# Patient Record
Sex: Female | Born: 1970 | Race: White | Hispanic: No | Marital: Single | Smoking: Never smoker
Health system: Southern US, Community
[De-identification: ages and names within clinical notes are randomized; demographics above are authoritative.]

## PROBLEM LIST (undated history)

## (undated) DIAGNOSIS — N200 Calculus of kidney: Secondary | ICD-10-CM

## (undated) DIAGNOSIS — G2581 Restless legs syndrome: Secondary | ICD-10-CM

## (undated) DIAGNOSIS — M797 Fibromyalgia: Secondary | ICD-10-CM

## (undated) DIAGNOSIS — R112 Nausea with vomiting, unspecified: Secondary | ICD-10-CM

## (undated) DIAGNOSIS — R51 Headache: Secondary | ICD-10-CM

## (undated) DIAGNOSIS — Z8489 Family history of other specified conditions: Secondary | ICD-10-CM

## (undated) DIAGNOSIS — F32A Depression, unspecified: Secondary | ICD-10-CM

## (undated) DIAGNOSIS — K219 Gastro-esophageal reflux disease without esophagitis: Secondary | ICD-10-CM

## (undated) DIAGNOSIS — Z87442 Personal history of urinary calculi: Secondary | ICD-10-CM

## (undated) DIAGNOSIS — A048 Other specified bacterial intestinal infections: Secondary | ICD-10-CM

## (undated) DIAGNOSIS — Z9889 Other specified postprocedural states: Secondary | ICD-10-CM

## (undated) DIAGNOSIS — R519 Headache, unspecified: Secondary | ICD-10-CM

## (undated) DIAGNOSIS — E039 Hypothyroidism, unspecified: Secondary | ICD-10-CM

## (undated) DIAGNOSIS — N201 Calculus of ureter: Secondary | ICD-10-CM

## (undated) DIAGNOSIS — D649 Anemia, unspecified: Secondary | ICD-10-CM

## (undated) DIAGNOSIS — S069X9A Unspecified intracranial injury with loss of consciousness of unspecified duration, initial encounter: Secondary | ICD-10-CM

## (undated) DIAGNOSIS — F419 Anxiety disorder, unspecified: Secondary | ICD-10-CM

## (undated) DIAGNOSIS — R319 Hematuria, unspecified: Secondary | ICD-10-CM

## (undated) HISTORY — PX: CHOLECYSTECTOMY: SHX55

## (undated) HISTORY — PX: TONSILLECTOMY AND ADENOIDECTOMY: SUR1326

## (undated) HISTORY — PX: ABDOMINAL HYSTERECTOMY: SHX81

---

## 2010-07-19 HISTORY — PX: LAPAROSCOPIC GASTRIC BANDING: SHX1100

## 2012-09-18 HISTORY — PX: URETEROLITHOTOMY: SHX71

## 2013-01-16 HISTORY — PX: LAPAROSCOPIC ASSISTED VAGINAL HYSTERECTOMY: SHX5398

## 2013-03-25 ENCOUNTER — Other Ambulatory Visit: Payer: Self-pay | Admitting: Urology

## 2013-03-26 NOTE — H&P (Signed)
History of Present Illness   Morgan [name] presents today to establish as a new patient here for ongoing issues with nephrolithiasis. Morgan [name] is currently 42 years of age and tells me she estimates 50 clinical stone events in her life. She has required surgical intervention on 2 occasions. Her care initially was primarily in [place], [place] and more recently in [place]. The patient had developed some flank discomfort and hematuria in the fall 2013 and had a CT scan in [place], [place], which apparently showed some renal calculi. She continued to have pain and IVP in [place] revealed a 6 mm midureteral stone. She underwent holmium laser lithotripsy with ureteroscopy by Dr. Salvatore Decent in early November 2013. She had a stent placed, which was subsequently removed. She reports no prior history of any metabolic evaluation despite her young age and frequent stone formation. She tells me in the interim, she was having some pelvic discomfort and was diagnosed with a possible ovarian mass. She underwent a hysterectomy in [place], but no malignancy was noted. She also reports that she was told that she might have a "spot" on her kidney through Dr. Suanne Marker office, but I could find no reports or mention of any concerning renal cyst or renal masses on any notes that have been provided to [place]. The patient tells me she went the other night to the [facility] emergency room having nonspecific suprapubic pressure and discomfort as well as some nonspecific right lower back discomfort. No imaging studies were done at that time. Her urinalysis today does show a small degree of microhematuria and a small amount of pyuria. She continues to have some urgency, frequency, and pressure in her bladder.        Past Medical History Problems  1. History of  Heartburn 787.1 2. History of  Hypothyroidism 244.9  Current Meds 1. HYDROmorphone HCl 2 MG Oral Tablet; Therapy: (Recorded:08May2014) to 2. Synthroid 200 MCG Oral  Tablet; Therapy: (Recorded:08May2014) to 3. Tamsulosin HCl 0.4 MG Oral Capsule; Therapy: (Recorded:08May2014) to  Allergies Medication  1. Clindamycin HCl CAPS 2. Percocet TABS  Family History Problems  1. Maternal history of  Blood In Urine 2. Maternal history of  Cancer 3. Maternal history of  Death In The Family Mother 9yrs, cancer 4. Maternal history of  Diabetes Mellitus V18.0 5. Paternal history of  Family Health Status - Father's Age 67yrs 6. Family history of  Family Health Status Number Of Children 1 daughter 43. Paternal history of  Heart Disease V17.49 8. Maternal history of  Hypertension V17.49 9. Paternal history of  Hypertension V17.49 10. Maternal history of  Nephrolithiasis 11. Maternal history of  Renal Failure  Social History Problems    Caffeine Use 1-2 qd   Marital History - Currently Married   Never A Smoker   Occupation: Public relations account executive Denied    History of  Alcohol Use   History of  Tobacco Use  Review of Systems Genitourinary, constitutional, skin, eye, otolaryngeal, hematologic/lymphatic, cardiovascular, pulmonary, endocrine, musculoskeletal, gastrointestinal, neurological and psychiatric system(s) were reviewed and pertinent findings if present are noted.  Genitourinary: urinary frequency, urinary urgency, pelvic pain and suprapubic pain, but no hematuria.  Gastrointestinal: nausea and constipation.  Constitutional: night sweats.  ENT: sore throat and sinus problems.  Musculoskeletal: back pain.    Vitals Vital Signs [Data Includes: Last 1 Day]  08May2014 09:45AM  BMI Calculated: 37.53 BSA Calculated: 2.1 Height: 5 ft 5.5 in Weight: 228 lb  Blood Pressure: 109 / 64 Temperature: 97.6 F Heart Rate: 89  Physical Exam Constitutional: Well nourished and well developed . No acute distress.  ENT:. The ears and nose are normal in appearance.  Neck: The appearance of the neck is normal and no neck mass is present.  Pulmonary: No  respiratory distress and normal respiratory rhythm and effort.  Cardiovascular: Heart rate and rhythm are normal . No peripheral edema.  Abdomen: The abdomen is soft and nontender. No masses are palpated. No CVA tenderness. No hernias are palpable. No hepatosplenomegaly noted.  Skin: Normal skin turgor, no visible rash and no visible skin lesions.  Neuro/Psych:. Mood and affect are appropriate.    Results/Data Urine [Data Includes: Last 1 Day]   08May2014  COLOR YELLOW   APPEARANCE CLEAR   SPECIFIC GRAVITY 1.025   pH 6.0   GLUCOSE NEG mg/dL  BILIRUBIN NEG   KETONE NEG mg/dL  BLOOD MOD   PROTEIN NEG mg/dL  UROBILINOGEN 0.2 mg/dL  NITRITE NEG   LEUKOCYTE ESTERASE TRACE   SQUAMOUS EPITHELIAL/HPF FEW   WBC 3-6 WBC/hpf  RBC 3-6 RBC/hpf  BACTERIA RARE   CRYSTALS NONE SEEN   CASTS NONE SEEN   Other MUCUS    Assessment Assessed  1. Nephrolithiasis 592.0 2. Microscopic Hematuria 599.72 3. 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 Ureteral Stone 592.1  Plan Hyperactivity Of The Bladder (596.51)  1. AU CT-STONE PROTOCOL  Done: 08May2014 12:00AM  Discussion/Summary   Morgan [name] appears to have a 4-5 mm distal right ureteral calculus on stone protocol imaging today. The official report remains pending, but I have reviewed the films. There appears to be mild obstruction. In addition, she appears to have a tiny calculus in both kidneys that appears to be no bigger than 1-2 mm. She has no immediate indication for intervention. She says that she had fairly significant discomfort last night. We will give her a Toradol shot today since she is having ongoing discomfort. She has been started on Flomax and we will make sure that she has adequate Flomax and pain medication set up until she can achieve followup here. I would encourage her to try to pass the stones spontaneously, but would be happy to intervene if she does not do better or if her clinical situation worsens. Given the current size and  location of the stone, I would recommend ureteroscopy with stone basketing or potential holmium laser lithotripsy with or without stent, depending on how things go. I do think, given her history, it is imperative that she have some metabolic studies once the acute stone event is over. I will set her up to see Lyla Son in the next 1-2 weeks to get followup and see how she is doing clinically. We will then set her up for surgery if needed. I have also provided her with some VESIcare samples to help with some of the suprapubic pressure.      Amendment   After additional discussion, Morgan [name] would really prefer to have something done sooner rather than later. My next availability will be sometime next week. We will tentatively put her on the schedule for cysto with retrograde pyelogram, ureteroscopy and stone basketing or lasering of the stone. If she passes the stone between now and then, we would be delighted to cancel her procedure and I would encourage her to strain her urine faithfully so that we know to cancel the procedure.        Signatures Electronically signed by : Barron Alvine, M.D.; Mar 26 2013  8:16AM

## 2013-03-29 ENCOUNTER — Encounter (HOSPITAL_BASED_OUTPATIENT_CLINIC_OR_DEPARTMENT_OTHER): Payer: Self-pay | Admitting: *Deleted

## 2013-03-29 NOTE — Progress Notes (Signed)
NPO AFTER MN. ARRIVES AT 0845. NEEDS HG. WILL TAKE SYNTHROID AM OF SURG W/ SIP OF WATER AND IF NEEDED TAKE DILAUDID.

## 2013-03-31 ENCOUNTER — Ambulatory Visit (HOSPITAL_BASED_OUTPATIENT_CLINIC_OR_DEPARTMENT_OTHER): Payer: BC Managed Care – PPO | Admitting: Anesthesiology

## 2013-03-31 ENCOUNTER — Ambulatory Visit (HOSPITAL_BASED_OUTPATIENT_CLINIC_OR_DEPARTMENT_OTHER)
Admission: RE | Admit: 2013-03-31 | Discharge: 2013-03-31 | Disposition: A | Discharge status: Home / Self Care | Payer: BC Managed Care – PPO | Source: Ambulatory Visit | Attending: Urology | Admitting: Urology

## 2013-03-31 ENCOUNTER — Encounter (HOSPITAL_BASED_OUTPATIENT_CLINIC_OR_DEPARTMENT_OTHER): Payer: Self-pay

## 2013-03-31 ENCOUNTER — Encounter (HOSPITAL_COMMUNITY): Payer: Self-pay | Admitting: *Deleted

## 2013-03-31 ENCOUNTER — Emergency Department (HOSPITAL_COMMUNITY)
Admission: EM | Admit: 2013-03-31 | Discharge: 2013-03-31 | Disposition: A | Discharge status: Home / Self Care | Payer: BC Managed Care – PPO | Attending: Emergency Medicine | Admitting: Emergency Medicine

## 2013-03-31 ENCOUNTER — Encounter (HOSPITAL_BASED_OUTPATIENT_CLINIC_OR_DEPARTMENT_OTHER): Payer: Self-pay | Admitting: Anesthesiology

## 2013-03-31 ENCOUNTER — Encounter (HOSPITAL_BASED_OUTPATIENT_CLINIC_OR_DEPARTMENT_OTHER)
Admission: RE | Disposition: A | Discharge status: Home / Self Care | Payer: Self-pay | Source: Ambulatory Visit | Attending: Urology

## 2013-03-31 DIAGNOSIS — K219 Gastro-esophageal reflux disease without esophagitis: Secondary | ICD-10-CM | POA: Insufficient documentation

## 2013-03-31 DIAGNOSIS — G2581 Restless legs syndrome: Secondary | ICD-10-CM | POA: Insufficient documentation

## 2013-03-31 DIAGNOSIS — R82998 Other abnormal findings in urine: Secondary | ICD-10-CM | POA: Insufficient documentation

## 2013-03-31 DIAGNOSIS — Z87442 Personal history of urinary calculi: Secondary | ICD-10-CM | POA: Insufficient documentation

## 2013-03-31 DIAGNOSIS — Z90710 Acquired absence of both cervix and uterus: Secondary | ICD-10-CM | POA: Insufficient documentation

## 2013-03-31 DIAGNOSIS — K5900 Constipation, unspecified: Secondary | ICD-10-CM | POA: Insufficient documentation

## 2013-03-31 DIAGNOSIS — E039 Hypothyroidism, unspecified: Secondary | ICD-10-CM | POA: Insufficient documentation

## 2013-03-31 DIAGNOSIS — Z9089 Acquired absence of other organs: Secondary | ICD-10-CM | POA: Insufficient documentation

## 2013-03-31 DIAGNOSIS — R12 Heartburn: Secondary | ICD-10-CM | POA: Insufficient documentation

## 2013-03-31 DIAGNOSIS — N23 Unspecified renal colic: Secondary | ICD-10-CM | POA: Insufficient documentation

## 2013-03-31 DIAGNOSIS — R3129 Other microscopic hematuria: Secondary | ICD-10-CM | POA: Insufficient documentation

## 2013-03-31 DIAGNOSIS — R3915 Urgency of urination: Secondary | ICD-10-CM | POA: Insufficient documentation

## 2013-03-31 DIAGNOSIS — N200 Calculus of kidney: Secondary | ICD-10-CM | POA: Insufficient documentation

## 2013-03-31 DIAGNOSIS — M545 Low back pain, unspecified: Secondary | ICD-10-CM | POA: Insufficient documentation

## 2013-03-31 DIAGNOSIS — N201 Calculus of ureter: Secondary | ICD-10-CM

## 2013-03-31 DIAGNOSIS — R350 Frequency of micturition: Secondary | ICD-10-CM | POA: Insufficient documentation

## 2013-03-31 DIAGNOSIS — Z9884 Bariatric surgery status: Secondary | ICD-10-CM | POA: Insufficient documentation

## 2013-03-31 DIAGNOSIS — Z9889 Other specified postprocedural states: Secondary | ICD-10-CM | POA: Insufficient documentation

## 2013-03-31 DIAGNOSIS — N318 Other neuromuscular dysfunction of bladder: Secondary | ICD-10-CM | POA: Insufficient documentation

## 2013-03-31 DIAGNOSIS — Z79899 Other long term (current) drug therapy: Secondary | ICD-10-CM | POA: Insufficient documentation

## 2013-03-31 HISTORY — PX: HOLMIUM LASER APPLICATION: SHX5852

## 2013-03-31 HISTORY — DX: Gastro-esophageal reflux disease without esophagitis: K21.9

## 2013-03-31 HISTORY — DX: Restless legs syndrome: G25.81

## 2013-03-31 HISTORY — PX: CYSTOSCOPY/RETROGRADE/URETEROSCOPY: SHX5316

## 2013-03-31 HISTORY — DX: Hypothyroidism, unspecified: E03.9

## 2013-03-31 HISTORY — DX: Other specified postprocedural states: R11.2

## 2013-03-31 HISTORY — DX: Personal history of urinary calculi: Z87.442

## 2013-03-31 HISTORY — DX: Calculus of ureter: N20.1

## 2013-03-31 HISTORY — DX: Calculus of kidney: N20.0

## 2013-03-31 HISTORY — DX: Other specified postprocedural states: Z98.890

## 2013-03-31 HISTORY — DX: Hematuria, unspecified: R31.9

## 2013-03-31 LAB — POCT HEMOGLOBIN-HEMACUE: Hemoglobin: 13.6 g/dL (ref 12.0–15.0)

## 2013-03-31 SURGERY — CYSTOSCOPY/RETROGRADE/URETEROSCOPY
Anesthesia: General | Site: Ureter | Laterality: Right | Wound class: Clean Contaminated

## 2013-03-31 MED ORDER — HYDROMORPHONE HCL 2 MG PO TABS: 2.0000 mg | ORAL_TABLET | ORAL | Status: DC | PRN

## 2013-03-31 MED ORDER — URIBEL 118 MG PO CAPS: 1.0000 | ORAL_CAPSULE | Freq: Three times a day (TID) | ORAL | Status: DC | PRN

## 2013-03-31 MED ORDER — DEXAMETHASONE SODIUM PHOSPHATE 4 MG/ML IJ SOLN
INTRAMUSCULAR | Status: DC | PRN
  Administered 2013-03-31: 10 mg via INTRAVENOUS

## 2013-03-31 MED ORDER — HYDROMORPHONE HCL PF 1 MG/ML IJ SOLN
1.0000 mg | Freq: Once | INTRAMUSCULAR | Status: AC
  Administered 2013-03-31: 1 mg via INTRAVENOUS
  Filled 2013-03-31: qty 1

## 2013-03-31 MED ORDER — SCOPOLAMINE 1 MG/3DAYS TD PT72
1.0000 | MEDICATED_PATCH | TRANSDERMAL | Status: DC
  Administered 2013-03-31: 1.5 mg via TRANSDERMAL
  Filled 2013-03-31: qty 1

## 2013-03-31 MED ORDER — KETOROLAC TROMETHAMINE 30 MG/ML IJ SOLN
INTRAMUSCULAR | Status: DC | PRN
  Administered 2013-03-31: 30 mg via INTRAVENOUS

## 2013-03-31 MED ORDER — URELLE 81 MG PO TABS
1.0000 | ORAL_TABLET | Freq: Four times a day (QID) | ORAL | Status: DC
  Administered 2013-03-31: 81 mg via ORAL
  Filled 2013-03-31: qty 1

## 2013-03-31 MED ORDER — SODIUM CHLORIDE 0.9 % IR SOLN
Status: DC | PRN
  Administered 2013-03-31: 6000 mL

## 2013-03-31 MED ORDER — FENTANYL CITRATE 0.05 MG/ML IJ SOLN
INTRAMUSCULAR | Status: DC | PRN
  Administered 2013-03-31: 100 ug via INTRAVENOUS
  Administered 2013-03-31: 50 ug via INTRAVENOUS

## 2013-03-31 MED ORDER — CEFAZOLIN SODIUM 1-5 GM-% IV SOLN
1.0000 g | INTRAVENOUS | Status: DC
  Filled 2013-03-31: qty 50

## 2013-03-31 MED ORDER — HYDROMORPHONE HCL 2 MG PO TABS
2.0000 mg | ORAL_TABLET | Freq: Four times a day (QID) | ORAL | Status: DC | PRN
  Administered 2013-03-31: 2 mg via ORAL
  Filled 2013-03-31: qty 1

## 2013-03-31 MED ORDER — LIDOCAINE HCL (CARDIAC) 20 MG/ML IV SOLN
INTRAVENOUS | Status: DC | PRN
  Administered 2013-03-31: 80 mg via INTRAVENOUS

## 2013-03-31 MED ORDER — CEFAZOLIN SODIUM-DEXTROSE 2-3 GM-% IV SOLR
2.0000 g | INTRAVENOUS | Status: AC
  Administered 2013-03-31: 2 g via INTRAVENOUS
  Filled 2013-03-31: qty 50

## 2013-03-31 MED ORDER — FENTANYL CITRATE 0.05 MG/ML IJ SOLN
25.0000 ug | INTRAMUSCULAR | Status: DC | PRN
  Administered 2013-03-31: 25 ug via INTRAVENOUS
  Administered 2013-03-31: 50 ug via INTRAVENOUS
  Filled 2013-03-31: qty 1

## 2013-03-31 MED ORDER — DIAZEPAM 5 MG/ML IJ SOLN
2.5000 mg | Freq: Once | INTRAMUSCULAR | Status: AC
  Administered 2013-03-31: 2.5 mg via INTRAVENOUS
  Filled 2013-03-31: qty 2

## 2013-03-31 MED ORDER — IOHEXOL 350 MG/ML SOLN
INTRAVENOUS | Status: DC | PRN
  Administered 2013-03-31: 10 mL

## 2013-03-31 MED ORDER — LACTATED RINGERS IV SOLN
INTRAVENOUS | Status: DC
  Administered 2013-03-31 (×2): via INTRAVENOUS
  Filled 2013-03-31: qty 1000

## 2013-03-31 MED ORDER — DIAZEPAM 5 MG PO TABS: 5.0000 mg | ORAL_TABLET | Freq: Four times a day (QID) | ORAL | Status: DC | PRN

## 2013-03-31 MED ORDER — PROMETHAZINE HCL 25 MG/ML IJ SOLN
6.2500 mg | INTRAMUSCULAR | Status: DC | PRN
  Filled 2013-03-31: qty 1

## 2013-03-31 MED ORDER — LIDOCAINE HCL 2 % EX GEL
CUTANEOUS | Status: DC | PRN
  Administered 2013-03-31: 1 via URETHRAL

## 2013-03-31 MED ORDER — ONDANSETRON HCL 4 MG/2ML IJ SOLN
INTRAMUSCULAR | Status: DC | PRN
  Administered 2013-03-31: 4 mg via INTRAVENOUS

## 2013-03-31 MED ORDER — MEPERIDINE HCL 25 MG/ML IJ SOLN
6.2500 mg | INTRAMUSCULAR | Status: DC | PRN
  Filled 2013-03-31: qty 1

## 2013-03-31 MED ORDER — MIDAZOLAM HCL 5 MG/5ML IJ SOLN
INTRAMUSCULAR | Status: DC | PRN
  Administered 2013-03-31: 2 mg via INTRAVENOUS

## 2013-03-31 MED ORDER — PROPOFOL 10 MG/ML IV BOLUS
INTRAVENOUS | Status: DC | PRN
  Administered 2013-03-31: 50 mg via INTRAVENOUS
  Administered 2013-03-31: 200 mg via INTRAVENOUS

## 2013-03-31 MED ORDER — LACTATED RINGERS IV SOLN
INTRAVENOUS | Status: DC
  Filled 2013-03-31: qty 1000

## 2013-03-31 MED ORDER — KETOROLAC TROMETHAMINE 30 MG/ML IJ SOLN
30.0000 mg | Freq: Once | INTRAMUSCULAR | Status: AC
  Administered 2013-03-31: 30 mg via INTRAVENOUS
  Filled 2013-03-31: qty 1

## 2013-03-31 SURGICAL SUPPLY — 31 items
ADAPTER CATH URET PLST 4-6FR (CATHETERS) IMPLANT
BAG DRAIN URO-CYSTO SKYTR STRL (DRAIN) ×2 IMPLANT
BASKET LASER NITINOL 1.9FR (BASKET) IMPLANT
BASKET STNLS GEMINI 4WIRE 3FR (BASKET) IMPLANT
BASKET ZERO TIP NITINOL 2.4FR (BASKET) ×2 IMPLANT
BENZOIN TINCTURE PRP APPL 2/3 (GAUZE/BANDAGES/DRESSINGS) IMPLANT
CANISTER SUCT LVC 12 LTR MEDI- (MISCELLANEOUS) ×2 IMPLANT
CATH INTERMIT  6FR 70CM (CATHETERS) IMPLANT
CATH URET 5FR 28IN CONE TIP (BALLOONS)
CATH URET 5FR 28IN OPEN ENDED (CATHETERS) IMPLANT
CATH URET 5FR 70CM CONE TIP (BALLOONS) IMPLANT
CLOTH BEACON ORANGE TIMEOUT ST (SAFETY) ×2 IMPLANT
DRAPE CAMERA CLOSED 9X96 (DRAPES) ×2 IMPLANT
DRSG TEGADERM 2-3/8X2-3/4 SM (GAUZE/BANDAGES/DRESSINGS) IMPLANT
GLOVE BIO SURGEON STRL SZ7.5 (GLOVE) ×2 IMPLANT
GOWN STRL NON-REIN LRG LVL3 (GOWN DISPOSABLE) ×2 IMPLANT
GOWN STRL REIN XL XLG (GOWN DISPOSABLE) ×2 IMPLANT
GUIDEWIRE 0.038 PTFE COATED (WIRE) IMPLANT
GUIDEWIRE ANG ZIPWIRE 038X150 (WIRE) IMPLANT
GUIDEWIRE STR DUAL SENSOR (WIRE) IMPLANT
IV NS IRRIG 3000ML ARTHROMATIC (IV SOLUTION) ×4 IMPLANT
KIT BALLIN UROMAX 15FX10 (LABEL) IMPLANT
KIT BALLN UROMAX 15FX4 (MISCELLANEOUS) IMPLANT
KIT BALLN UROMAX 26 75X4 (MISCELLANEOUS)
LASER FIBER DISP (UROLOGICAL SUPPLIES) ×2 IMPLANT
LASER FIBER DISP 1000U (UROLOGICAL SUPPLIES) IMPLANT
NS IRRIG 500ML POUR BTL (IV SOLUTION) IMPLANT
PACK CYSTOSCOPY (CUSTOM PROCEDURE TRAY) ×2 IMPLANT
SET HIGH PRES BAL DIL (LABEL)
SHEATH ACCESS URETERAL 38CM (SHEATH) IMPLANT
SHEATH ACCESS URETERAL 54CM (SHEATH) IMPLANT

## 2013-03-31 NOTE — H&P (Signed)
History of Present Illness   Morgan [name] presents today to establish as a new patient here for ongoing issues with nephrolithiasis. Morgan [name] is currently 42 years of age and tells me she estimates 50 clinical stone events in her life. She has required surgical intervention on 2 occasions. Her care initially was primarily in [place], [place] and more recently in [place]. The patient had developed some flank discomfort and hematuria in the fall 2013 and had a CT scan in [place], [place], which apparently showed some renal calculi. She continued to have pain and IVP in [place] revealed a 6 mm midureteral stone. She underwent holmium laser lithotripsy with ureteroscopy by Dr. Caberwal in early November 2013. She had a stent placed, which was subsequently removed. She reports no prior history of any metabolic evaluation despite her young age and frequent stone formation. She tells me in the interim, she was having some pelvic discomfort and was diagnosed with a possible ovarian mass. She underwent a hysterectomy in [place], but no malignancy was noted. She also reports that she was told that she might have a "spot" on her kidney through Dr. Caberwal's office, but I could find no reports or mention of any concerning renal cyst or renal masses on any notes that have been provided to us. The patient tells me she went the other night to the [facility] emergency room having nonspecific suprapubic pressure and discomfort as well as some nonspecific right lower back discomfort. No imaging studies were done at that time. Her urinalysis today does show a small degree of microhematuria and a small amount of pyuria. She continues to have some urgency, frequency, and pressure in her bladder.        Past Medical History Problems  1. History of  Heartburn 787.1 2. History of  Hypothyroidism 244.9  Current Meds 1. HYDROmorphone HCl 2 MG Oral Tablet; Therapy: (Recorded:08May2014) to 2. Synthroid 200 MCG Oral  Tablet; Therapy: (Recorded:08May2014) to 3. Tamsulosin HCl 0.4 MG Oral Capsule; Therapy: (Recorded:08May2014) to  Allergies Medication  1. Clindamycin HCl CAPS 2. Percocet TABS  Family History Problems  1. Maternal history of  Blood In Urine 2. Maternal history of  Cancer 3. Maternal history of  Death In The Family Mother 57yrs, cancer 4. Maternal history of  Diabetes Mellitus V18.0 5. Paternal history of  Family Health Status - Father's Age 72yrs 6. Family history of  Family Health Status Number Of Children 1 daughter 7. Paternal history of  Heart Disease V17.49 8. Maternal history of  Hypertension V17.49 9. Paternal history of  Hypertension V17.49 10. Maternal history of  Nephrolithiasis 11. 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  Genitourinary: urinary frequency, urinary urgency, pelvic pain and suprapubic pain, but no hematuria.  Gastrointestinal: nausea and constipation.  Constitutional: night sweats.  ENT: sore throat and sinus problems.  Musculoskeletal: back pain.    Vitals Vital Signs [Data Includes: Last 1 Day]  08May2014 09:45AM  BMI Calculated: 37.53 BSA Calculated: 2.1 Height: 5 ft 5.5 in Weight: 228 lb  Blood Pressure: 109 / 64 Temperature: 97.6 F Heart Rate: 89    Physical Exam Constitutional: Well nourished and well developed . No acute distress.  ENT:. The ears and nose are normal in appearance.  Neck: The appearance of the neck is normal and no neck mass is present.  Pulmonary: No  respiratory distress and normal respiratory rhythm and effort.  Cardiovascular: Heart rate and rhythm are normal . No peripheral edema.  Abdomen: The abdomen is soft and nontender. No masses are palpated. No CVA tenderness. No hernias are palpable. No hepatosplenomegaly noted.  Skin: Normal skin turgor, no visible rash and no visible skin lesions.  Neuro/Psych:. Mood and affect are appropriate.    Results/Data Urine [Data Includes: Last 1 Day]   08May2014  COLOR YELLOW   APPEARANCE CLEAR   SPECIFIC GRAVITY 1.025   pH 6.0   GLUCOSE NEG mg/dL  BILIRUBIN NEG   KETONE NEG mg/dL  BLOOD MOD   PROTEIN NEG mg/dL  UROBILINOGEN 0.2 mg/dL  NITRITE NEG   LEUKOCYTE ESTERASE TRACE   SQUAMOUS EPITHELIAL/HPF FEW   WBC 3-6 WBC/hpf  RBC 3-6 RBC/hpf  BACTERIA RARE   CRYSTALS NONE SEEN   CASTS NONE SEEN   Other MUCUS    Assessment Assessed  1. Nephrolithiasis 592.0 2. Microscopic Hematuria 599.72 3. 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 Ureteral Stone 592.1  Plan Hyperactivity Of The Bladder (596.51)  1. AU CT-STONE PROTOCOL  Done: 08May2014 12:00AM  Discussion/Summary   Morgan [name] appears to have a 4-5 mm distal right ureteral calculus on stone protocol imaging today. The official report remains pending, but I have reviewed the films. There appears to be mild obstruction. In addition, she appears to have a tiny calculus in both kidneys that appears to be no bigger than 1-2 mm. She has no immediate indication for intervention. She says that she had fairly significant discomfort last night. We will give her a Toradol shot today since she is having ongoing discomfort. She has been started on Flomax and we will make sure that she has adequate Flomax and pain medication set up until she can achieve followup here. I would encourage her to try to pass the stones spontaneously, but would be happy to intervene if she does not do better or if her clinical situation worsens. Given the current size and  location of the stone, I would recommend ureteroscopy with stone basketing or potential holmium laser lithotripsy with or without stent, depending on how things go. I do think, given her history, it is imperative that she have some metabolic studies once the acute stone event is over. I will set her up to see Carrie in the next 1-2 weeks to get followup and see how she is doing clinically. We will then set her up for surgery if needed. I have also provided her with some VESIcare samples to help with some of the suprapubic pressure.      Amendment   After additional discussion, Cherrell would really prefer to have something done sooner rather than later. My next availability will be sometime next week. We will tentatively put her on the schedule for cysto with retrograde pyelogram, ureteroscopy and stone basketing or lasering of the stone. If she passes the stone between now and then, we would be delighted to cancel her procedure and I would encourage her to strain her urine faithfully so that we know to cancel the procedure.        Signatures Electronically signed by : Zailee Vallely, M.D.; Mar 26 2013  8:16AM   

## 2013-03-31 NOTE — Transfer of Care (Signed)
Immediate Anesthesia Transfer of Care Note  Patient: Morgan [name]  Procedure(s) Performed: Procedure(s): CYSTOSCOPY/RETROGRADE/URETEROSCOPY (Right) HOLMIUM LASER APPLICATION (Right)  Patient Location: PACU  Anesthesia Type:General  Level of Consciousness: sedated  Airway & Oxygen Therapy: Patient Spontanous Breathing and Patient connected to face mask oxygen  Post-op Assessment: Report given to PACU RN and Post -op Vital signs reviewed and stable  Post vital signs: Reviewed and stable  Complications: No apparent anesthesia complications

## 2013-03-31 NOTE — ED Notes (Signed)
Pt states had kidney stone surgery today, has been in severe pain since, states has taken 6mg  dilaudid since 12 pm today, having back pain and lower abdominal pain, called doctor's office and was told to come here.

## 2013-03-31 NOTE — Anesthesia Postprocedure Evaluation (Signed)
  Anesthesia Post-op Note  Patient: Morgan [name]  Procedure(s) Performed: Procedure(s) (LRB): CYSTOSCOPY/RETROGRADE/URETEROSCOPY (Right) HOLMIUM LASER APPLICATION (Right)  Patient Location: PACU  Anesthesia Type: General  Level of Consciousness: awake and alert   Airway and Oxygen Therapy: Patient Spontanous Breathing  Post-op Pain: mild  Post-op Assessment: Post-op Vital signs reviewed, Patient's Cardiovascular Status Stable, Respiratory Function Stable, Patent Airway and No signs of Nausea or vomiting  Last Vitals:  Filed Vitals:   03/31/13 1115  BP: 118/91  Pulse: 79  Temp:   Resp: 13    Post-op Vital Signs: stable   Complications: No apparent anesthesia complications

## 2013-03-31 NOTE — Anesthesia Preprocedure Evaluation (Addendum)
Anesthesia Evaluation  Patient identified by MRN, date of birth, ID band Patient awake    Reviewed: Allergy & Precautions, H&P , NPO status , Patient's Chart, lab work & pertinent test results  History of Anesthesia Complications (+) PONV  Airway Mallampati: II TM Distance: >3 FB Neck ROM: Full    Dental no notable dental hx.    Pulmonary neg pulmonary ROS,  breath sounds clear to auscultation  Pulmonary exam normal       Cardiovascular negative cardio ROS  Rhythm:Regular Rate:Normal     Neuro/Psych negative neurological ROS  negative psych ROS   GI/Hepatic negative GI ROS, Neg liver ROS,   Endo/Other  negative endocrine ROS  Renal/GU negative Renal ROS  negative genitourinary   Musculoskeletal negative musculoskeletal ROS (+)   Abdominal   Peds negative pediatric ROS (+)  Hematology negative hematology ROS (+)   Anesthesia Other Findings   Reproductive/Obstetrics negative OB ROS                           Anesthesia Physical Anesthesia Plan  ASA: II  Anesthesia Plan: General   Post-op Pain Management:    Induction: Intravenous  Airway Management Planned: LMA  Additional Equipment:   Intra-op Plan:   Post-operative Plan: Extubation in OR  Informed Consent: I have reviewed the patients History and Physical, chart, labs and discussed the procedure including the risks, benefits and alternatives for the proposed anesthesia with the patient or authorized representative who has indicated his/her understanding and acceptance.   Dental advisory given  Plan Discussed with: CRNA  Anesthesia Plan Comments:         Anesthesia Quick Evaluation  

## 2013-03-31 NOTE — ED Provider Notes (Addendum)
History     CSN: [id number]  Arrival date & time 03/31/13  1634   First MD Initiated Contact with Patient 03/31/13 1657      Chief Complaint  Patient presents with  . Abdominal Pain  . Back Pain    (Consider location/radiation/quality/duration/timing/severity/associated sxs/prior treatment) Patient is a 42 y.o. female presenting with abdominal pain and back pain. The history is provided by the patient.  Abdominal Pain Associated symptoms: no chest pain, no diarrhea, no nausea, no shortness of breath and no vomiting   Back Pain Associated symptoms: abdominal pain   Associated symptoms: no chest pain, no headaches, no numbness and no weakness    patient had lithotripsy earlier today by Dr. Isabel Caprice. She states that she has had pain before the procedure. She states she's having severe pain also after. She states that it got better briefly but then returned on the car ride home. She states is the same type of pain she was having before. It is crampy in the lower abdomen and back. She states the pain is 50 out of 10. No dysuria. Patient states she has to urinate more frequently. Operative note was reviewed and appears to be no complications. No fevers.  Past Medical History  Diagnosis Date  . Right ureteral stone   . RLS (restless legs syndrome)   . Acid reflux   . Constipation PT STATES HAS A TEAR AT RECTAL AREA  . Frequency of urination   . Urgency of urination   . Hematuria   . Renal calculi BILATERAL  . History of kidney stones   . PONV (postoperative nausea and vomiting)   . Hypothyroidism     Past Surgical History  Procedure Laterality Date  . Laparoscopic assisted vaginal hysterectomy  MARCH 2014    W/ BILATERAL SALPINGOOPHORECTOMY  . Cholecystectomy    . Laparoscopic gastric banding  SEPT 2011  . Tonsillectomy and adenoidectomy  AGE 57'S  . Ureterolithotomy  NOV 2013    No family history on file.  History  Substance Use Topics  . Smoking status: Never Smoker   .  Smokeless tobacco: Never Used  . Alcohol Use: No    OB History   Grav Para Term Preterm Abortions TAB SAB Ect Mult Living                  Review of Systems  Constitutional: Negative for activity change and appetite change.  HENT: Negative for neck stiffness.   Eyes: Negative for pain.  Respiratory: Negative for chest tightness and shortness of breath.   Cardiovascular: Negative for chest pain and leg swelling.  Gastrointestinal: Positive for abdominal pain. Negative for nausea, vomiting and diarrhea.  Genitourinary: Positive for frequency. Negative for flank pain.  Musculoskeletal: Positive for back pain.  Skin: Negative for rash.  Neurological: Negative for weakness, numbness and headaches.  Psychiatric/Behavioral: Negative for behavioral problems.    Allergies  Clindamycin/lincomycin and Percocet  Home Medications   Current Outpatient Rx  Name  Route  Sig  Dispense  Refill  . HYDROmorphone (DILAUDID) 2 MG tablet   Oral   Take 2 mg by mouth every 4 (four) hours as needed for pain.         Marland Kitchen levothyroxine (SYNTHROID, LEVOTHROID) 200 MCG tablet   Oral   Take 200 mcg by mouth daily before breakfast.         . Meth-Hyo-M Bl-Na Phos-Ph Sal (URIBEL) 118 MG CAPS   Oral   Take 1 capsule (118 mg total)  by mouth 3 (three) times daily as needed.   15 capsule   1   . OVER THE COUNTER MEDICATION      at bedtime. RESTLESS LEG SUPP. OTC [employer] BRAND         . tamsulosin (FLOMAX) 0.4 MG CAPS   Oral   Take 0.4 mg by mouth daily.         . diazepam (VALIUM) 5 MG tablet   Oral   Take 1 tablet (5 mg total) by mouth every 6 (six) hours as needed (spasm).   10 tablet   0   . HYDROmorphone (DILAUDID) 2 MG tablet   Oral   Take 1 tablet (2 mg total) by mouth every 4 (four) hours as needed for pain.   10 tablet   0     BP 125/79  Pulse 84  Temp(Src) 98.4 F (36.9 C) (Oral)  Resp 18  SpO2 92%  Physical Exam  Nursing note and vitals  reviewed. Constitutional: She is oriented to person, place, and time. She appears well-developed and well-nourished.  HENT:  Head: Normocephalic and atraumatic.  Eyes: EOM are normal. Pupils are equal, round, and reactive to light.  Neck: Normal range of motion. Neck supple.  Cardiovascular: Normal rate, regular rhythm and normal heart sounds.   No murmur heard. Pulmonary/Chest: Effort normal and breath sounds normal. No respiratory distress. She has no wheezes. She has no rales.  Abdominal: Soft. Bowel sounds are normal. She exhibits no distension. There is tenderness. There is no rebound and no guarding.  Tenderness in right lower abdomen without rebound or guarding.  Genitourinary:  Tenderness in right lower back.  Musculoskeletal: Normal range of motion.  Neurological: She is alert and oriented to person, place, and time. No cranial nerve deficit.  Skin: Skin is warm and dry.  Psychiatric: She has a normal mood and affect. Her speech is normal.    ED Course  Procedures (including critical care time)  Labs Reviewed - No data to display No results found.   1. Ureteral colic       MDM  Patient with flank pain after lithotripsy. OR note reviewed. No stent placed. Discussed with urology. Patient was treated with pain medicine and Valium. She feels better and will be discharged home. Likely post procedure spasms.        Juliet Rude. Rubin Payor, MD 03/31/13 1610  Juliet Rude. Rubin Payor, MD 03/31/13 [phone]

## 2013-03-31 NOTE — Op Note (Signed)
Preoperative diagnosis: right ureteral calculus  Postoperative diagnosis:  right  ureteral calculus  Procedure:  1. Cystoscopy 2.  right  ureteroscopy and stone removal 3. Ureteroscopic laser lithotripsy 4.  right  retrograde pyelography with interpretation  Surgeon: Valetta Fuller, MD  Anesthesia: General  Complications: None  Intraoperative findings:  right  retrograde pyelography demonstrated a filling defect within the  right  ureter consistent with the patient's known calculus without other abnormalities.  EBL: Minimal  Specimens: 1.  right  ureteral calculus  Disposition of specimens: [facility] for stone analysis  Indication: Morgan [name] is a 42 y.o.   patient with urolithiasis. After reviewing the management options for treatment, the patient elected to proceed with the above surgical procedure(s). We have discussed the potential benefits and risks of the procedure, side effects of the proposed treatment, the likelihood of the patient achieving the goals of the procedure, and any potential problems that might occur during the procedure or recuperation. Informed consent has been obtained.  Description of procedure:  The patient was taken to the operating room and general anesthesia was induced.  The patient was placed in the dorsal lithotomy position, prepped and draped in the usual sterile fashion, and preoperative antibiotics were administered. A preoperative time-out was performed.   Cystourethroscopy was performed.  The bladder was then systematically examined in its entirety. There was no evidence for any bladder tumors, stones, or other mucosal pathology.    Attention then turned to the  right  ureteral orifice and a ureteral catheter was used to intubate the ureteral orifice.  Omnipaque contrast was injected through the ureteral catheter and a retrograde pyelogram was performed with findings as dictated above.  A 0.38 sensor guidewire was  then advanced up the  right  ureter into the renal pelvis under fluoroscopic guidance. The 6 Fr semirigid ureteroscope was then advanced into the ureter next to the guidewire and the calculus was identified.   The stone was then fragmented with the 365 micron holmium laser fiber on a setting of 0.5J and frequency of 8 Hz.   All stones were then flushed from the ureter.  Reinspection of the ureter revealed no remaining visible stones or fragments.   The bladder was then emptied and the procedure ended.  The patient appeared to tolerate the procedure well and without complications.  The patient was able to be awakened and transferred to the recovery unit in satisfactory condition.

## 2013-04-01 ENCOUNTER — Encounter (HOSPITAL_BASED_OUTPATIENT_CLINIC_OR_DEPARTMENT_OTHER): Payer: Self-pay | Admitting: Urology

## 2013-06-27 ENCOUNTER — Emergency Department (HOSPITAL_COMMUNITY): Payer: BC Managed Care – PPO

## 2013-06-27 ENCOUNTER — Encounter (HOSPITAL_COMMUNITY): Payer: Self-pay | Admitting: *Deleted

## 2013-06-27 ENCOUNTER — Emergency Department (HOSPITAL_COMMUNITY)
Admission: EM | Admit: 2013-06-27 | Discharge: 2013-06-27 | Disposition: A | Discharge status: Home / Self Care | Payer: BC Managed Care – PPO | Attending: Emergency Medicine | Admitting: Emergency Medicine

## 2013-06-27 DIAGNOSIS — R109 Unspecified abdominal pain: Secondary | ICD-10-CM

## 2013-06-27 DIAGNOSIS — Z8719 Personal history of other diseases of the digestive system: Secondary | ICD-10-CM | POA: Insufficient documentation

## 2013-06-27 DIAGNOSIS — Z90710 Acquired absence of both cervix and uterus: Secondary | ICD-10-CM | POA: Insufficient documentation

## 2013-06-27 DIAGNOSIS — Z9089 Acquired absence of other organs: Secondary | ICD-10-CM | POA: Insufficient documentation

## 2013-06-27 DIAGNOSIS — R1031 Right lower quadrant pain: Secondary | ICD-10-CM | POA: Insufficient documentation

## 2013-06-27 DIAGNOSIS — Z9889 Other specified postprocedural states: Secondary | ICD-10-CM | POA: Insufficient documentation

## 2013-06-27 DIAGNOSIS — Z87442 Personal history of urinary calculi: Secondary | ICD-10-CM | POA: Insufficient documentation

## 2013-06-27 DIAGNOSIS — Z79899 Other long term (current) drug therapy: Secondary | ICD-10-CM | POA: Insufficient documentation

## 2013-06-27 DIAGNOSIS — E039 Hypothyroidism, unspecified: Secondary | ICD-10-CM | POA: Insufficient documentation

## 2013-06-27 DIAGNOSIS — R110 Nausea: Secondary | ICD-10-CM | POA: Insufficient documentation

## 2013-06-27 DIAGNOSIS — Z8669 Personal history of other diseases of the nervous system and sense organs: Secondary | ICD-10-CM | POA: Insufficient documentation

## 2013-06-27 DIAGNOSIS — K219 Gastro-esophageal reflux disease without esophagitis: Secondary | ICD-10-CM | POA: Insufficient documentation

## 2013-06-27 LAB — COMPREHENSIVE METABOLIC PANEL
ALT: 22 U/L (ref 0–35)
Alkaline Phosphatase: 121 U/L — ABNORMAL HIGH (ref 39–117)
BUN: 11 mg/dL (ref 6–23)
CO2: 29 mEq/L (ref 19–32)
Chloride: 100 mEq/L (ref 96–112)
GFR calc Af Amer: 83 mL/min — ABNORMAL LOW (ref >90)
GFR calc non Af Amer: 71 mL/min — ABNORMAL LOW (ref >90)
Glucose, Bld: 93 mg/dL (ref 70–99)
Potassium: 3.5 mEq/L (ref 3.5–5.1)
Total Bilirubin: 0.4 mg/dL (ref 0.3–1.2)

## 2013-06-27 LAB — CBC WITH DIFFERENTIAL/PLATELET
Eosinophils Absolute: 0.2 10*3/uL (ref 0.0–0.7)
Hemoglobin: 13.9 g/dL (ref 12.0–15.0)
Lymphocytes Relative: 39 % (ref 12–46)
Lymphs Abs: 3.4 10*3/uL (ref 0.7–4.0)
MCH: 26.8 pg (ref 26.0–34.0)
Monocytes Relative: 9 % (ref 3–12)
Neutro Abs: 4.3 10*3/uL (ref 1.7–7.7)
Neutrophils Relative %: 50 % (ref 43–77)
RBC: 5.18 MIL/uL — ABNORMAL HIGH (ref 3.87–5.11)
WBC: 8.7 10*3/uL (ref 4.0–10.5)

## 2013-06-27 LAB — URINALYSIS, ROUTINE W REFLEX MICROSCOPIC
Bilirubin Urine: NEGATIVE
Ketones, ur: NEGATIVE mg/dL
Nitrite: NEGATIVE
Protein, ur: NEGATIVE mg/dL

## 2013-06-27 MED ORDER — IOHEXOL 300 MG/ML  SOLN
100.0000 mL | Freq: Once | INTRAMUSCULAR | Status: AC | PRN
  Administered 2013-06-27: 100 mL via INTRAVENOUS

## 2013-06-27 MED ORDER — SODIUM CHLORIDE 0.9 % IV SOLN
Freq: Once | INTRAVENOUS | Status: AC
  Administered 2013-06-27: 01:00:00 via INTRAVENOUS

## 2013-06-27 MED ORDER — HYDROMORPHONE HCL PF 1 MG/ML IJ SOLN
1.0000 mg | Freq: Once | INTRAMUSCULAR | Status: AC
  Administered 2013-06-27: 1 mg via INTRAVENOUS
  Filled 2013-06-27: qty 1

## 2013-06-27 MED ORDER — ONDANSETRON 4 MG PO TBDP: 4.0000 mg | ORAL_TABLET | Freq: Three times a day (TID) | ORAL | Status: DC | PRN

## 2013-06-27 MED ORDER — ONDANSETRON HCL 4 MG/2ML IJ SOLN
4.0000 mg | Freq: Once | INTRAMUSCULAR | Status: AC
Start: 2013-06-27 — End: 2013-06-27
  Administered 2013-06-27: 4 mg via INTRAVENOUS
  Filled 2013-06-27: qty 2

## 2013-06-27 MED ORDER — HYDROCODONE-ACETAMINOPHEN 5-325 MG PO TABS: 2.0000 | ORAL_TABLET | ORAL | Status: DC | PRN

## 2013-06-27 MED ORDER — IOHEXOL 300 MG/ML  SOLN
50.0000 mL | Freq: Once | INTRAMUSCULAR | Status: AC | PRN
  Administered 2013-06-27: 50 mL via ORAL

## 2013-06-27 NOTE — ED Notes (Signed)
Pt states she was watching tv about 2 hours ago and began having sharp stabbing pain,  Rates 10/10, denies nausea , vomiting or diarrhea

## 2013-06-27 NOTE — ED Provider Notes (Signed)
CSN: [id number]     Arrival date & time 06/27/13  0001 History     First MD Initiated Contact with Patient 06/27/13 0033     Chief Complaint  Patient presents with  . Abdominal Pain   (Consider location/radiation/quality/duration/timing/severity/associated sxs/prior Treatment) HPI Comments: Patient reports acute onset right lower quadrant pain.  That radiates to her back.  She had initial onset of nausea, with one episode of vomiting, none since, then.  She denies any dysuria.  She has had multiple, surgeries.  She's had a cholecystectomy and complete hysterectomy including ovaries, lap band She has not taken any medication for her discomfort  Patient is a 42 y.o. female presenting with abdominal pain. The history is provided by the patient.  Abdominal Pain Pain location:  RLQ Pain quality: aching and sharp   Pain radiates to:  Does not radiate Pain severity:  Moderate Onset quality:  Sudden Duration:  2 hours Timing:  Constant Progression:  Unchanged Chronicity:  New Context: previous surgery   Context: not alcohol use, not awakening from sleep, not diet changes, not eating, not laxative use, not medication withdrawal, not recent illness, not recent travel, not retching, not sick contacts, not suspicious food intake and not trauma   Relieved by:  None tried Worsened by:  Movement Ineffective treatments:  None tried Associated symptoms: nausea   Associated symptoms: no chest pain, no chills, no constipation, no cough, no diarrhea, no dysuria, no fever, no vaginal bleeding, no vaginal discharge and no vomiting   Risk factors: multiple surgeries and obesity   Risk factors: no alcohol abuse     Past Medical History  Diagnosis Date  . Right ureteral stone   . RLS (restless legs syndrome)   . Acid reflux   . Constipation PT STATES HAS A TEAR AT RECTAL AREA  . Frequency of urination   . Urgency of urination   . Hematuria   . Renal calculi BILATERAL  . History of kidney stones    . PONV (postoperative nausea and vomiting)   . Hypothyroidism    Past Surgical History  Procedure Laterality Date  . Laparoscopic assisted vaginal hysterectomy  MARCH 2014    W/ BILATERAL SALPINGOOPHORECTOMY  . Cholecystectomy    . Laparoscopic gastric banding  SEPT 2011  . Tonsillectomy and adenoidectomy  AGE 58'S  . Ureterolithotomy  NOV 2013  . Cystoscopy/retrograde/ureteroscopy Right 03/31/2013    Procedure: CYSTOSCOPY/RETROGRADE/URETEROSCOPY;  Surgeon: Valetta Fuller, MD;  Location: [facility];  Service: Urology;  Laterality: Right;  . Holmium laser application Right 03/31/2013    Procedure: HOLMIUM LASER APPLICATION;  Surgeon: Valetta Fuller, MD;  Location: [facility];  Service: Urology;  Laterality: Right;   No family history on file. History  Substance Use Topics  . Smoking status: Never Smoker   . Smokeless tobacco: Never Used  . Alcohol Use: No   OB History   Grav Para Term Preterm Abortions TAB SAB Ect Mult Living                 Review of Systems  Constitutional: Negative for fever and chills.  Respiratory: Negative for cough.   Cardiovascular: Negative for chest pain and leg swelling.  Gastrointestinal: Positive for nausea and abdominal pain. Negative for vomiting, diarrhea and constipation.  Genitourinary: Negative for dysuria, vaginal bleeding and vaginal discharge.  Musculoskeletal: Negative for myalgias.  Skin: Negative for rash.  Neurological: Negative for dizziness and headaches.  All other systems reviewed and are negative.  Allergies  Clindamycin/lincomycin and Percocet  Home Medications   Current Outpatient Rx  Name  Route  Sig  Dispense  Refill  . levothyroxine (SYNTHROID, LEVOTHROID) 200 MCG tablet   Oral   Take 200 mcg by mouth every evening.          . sertraline (ZOLOFT) 50 MG tablet   Oral   Take 50 mg by mouth every evening.         . traZODone (DESYREL) 50 MG tablet   Oral   Take 50 mg by  mouth at bedtime.         Marland Kitchen HYDROcodone-acetaminophen (NORCO/VICODIN) 5-325 MG per tablet   Oral   Take 2 tablets by mouth every 4 (four) hours as needed for pain.   10 tablet   0   . ondansetron (ZOFRAN ODT) 4 MG disintegrating tablet   Oral   Take 1 tablet (4 mg total) by mouth every 8 (eight) hours as needed for nausea.   20 tablet   0    BP 145/71  Pulse 77  Temp(Src) 97.8 F (36.6 C) (Oral)  Ht 5\' 4"  (1.626 m)  Wt 205 lb (92.987 kg)  BMI 35.17 kg/m2  SpO2 99% Physical Exam  Nursing note and vitals reviewed. Constitutional: She is oriented to person, place, and time. She appears well-developed and well-nourished.  HENT:  Head: Normocephalic.  Eyes: Pupils are equal, round, and reactive to light.  Neck: Normal range of motion.  Cardiovascular: Normal rate and regular rhythm.   Pulmonary/Chest: Effort normal and breath sounds normal.  Abdominal: Soft. Bowel sounds are normal. She exhibits no distension. There is no hepatosplenomegaly. There is tenderness in the right lower quadrant. There is no CVA tenderness.  Musculoskeletal: Normal range of motion.  Neurological: She is alert and oriented to person, place, and time.  Skin: Skin is warm and dry. No rash noted. No pallor.  Psychiatric: Her behavior is normal.    ED Course   Procedures (including critical care time)  Labs Reviewed  CBC WITH DIFFERENTIAL - Abnormal; Notable for the following:    RBC 5.18 (*)    All other components within normal limits  COMPREHENSIVE METABOLIC PANEL - Abnormal; Notable for the following:    Alkaline Phosphatase 121 (*)    GFR calc non Af Amer 71 (*)    GFR calc Af Amer 83 (*)    All other components within normal limits  URINALYSIS, ROUTINE W REFLEX MICROSCOPIC - Abnormal; Notable for the following:    APPearance CLOUDY (*)    Leukocytes, UA TRACE (*)    All other components within normal limits  URINE MICROSCOPIC-ADD ON   Ct Abdomen Pelvis W Contrast  06/27/2013    *RADIOLOGY REPORT*  Clinical Data: Right lower quadrant pain.  CT ABDOMEN AND PELVIS WITH CONTRAST  Technique:  Multidetector CT imaging of the abdomen and pelvis was performed following the standard protocol during bolus administration of intravenous contrast.  Contrast: 50mL OMNIPAQUE IOHEXOL 300 MG/ML  SOLN, OMNIPAQUE IOHEXOL 300 MG/ML  SOLN  Comparison: 03/25/2013.  Findings:  BODY WALL: Partly imaged bilateral breast implant.  LOWER CHEST:  Mediastinum: Unremarkable.  Lungs/pleura: No consolidation.  ABDOMEN/PELVIS:  Liver: No focal abnormality.  Biliary: Cholecystectomy.  Pancreas: Unremarkable.  Spleen: Unremarkable.  Adrenals: Unremarkable.  Kidneys and ureters: Unchanged distribution of bilateral upper pole nephrolithiasis, measuring 2-3 mm.  There is 12 mm cyst in the right anterior hilar lip.  No hydronephrosis.  Bladder: Unremarkable.  Bowel: Lap band  present.  Unchanged angle, with no evidence of slippage.  No evident perforation.  Unchanged appearance of tubing and reservoir; reservoir left of midline in the central abdomen. Normal appendix. Notably, the cecum is in the central abdomen and the appendix is directed superiorly, located in the midline.  Retroperitoneum: No mass or adenopathy.  Peritoneum: No free fluid or gas.  Reproductive: Hysterectomy.  Vascular: No acute abnormality.  OSSEOUS: No acute abnormalities.  IMPRESSION:  1.  No acute intra-abdominal findings.  Normal appendix. 2.  Unchanged positioning of lap band.  3.  Bilateral nonobstructive nephrolithiasis.   Original Report Authenticated By: Tiburcio Pea   1. Pain, abdominal, nonspecific     MDM   Results of patient's labs, urine, and CT scan, discussed with patient and her husband.  She will be given a prescription for nausea, and pain control, and will followup with her primary care physician.  If she continues to have symptoms  Arman Filter, NP 06/27/13 [phone]

## 2013-06-27 NOTE — ED Provider Notes (Signed)
Medical screening examination/treatment/procedure(s) were performed by non-physician practitioner and as supervising physician I was immediately available for consultation/collaboration.  Amarisa Wilinski, MD 06/27/13 0845 

## 2013-07-13 ENCOUNTER — Emergency Department (HOSPITAL_COMMUNITY)
Admission: EM | Admit: 2013-07-13 | Discharge: 2013-07-14 | Disposition: A | Discharge status: Home / Self Care | Payer: BC Managed Care – PPO | Attending: Emergency Medicine | Admitting: Emergency Medicine

## 2013-07-13 ENCOUNTER — Emergency Department (HOSPITAL_COMMUNITY): Payer: BC Managed Care – PPO

## 2013-07-13 ENCOUNTER — Encounter (HOSPITAL_COMMUNITY): Payer: Self-pay | Admitting: *Deleted

## 2013-07-13 DIAGNOSIS — R300 Dysuria: Secondary | ICD-10-CM | POA: Insufficient documentation

## 2013-07-13 DIAGNOSIS — Z8719 Personal history of other diseases of the digestive system: Secondary | ICD-10-CM | POA: Insufficient documentation

## 2013-07-13 DIAGNOSIS — R3915 Urgency of urination: Secondary | ICD-10-CM | POA: Insufficient documentation

## 2013-07-13 DIAGNOSIS — Z8669 Personal history of other diseases of the nervous system and sense organs: Secondary | ICD-10-CM | POA: Insufficient documentation

## 2013-07-13 DIAGNOSIS — Z9089 Acquired absence of other organs: Secondary | ICD-10-CM | POA: Insufficient documentation

## 2013-07-13 DIAGNOSIS — R110 Nausea: Secondary | ICD-10-CM | POA: Insufficient documentation

## 2013-07-13 DIAGNOSIS — Z9889 Other specified postprocedural states: Secondary | ICD-10-CM | POA: Insufficient documentation

## 2013-07-13 DIAGNOSIS — R109 Unspecified abdominal pain: Secondary | ICD-10-CM | POA: Insufficient documentation

## 2013-07-13 DIAGNOSIS — E039 Hypothyroidism, unspecified: Secondary | ICD-10-CM | POA: Insufficient documentation

## 2013-07-13 DIAGNOSIS — Z79899 Other long term (current) drug therapy: Secondary | ICD-10-CM | POA: Insufficient documentation

## 2013-07-13 DIAGNOSIS — R350 Frequency of micturition: Secondary | ICD-10-CM | POA: Insufficient documentation

## 2013-07-13 DIAGNOSIS — Z87448 Personal history of other diseases of urinary system: Secondary | ICD-10-CM | POA: Insufficient documentation

## 2013-07-13 DIAGNOSIS — N200 Calculus of kidney: Secondary | ICD-10-CM

## 2013-07-13 LAB — BASIC METABOLIC PANEL
CO2: 26 mEq/L (ref 19–32)
Calcium: 9.4 mg/dL (ref 8.4–10.5)
Creatinine, Ser: 1.09 mg/dL (ref 0.50–1.10)
GFR calc Af Amer: 71 mL/min — ABNORMAL LOW (ref >90)
GFR calc non Af Amer: 61 mL/min — ABNORMAL LOW (ref >90)

## 2013-07-13 LAB — URINALYSIS, ROUTINE W REFLEX MICROSCOPIC
Glucose, UA: NEGATIVE mg/dL
Ketones, ur: NEGATIVE mg/dL
Protein, ur: NEGATIVE mg/dL

## 2013-07-13 MED ORDER — HYDROMORPHONE HCL PF 1 MG/ML IJ SOLN
1.0000 mg | Freq: Once | INTRAMUSCULAR | Status: AC
  Administered 2013-07-13: 1 mg via INTRAVENOUS
  Filled 2013-07-13: qty 1

## 2013-07-13 MED ORDER — MORPHINE SULFATE 4 MG/ML IJ SOLN
4.0000 mg | Freq: Once | INTRAMUSCULAR | Status: AC
  Administered 2013-07-13: 4 mg via INTRAVENOUS
  Filled 2013-07-13: qty 1

## 2013-07-13 MED ORDER — SODIUM CHLORIDE 0.9 % IV BOLUS (SEPSIS)
1000.0000 mL | Freq: Once | INTRAVENOUS | Status: AC
  Administered 2013-07-13: 1000 mL via INTRAVENOUS

## 2013-07-13 MED ORDER — ONDANSETRON HCL 4 MG/2ML IJ SOLN
4.0000 mg | Freq: Once | INTRAMUSCULAR | Status: AC
  Administered 2013-07-13: 4 mg via INTRAVENOUS
  Filled 2013-07-13: qty 2

## 2013-07-13 MED ORDER — KETOROLAC TROMETHAMINE 30 MG/ML IJ SOLN
15.0000 mg | Freq: Once | INTRAMUSCULAR | Status: AC
  Administered 2013-07-14: 15 mg via INTRAVENOUS
  Filled 2013-07-13: qty 1

## 2013-07-13 NOTE — ED Notes (Signed)
Pt unable to urinate at this time.  

## 2013-07-13 NOTE — ED Notes (Signed)
Pt states today around 1 pm started having R sided lower back pain and nausea, denies vomiting or diarrhea, states when voided earlier was dark brown color then had difficulty voiding after that, pt states has a hx of kidney stones, states took 2mg  dilaudid PO at 5 pm and did not help with pain.

## 2013-07-13 NOTE — Progress Notes (Signed)
Patient confirms her pcp is Dr. Thomasena Edis of [facility].  System updated.

## 2013-07-14 MED ORDER — HYDROCODONE-ACETAMINOPHEN 7.5-325 MG PO TABS: 1.0000 | ORAL_TABLET | Freq: Four times a day (QID) | ORAL | Status: DC | PRN

## 2013-07-14 MED ORDER — TAMSULOSIN HCL 0.4 MG PO CAPS: 0.4000 mg | ORAL_CAPSULE | Freq: Every day | ORAL | Status: DC

## 2013-07-14 NOTE — ED Provider Notes (Signed)
CSN: [id number]     Arrival date & time 07/13/13  1941 History   First MD Initiated Contact with Patient 07/13/13 2009     Chief Complaint  Patient presents with  . Back Pain  . Nausea  . urinary problems    (Consider location/radiation/quality/duration/timing/severity/associated sxs/prior Treatment) HPI This 42 are old female who presents with right-sided flank pain that radiates into her groin.  She has a history of kidney stones and states that this is very similar. She has had to have lithotripsy in the past. She endorses urinary urgency but difficulty peeing. She says her symptoms started at 1 PM today. She denies any fevers. She describes the pain as dull and waxing and waning. Currently her pain is 8/10. Past Medical History  Diagnosis Date  . Right ureteral stone   . RLS (restless legs syndrome)   . Acid reflux   . Constipation PT STATES HAS A TEAR AT RECTAL AREA  . Frequency of urination   . Urgency of urination   . Hematuria   . Renal calculi BILATERAL  . History of kidney stones   . PONV (postoperative nausea and vomiting)   . Hypothyroidism    Past Surgical History  Procedure Laterality Date  . Laparoscopic assisted vaginal hysterectomy  MARCH 2014    W/ BILATERAL SALPINGOOPHORECTOMY  . Cholecystectomy    . Laparoscopic gastric banding  SEPT 2011  . Tonsillectomy and adenoidectomy  AGE 42'S  . Ureterolithotomy  NOV 2013  . Cystoscopy/retrograde/ureteroscopy Right 03/31/2013    Procedure: CYSTOSCOPY/RETROGRADE/URETEROSCOPY;  Surgeon: Valetta Fuller, MD;  Location: [facility];  Service: Urology;  Laterality: Right;  . Holmium laser application Right 03/31/2013    Procedure: HOLMIUM LASER APPLICATION;  Surgeon: Valetta Fuller, MD;  Location: [facility];  Service: Urology;  Laterality: Right;   No family history on file. History  Substance Use Topics  . Smoking status: Never Smoker   . Smokeless tobacco: Never Used  . Alcohol Use: No    OB History   Grav Para Term Preterm Abortions TAB SAB Ect Mult Living                 Review of Systems  Constitutional: Negative for fever.  Respiratory: Negative for cough, chest tightness and shortness of breath.   Cardiovascular: Negative for chest pain.  Gastrointestinal: Negative for nausea, vomiting and abdominal pain.  Genitourinary: Positive for urgency and frequency. Negative for dysuria.  Musculoskeletal: Positive for back pain.  Skin: Negative for wound.  Neurological: Negative for dizziness and syncope.  Psychiatric/Behavioral: Negative for confusion.  All other systems reviewed and are negative.    Allergies  Clindamycin/lincomycin and Percocet  Home Medications   Current Outpatient Rx  Name  Route  Sig  Dispense  Refill  . HYDROcodone-acetaminophen (NORCO/VICODIN) 5-325 MG per tablet   Oral   Take 2 tablets by mouth every 4 (four) hours as needed for pain.   10 tablet   0   . levothyroxine (SYNTHROID, LEVOTHROID) 175 MCG tablet   Oral   Take 175 mcg by mouth at bedtime.         . ondansetron (ZOFRAN ODT) 4 MG disintegrating tablet   Oral   Take 1 tablet (4 mg total) by mouth every 8 (eight) hours as needed for nausea.   20 tablet   0   . sertraline (ZOLOFT) 50 MG tablet   Oral   Take 50 mg by mouth every evening.         Marland Kitchen  traZODone (DESYREL) 50 MG tablet   Oral   Take 50 mg by mouth at bedtime.         Marland Kitchen HYDROcodone-acetaminophen (NORCO) 7.5-325 MG per tablet   Oral   Take 1 tablet by mouth every 6 (six) hours as needed for pain.   10 tablet   0   . tamsulosin (FLOMAX) 0.4 MG CAPS capsule   Oral   Take 1 capsule (0.4 mg total) by mouth daily.   5 capsule   0    BP 108/43  Pulse 88  Temp(Src) 98.4 F (36.9 C) (Oral)  Resp 20  Ht 5\' 4"  (1.626 m)  Wt 215 lb (97.523 kg)  BMI 36.89 kg/m2 Physical Exam  Nursing note and vitals reviewed. Constitutional: She is oriented to person, place, and time. She appears well-developed  and well-nourished. No distress.  HENT:  Head: Normocephalic and atraumatic.  Eyes: Pupils are equal, round, and reactive to light.  Neck: Neck supple.  Cardiovascular: Normal rate, regular rhythm and normal heart sounds.   Pulmonary/Chest: Effort normal and breath sounds normal. No respiratory distress. She has no wheezes.  Abdominal: Soft. Bowel sounds are normal. There is no tenderness. There is no rebound.  Neurological: She is alert and oriented to person, place, and time.  Skin: Skin is warm and dry.  Psychiatric: She has a normal mood and affect.    ED Course  Procedures (including critical care time) Labs Review Labs Reviewed  BASIC METABOLIC PANEL - Abnormal; Notable for the following:    Glucose, Bld 128 (*)    GFR calc non Af Amer 61 (*)    GFR calc Af Amer 71 (*)    All other components within normal limits  URINALYSIS, ROUTINE W REFLEX MICROSCOPIC - Abnormal; Notable for the following:    Leukocytes, UA SMALL (*)    All other components within normal limits  URINE MICROSCOPIC-ADD ON - Abnormal; Notable for the following:    Crystals CA OXALATE CRYSTALS (*)    All other components within normal limits   Imaging Review Ct Abdomen Pelvis Wo Contrast  07/14/2013   *RADIOLOGY REPORT*  Clinical Data: Abdominal pain  CT ABDOMEN AND PELVIS WITHOUT CONTRAST  Technique:  Multidetector CT imaging of the abdomen and pelvis was performed following the standard protocol without intravenous contrast.  Comparison: 06/27/2013  Findings: Lung bases are predominately clear.  Normal heart size. Gastric band with left paraumbilical subcutaneous reservoir.  Organ abnormality/lesion detection is limited in the absence of intravenous contrast. Within this limitation, unremarkable liver. Absent gallbladder.  No biliary ductal dilatation.  Unremarkable spleen, pancreas, adrenal glands.  Hypodensity right kidney anteriorly is most in keeping with a cyst. Nonobstructing renal stones bilaterally.  No  hydroureteronephrosis. No ureteral calculi.  No overt colitis.  Normal appendix.  Small bowel loops are normal course and caliber.  No free intraperitoneal air.  No lymphadenopathy.  Thin-walled bladder.  Absent uterus.  No adnexal mass. Trace fluid within the pelvis.  No acute osseous finding.  IMPRESSION: No acute abdominopelvic process identified by unenhanced CT.  Nonobstructing renal stones.  Status post gastric banding.  No obstruction.   Original Report Authenticated By: Jearld Lesch, M.D.    MDM   1. Kidney stone on right side     This a 42 year old female who presents with right-sided flank pain that radiates into her right groin. She states this is similar to her prior kidney stones. She is nontoxic-appearing on exam and her vital signs are  within normal limits. BMP and urinalysis were obtained.  Urinalysis shows small leukocytes with calcium oxalate crystals. BMP is within normal limits. Patient was given IV pain medication and it saline bolus. She continued to have pain. She was given Toradol and a CT scan noncontrast is was obtained to evaluate for obstructing kidney stones. This is negative. Patient does have evidence of calcium oxalate crystals in her urine which may represent a recently passed stone.  Patient had improvement of her symptoms in the ER. She will followup with her neurologist. Patient was given Flomax and pain medicine  After history, exam, and medical workup I feel the patient has been appropriately medically screened and is safe for discharge home. Pertinent diagnoses were discussed with the patient. Patient was given return precautions.  Shon Baton, MD 07/14/13 0120

## 2013-08-25 ENCOUNTER — Emergency Department (HOSPITAL_COMMUNITY)
Admission: EM | Admit: 2013-08-25 | Discharge: 2013-08-25 | Disposition: A | Discharge status: Home / Self Care | Payer: BC Managed Care – PPO | Attending: Emergency Medicine | Admitting: Emergency Medicine

## 2013-08-25 ENCOUNTER — Encounter (HOSPITAL_COMMUNITY): Payer: Self-pay | Admitting: Emergency Medicine

## 2013-08-25 DIAGNOSIS — R269 Unspecified abnormalities of gait and mobility: Secondary | ICD-10-CM | POA: Insufficient documentation

## 2013-08-25 DIAGNOSIS — Z8669 Personal history of other diseases of the nervous system and sense organs: Secondary | ICD-10-CM | POA: Insufficient documentation

## 2013-08-25 DIAGNOSIS — Z79899 Other long term (current) drug therapy: Secondary | ICD-10-CM | POA: Insufficient documentation

## 2013-08-25 DIAGNOSIS — M545 Low back pain, unspecified: Secondary | ICD-10-CM | POA: Insufficient documentation

## 2013-08-25 DIAGNOSIS — Z8719 Personal history of other diseases of the digestive system: Secondary | ICD-10-CM | POA: Insufficient documentation

## 2013-08-25 DIAGNOSIS — Z87442 Personal history of urinary calculi: Secondary | ICD-10-CM | POA: Insufficient documentation

## 2013-08-25 DIAGNOSIS — E039 Hypothyroidism, unspecified: Secondary | ICD-10-CM | POA: Insufficient documentation

## 2013-08-25 MED ORDER — HYDROCODONE-ACETAMINOPHEN 5-325 MG PO TABS: 1.0000 | ORAL_TABLET | Freq: Four times a day (QID) | ORAL | Status: DC | PRN

## 2013-08-25 MED ORDER — METHOCARBAMOL 500 MG PO TABS
750.0000 mg | ORAL_TABLET | Freq: Once | ORAL | Status: AC
  Administered 2013-08-25: 750 mg via ORAL
  Filled 2013-08-25: qty 2

## 2013-08-25 MED ORDER — IBUPROFEN 600 MG PO TABS: 600.0000 mg | ORAL_TABLET | Freq: Four times a day (QID) | ORAL | Status: DC | PRN

## 2013-08-25 MED ORDER — METHOCARBAMOL 750 MG PO TABS: 750.0000 mg | ORAL_TABLET | Freq: Three times a day (TID) | ORAL | Status: DC

## 2013-08-25 MED ORDER — KETOROLAC TROMETHAMINE 30 MG/ML IJ SOLN
30.0000 mg | Freq: Once | INTRAMUSCULAR | Status: AC
  Administered 2013-08-25: 30 mg via INTRAMUSCULAR
  Filled 2013-08-25: qty 1

## 2013-08-25 NOTE — ED Notes (Signed)
Discharge delayed due to medication administration.  

## 2013-08-25 NOTE — ED Notes (Signed)
Pt c/o mid back x1 day. Reports pain 10/10. Denies injury.

## 2013-08-25 NOTE — ED Provider Notes (Signed)
CSN: [id number]     Arrival date & time 08/25/13  2011 History  This chart was scribed for Arman Filter, NP,  working with Junius Argyle, MD, by Allene Dillon, ED Scribe. This patient was seen in room WTR9/WTR9 and the patient's care was started at 9:23 PM.    Chief Complaint  Patient presents with  . Back Pain    The history is provided by the patient.   HPI Comments: Morgan [name] is a 42 y.o. female who presents to the Emergency Department complaining of "10/10" sudden onset, constant lower middle back pain which began 12 hours ago. Pt reports pain is radiating to bilateral side of back. Pain worsens with ambulation. Pt took tylenol with no relief. Pt has history of kidney stones but states back pain is not similar to the pain she experienced from her kidney stones. Pt denies weakness or any other symptoms. Pt denies any physical activity, injury, trauma, MVC to cause pain.   Allergies  Allergen Reactions  . Clindamycin/Lincomycin Hives  . Percocet [Oxycodone-Acetaminophen] Nausea And Vomiting and Rash    PCP- Dr. Thomasena Edis  Past Medical History  Diagnosis Date  . Right ureteral stone   . RLS (restless legs syndrome)   . Acid reflux   . Constipation PT STATES HAS A TEAR AT RECTAL AREA  . Frequency of urination   . Urgency of urination   . Hematuria   . Renal calculi BILATERAL  . History of kidney stones   . PONV (postoperative nausea and vomiting)   . Hypothyroidism    Past Surgical History  Procedure Laterality Date  . Laparoscopic assisted vaginal hysterectomy  MARCH 2014    W/ BILATERAL SALPINGOOPHORECTOMY  . Cholecystectomy    . Laparoscopic gastric banding  SEPT 2011  . Tonsillectomy and adenoidectomy  AGE 67'S  . Ureterolithotomy  NOV 2013  . Cystoscopy/retrograde/ureteroscopy Right 03/31/2013    Procedure: CYSTOSCOPY/RETROGRADE/URETEROSCOPY;  Surgeon: Valetta Fuller, MD;  Location: [facility];  Service: Urology;  Laterality: Right;  .  Holmium laser application Right 03/31/2013    Procedure: HOLMIUM LASER APPLICATION;  Surgeon: Valetta Fuller, MD;  Location: [facility];  Service: Urology;  Laterality: Right;   History reviewed. No pertinent family history. History  Substance Use Topics  . Smoking status: Never Smoker   . Smokeless tobacco: Never Used  . Alcohol Use: No   OB History   Grav Para Term Preterm Abortions TAB SAB Ect Mult Living                 Review of Systems  Constitutional: Negative for fever.  Gastrointestinal: Negative for vomiting, abdominal pain and diarrhea.  Genitourinary: Negative for dysuria and flank pain.  Musculoskeletal: Positive for back pain and gait problem. Negative for myalgias.  Skin: Negative for rash and wound.  All other systems reviewed and are negative.    Allergies  Clindamycin/lincomycin and Percocet  Home Medications   Current Outpatient Rx  Name  Route  Sig  Dispense  Refill  . levothyroxine (SYNTHROID, LEVOTHROID) 175 MCG tablet   Oral   Take 175 mcg by mouth at bedtime.         . sertraline (ZOLOFT) 50 MG tablet   Oral   Take 50 mg by mouth every evening.         . traZODone (DESYREL) 50 MG tablet   Oral   Take 50 mg by mouth at bedtime.         Marland Kitchen  tamsulosin (FLOMAX) 0.4 MG CAPS capsule   Oral   Take 1 capsule (0.4 mg total) by mouth daily.   5 capsule   0    Triage Vitals: BP 133/80  Pulse 95  Temp(Src) 98 F (36.7 C) (Oral)  Resp 14  SpO2 98% Physical Exam  Nursing note and vitals reviewed. Constitutional: She appears well-developed and well-nourished.  HENT:  Head: Normocephalic.  Eyes: Pupils are equal, round, and reactive to light.  Neck: Normal range of motion.  Cardiovascular: Normal rate and regular rhythm.   Pulmonary/Chest: Effort normal and breath sounds normal.  Abdominal: Soft.  Musculoskeletal: Normal range of motion. She exhibits tenderness. She exhibits no edema.       Back:  Neurological: She is  alert.    ED Course  Procedures (including critical care time)  DIAGNOSTIC STUDIES: Oxygen Saturation is 98% on RA, normal by my interpretation.    COORDINATION OF CARE: 9:26 PM- Pt advised of plan for treatment which includes pain medication, muscle relaxer and pt agrees.  Medications  ketorolac (TORADOL) 30 MG/ML injection 30 mg (30 mg Intramuscular Given 08/25/13 2146)  methocarbamol (ROBAXIN) tablet 750 mg (750 mg Oral Given 08/25/13 2146)     Labs Review Labs Reviewed - No data to display Imaging Review No results found.  MDM  No diagnosis found.  Low back pain   I personally performed the services described in this documentation, which was scribed in my presence. The recorded information has been reviewed and is accurate.   Arman Filter, NP 08/25/13 2150

## 2013-08-26 NOTE — ED Provider Notes (Signed)
Medical screening examination/treatment/procedure(s) were performed by non-physician practitioner and as supervising physician I was immediately available for consultation/collaboration.   Junius Argyle, MD 08/26/13 1258

## 2013-10-09 ENCOUNTER — Encounter (HOSPITAL_COMMUNITY): Payer: Self-pay | Admitting: Emergency Medicine

## 2013-10-09 ENCOUNTER — Emergency Department (HOSPITAL_COMMUNITY)
Admission: EM | Admit: 2013-10-09 | Discharge: 2013-10-09 | Disposition: A | Discharge status: Home / Self Care | Payer: BC Managed Care – PPO | Attending: Emergency Medicine | Admitting: Emergency Medicine

## 2013-10-09 ENCOUNTER — Emergency Department (HOSPITAL_COMMUNITY): Payer: BC Managed Care – PPO

## 2013-10-09 DIAGNOSIS — R1013 Epigastric pain: Secondary | ICD-10-CM | POA: Insufficient documentation

## 2013-10-09 DIAGNOSIS — E039 Hypothyroidism, unspecified: Secondary | ICD-10-CM | POA: Insufficient documentation

## 2013-10-09 DIAGNOSIS — K219 Gastro-esophageal reflux disease without esophagitis: Secondary | ICD-10-CM | POA: Insufficient documentation

## 2013-10-09 DIAGNOSIS — Z87442 Personal history of urinary calculi: Secondary | ICD-10-CM | POA: Insufficient documentation

## 2013-10-09 DIAGNOSIS — Z8669 Personal history of other diseases of the nervous system and sense organs: Secondary | ICD-10-CM | POA: Insufficient documentation

## 2013-10-09 DIAGNOSIS — Z792 Long term (current) use of antibiotics: Secondary | ICD-10-CM | POA: Insufficient documentation

## 2013-10-09 DIAGNOSIS — Z87448 Personal history of other diseases of urinary system: Secondary | ICD-10-CM | POA: Insufficient documentation

## 2013-10-09 DIAGNOSIS — Z79899 Other long term (current) drug therapy: Secondary | ICD-10-CM | POA: Insufficient documentation

## 2013-10-09 DIAGNOSIS — Z8619 Personal history of other infectious and parasitic diseases: Secondary | ICD-10-CM | POA: Insufficient documentation

## 2013-10-09 HISTORY — DX: Other specified bacterial intestinal infections: A04.8

## 2013-10-09 LAB — POCT I-STAT TROPONIN I: Troponin i, poc: 0 ng/mL (ref 0.00–0.08)

## 2013-10-09 LAB — COMPREHENSIVE METABOLIC PANEL
BUN: 13 mg/dL (ref 6–23)
CO2: 27 mEq/L (ref 19–32)
Calcium: 10.5 mg/dL (ref 8.4–10.5)
Creatinine, Ser: 1.07 mg/dL (ref 0.50–1.10)
GFR calc Af Amer: 72 mL/min — ABNORMAL LOW (ref >90)
GFR calc non Af Amer: 62 mL/min — ABNORMAL LOW (ref >90)
Glucose, Bld: 86 mg/dL (ref 70–99)

## 2013-10-09 LAB — CBC WITH DIFFERENTIAL/PLATELET
Basophils Absolute: 0 10*3/uL (ref 0.0–0.1)
Eosinophils Relative: 3 % (ref 0–5)
HCT: 43.4 % (ref 36.0–46.0)
Lymphocytes Relative: 31 % (ref 12–46)
Lymphs Abs: 2.8 10*3/uL (ref 0.7–4.0)
MCV: 78.8 fL (ref 78.0–100.0)
Monocytes Absolute: 0.6 10*3/uL (ref 0.1–1.0)
RDW: 13.9 % (ref 11.5–15.5)
WBC: 9.3 10*3/uL (ref 4.0–10.5)

## 2013-10-09 MED ORDER — TRAMADOL HCL 50 MG PO TABS: 50.0000 mg | ORAL_TABLET | Freq: Four times a day (QID) | ORAL | Status: DC | PRN

## 2013-10-09 MED ORDER — MORPHINE SULFATE 4 MG/ML IJ SOLN
4.0000 mg | Freq: Once | INTRAMUSCULAR | Status: AC
  Administered 2013-10-09: 4 mg via INTRAVENOUS
  Filled 2013-10-09: qty 1

## 2013-10-09 NOTE — ED Notes (Signed)
Patient is alert and oriented x3.  She is complaining of chest tightness and pressure that radiates to her back. She states that this started earlier this evening and has stayed the same.  Currently she rates her chest pain 9 of 10.

## 2013-10-09 NOTE — ED Provider Notes (Signed)
CSN: [id number]     Arrival date & time 10/09/13  2024 History   First MD Initiated Contact with Patient 10/09/13 2101     Chief Complaint  Patient presents with  . Chest Pain   (Consider location/radiation/quality/duration/timing/severity/associated sxs/prior Treatment) HPI Comments: Patient presents to the emergency department with chief complaint of chest tightness and pressure that radiates to her back. Patient states the pain started this evening. She states that it is a constant pain, and she currently rates the pain 9/10. She states that she thought the pain was related to her H. pylori infection, but her significant other questions or come to the emergency department. She endorses some associated nausea, but denies any shortness of breath or diaphoresis. Her only cardiac risk factors are family history. Nothing makes the pain better or worse. She has not tried anything to alleviate her symptoms.  The history is provided by the patient. No language interpreter was used.    Past Medical History  Diagnosis Date  . Right ureteral stone   . RLS (restless legs syndrome)   . Acid reflux   . Constipation PT STATES HAS A TEAR AT RECTAL AREA  . Frequency of urination   . Urgency of urination   . Hematuria   . Renal calculi BILATERAL  . History of kidney stones   . PONV (postoperative nausea and vomiting)   . Hypothyroidism   . H. pylori infection    Past Surgical History  Procedure Laterality Date  . Laparoscopic assisted vaginal hysterectomy  MARCH 2014    W/ BILATERAL SALPINGOOPHORECTOMY  . Cholecystectomy    . Laparoscopic gastric banding  SEPT 2011  . Tonsillectomy and adenoidectomy  AGE 29'S  . Ureterolithotomy  NOV 2013  . Cystoscopy/retrograde/ureteroscopy Right 03/31/2013    Procedure: CYSTOSCOPY/RETROGRADE/URETEROSCOPY;  Surgeon: Valetta Fuller, MD;  Location: [facility];  Service: Urology;  Laterality: Right;  . Holmium laser application Right 03/31/2013     Procedure: HOLMIUM LASER APPLICATION;  Surgeon: Valetta Fuller, MD;  Location: [facility];  Service: Urology;  Laterality: Right;   History reviewed. No pertinent family history. History  Substance Use Topics  . Smoking status: Never Smoker   . Smokeless tobacco: Never Used  . Alcohol Use: No   OB History   Grav Para Term Preterm Abortions TAB SAB Ect Mult Living                 Review of Systems  All other systems reviewed and are negative.    Allergies  Clindamycin/lincomycin and Percocet  Home Medications   Current Outpatient Rx  Name  Route  Sig  Dispense  Refill  . amoxicillin (AMOXIL) 500 MG capsule   Oral   Take 1,000 mg by mouth 2 (two) times daily.         . clarithromycin (BIAXIN) 500 MG tablet   Oral   Take 500 mg by mouth 2 (two) times daily.         Marland Kitchen ibuprofen (ADVIL,MOTRIN) 600 MG tablet   Oral   Take 1 tablet (600 mg total) by mouth every 6 (six) hours as needed for pain.   30 tablet   0   . lansoprazole (PREVACID) 30 MG capsule   Oral   Take 30 mg by mouth 2 (two) times daily before a meal.         . levothyroxine (SYNTHROID, LEVOTHROID) 150 MCG tablet   Oral   Take 150 mcg by mouth at  bedtime.         . sertraline (ZOLOFT) 100 MG tablet   Oral   Take 100 mg by mouth at bedtime.         . traZODone (DESYREL) 50 MG tablet   Oral   Take 50 mg by mouth at bedtime.          BP 122/70  Pulse 80  Temp(Src) 98.1 F (36.7 C) (Oral)  Resp 13  SpO2 97% Physical Exam  Nursing note and vitals reviewed. Constitutional: She is oriented to person, place, and time. She appears well-developed and well-nourished.  HENT:  Head: Normocephalic and atraumatic.  Eyes: Conjunctivae and EOM are normal. Pupils are equal, round, and reactive to light.  Neck: Normal range of motion. Neck supple.  Cardiovascular: Normal rate and regular rhythm.  Exam reveals no gallop and no friction rub.   No murmur heard. Pulmonary/Chest:  Effort normal and breath sounds normal. No respiratory distress. She has no wheezes. She has no rales. She exhibits no tenderness.  Abdominal: Soft. She exhibits no distension and no mass. There is tenderness. There is no rebound and no guarding.  Epigastric region tender to palpation, no other focal abdominal tenderness, no Murphy's sign, no pain at McBurney's point, no fluid wave, or signs of peritonitis or acute abdomen  Musculoskeletal: Normal range of motion. She exhibits no edema and no tenderness.  Neurological: She is alert and oriented to person, place, and time.  Skin: Skin is warm and dry.  Psychiatric: She has a normal mood and affect. Her behavior is normal. Judgment and thought content normal.    ED Course  Procedures (including critical care time) Results for orders placed during the hospital encounter of 10/09/13  CBC WITH DIFFERENTIAL      Result Value Range   WBC 9.3  4.0 - 10.5 K/uL   RBC 5.51 (*) 3.87 - 5.11 MIL/uL   Hemoglobin 15.0  12.0 - 15.0 g/dL   HCT 16.1  09.6 - 04.5 %   MCV 78.8  78.0 - 100.0 fL   MCH 27.2  26.0 - 34.0 pg   MCHC 34.6  30.0 - 36.0 g/dL   RDW 40.9  81.1 - 91.4 %   Platelets 231  150 - 400 K/uL   Neutrophils Relative % 60  43 - 77 %   Neutro Abs 5.6  1.7 - 7.7 K/uL   Lymphocytes Relative 31  12 - 46 %   Lymphs Abs 2.8  0.7 - 4.0 K/uL   Monocytes Relative 6  3 - 12 %   Monocytes Absolute 0.6  0.1 - 1.0 K/uL   Eosinophils Relative 3  0 - 5 %   Eosinophils Absolute 0.3  0.0 - 0.7 K/uL   Basophils Relative 0  0 - 1 %   Basophils Absolute 0.0  0.0 - 0.1 K/uL  COMPREHENSIVE METABOLIC PANEL      Result Value Range   Sodium 138  135 - 145 mEq/L   Potassium 3.7  3.5 - 5.1 mEq/L   Chloride 99  96 - 112 mEq/L   CO2 27  19 - 32 mEq/L   Glucose, Bld 86  70 - 99 mg/dL   BUN 13  6 - 23 mg/dL   Creatinine, Ser 7.82  0.50 - 1.10 mg/dL   Calcium 95.6  8.4 - 21.3 mg/dL   Total Protein 7.9  6.0 - 8.3 g/dL   Albumin 4.5  3.5 - 5.2 g/dL   AST 18  0 -  37 U/L   ALT 19  0 - 35 U/L   Alkaline Phosphatase 108  39 - 117 U/L   Total Bilirubin 0.6  0.3 - 1.2 mg/dL   GFR calc non Af Amer 62 (*) >90 mL/min   GFR calc Af Amer 72 (*) >90 mL/min  LIPASE, BLOOD      Result Value Range   Lipase 38  11 - 59 U/L  POCT I-STAT TROPONIN I      Result Value Range   Troponin i, poc 0.00  0.00 - 0.08 ng/mL   Comment 3            Dg Chest 2 View  10/09/2013   CLINICAL DATA:  Shortness of breath, chest pain, and cough  EXAM: CHEST  2 VIEW  COMPARISON:  None.  FINDINGS: The heart size and mediastinal contours are within normal limits. Both lungs are clear. The visualized skeletal structures are unremarkable.  IMPRESSION: No active cardiopulmonary disease.   Electronically Signed   By: Esperanza Heir M.D.   On: 10/09/2013 22:05     EKG Interpretation    Date/Time:  Saturday October 09 2013 20:34:50 EST Ventricular Rate:  84 PR Interval:  158 QRS Duration: 80 QT Interval:  354 QTC Calculation: 418 R Axis:   81 Text Interpretation:  Sinus rhythm Normal ECG No previous tracing Confirmed by Denton Lank  MD, KEVIN ([id number]) on 10/09/2013 9:06:17 PM            MDM   1. Epigastric pain     Patient with epigastric abdominal pain. Concern for pancreatitis versus gallbladder disease. Could also be H. pylori related for GERD. Doubt ACS, but the patient's quite concerned about this, so we will check basic labs, and EKG.  EKG is normal.  10:51 PM Labs are reassuring.  Patient has had this pain for some time now.  She states that her doctors have been unable to determine the source.  At this time, I do not believe that there is an acute or emergent condition, and am satisfied with the workup tonight.  I will discharge the patient to home with PCP follow-up. Patient seen by and discussed with Dr. Denton Lank, who agrees with the plan.  Roxy Horseman, PA-C 10/09/13 2252

## 2013-10-09 NOTE — ED Notes (Signed)
Pt c/o left sided chest pain radiating to her back that started a couple of days ago however was intermittent until tonight.  Presented to ED after having BP taken at pharmacy and pharmacist advising her to be seen. Pt was recently dx w/ h.pylori and is on amoxicillin, clarithromycin and lansoprazole.

## 2013-10-11 NOTE — ED Provider Notes (Signed)
Medical screening examination/treatment/procedure(s) were conducted as a shared visit with non-physician practitioner(s) and myself.  I personally evaluated the patient during the encounter.  EKG Interpretation    Date/Time:  Saturday October 09 2013 20:34:50 EST Ventricular Rate:  84 PR Interval:  158 QRS Duration: 80 QT Interval:  354 QTC Calculation: 418 R Axis:   81 Text Interpretation:  Sinus rhythm Normal ECG No previous tracing Confirmed by Shaye Elling  MD, Macel Yearsley ([id number]) on 10/09/2013 9:06:17 PM            Pt c/o epigastric area pain that occasionally feels in back, notes similar pain for past 1+ year for which she has been seen by both pcp and gi. States remote hx lap banding, states all fluid has been removed for past 1+ yrs, and has plans for future surgery to remove altogether. Hx cholecystectomy. Denies fever or chills. abd soft nt. No cp or sob.   Suzi Roots, MD 10/11/13 1051

## 2013-11-04 ENCOUNTER — Ambulatory Visit (INDEPENDENT_AMBULATORY_CARE_PROVIDER_SITE_OTHER): Payer: Self-pay | Admitting: Surgery

## 2013-11-09 ENCOUNTER — Encounter (INDEPENDENT_AMBULATORY_CARE_PROVIDER_SITE_OTHER): Payer: Self-pay | Admitting: Surgery

## 2014-03-15 HISTORY — PX: GASTRIC BYPASS: SHX52

## 2014-06-08 ENCOUNTER — Encounter (HOSPITAL_COMMUNITY): Payer: Self-pay | Admitting: Emergency Medicine

## 2014-06-08 ENCOUNTER — Emergency Department (HOSPITAL_COMMUNITY)
Admission: EM | Admit: 2014-06-08 | Discharge: 2014-06-09 | Disposition: A | Discharge status: Home / Self Care | Payer: BC Managed Care – PPO | Attending: Emergency Medicine | Admitting: Emergency Medicine

## 2014-06-08 DIAGNOSIS — Z79899 Other long term (current) drug therapy: Secondary | ICD-10-CM | POA: Insufficient documentation

## 2014-06-08 DIAGNOSIS — Z87442 Personal history of urinary calculi: Secondary | ICD-10-CM | POA: Insufficient documentation

## 2014-06-08 DIAGNOSIS — E039 Hypothyroidism, unspecified: Secondary | ICD-10-CM | POA: Insufficient documentation

## 2014-06-08 DIAGNOSIS — R110 Nausea: Secondary | ICD-10-CM | POA: Insufficient documentation

## 2014-06-08 DIAGNOSIS — Z8669 Personal history of other diseases of the nervous system and sense organs: Secondary | ICD-10-CM | POA: Insufficient documentation

## 2014-06-08 DIAGNOSIS — N390 Urinary tract infection, site not specified: Secondary | ICD-10-CM | POA: Insufficient documentation

## 2014-06-08 DIAGNOSIS — Z8619 Personal history of other infectious and parasitic diseases: Secondary | ICD-10-CM | POA: Insufficient documentation

## 2014-06-08 DIAGNOSIS — K219 Gastro-esophageal reflux disease without esophagitis: Secondary | ICD-10-CM | POA: Insufficient documentation

## 2014-06-08 DIAGNOSIS — Z90710 Acquired absence of both cervix and uterus: Secondary | ICD-10-CM | POA: Insufficient documentation

## 2014-06-08 DIAGNOSIS — Z9884 Bariatric surgery status: Secondary | ICD-10-CM | POA: Insufficient documentation

## 2014-06-08 DIAGNOSIS — Z9089 Acquired absence of other organs: Secondary | ICD-10-CM | POA: Insufficient documentation

## 2014-06-08 LAB — CBC WITH DIFFERENTIAL/PLATELET
BASOS ABS: 0 10*3/uL (ref 0.0–0.1)
BASOS PCT: 1 % (ref 0–1)
EOS PCT: 5 % (ref 0–5)
Eosinophils Absolute: 0.3 10*3/uL (ref 0.0–0.7)
HCT: 39.2 % (ref 36.0–46.0)
HEMOGLOBIN: 13 g/dL (ref 12.0–15.0)
Lymphocytes Relative: 44 % (ref 12–46)
Lymphs Abs: 3.1 10*3/uL (ref 0.7–4.0)
MCH: 26.9 pg (ref 26.0–34.0)
MCHC: 33.2 g/dL (ref 30.0–36.0)
MCV: 81.2 fL (ref 78.0–100.0)
Monocytes Absolute: 0.6 10*3/uL (ref 0.1–1.0)
Monocytes Relative: 8 % (ref 3–12)
Neutro Abs: 2.9 10*3/uL (ref 1.7–7.7)
Neutrophils Relative %: 42 % — ABNORMAL LOW (ref 43–77)
Platelets: 185 10*3/uL (ref 150–400)
RBC: 4.83 MIL/uL (ref 3.87–5.11)
RDW: 14.3 % (ref 11.5–15.5)
WBC: 6.9 10*3/uL (ref 4.0–10.5)

## 2014-06-08 NOTE — ED Notes (Signed)
Patient drinking Gatorade upon entering room Patient in NAD

## 2014-06-08 NOTE — ED Notes (Signed)
Patient able to ambulate to bathroom without difficulty or assistance from nursing staff--gait steady Patient in NAD

## 2014-06-08 NOTE — ED Notes (Signed)
Pt has hx of kidney stones. States she is having rt sided flank pain and decreased urine output.

## 2014-06-09 ENCOUNTER — Emergency Department (HOSPITAL_COMMUNITY): Payer: BC Managed Care – PPO

## 2014-06-09 LAB — URINALYSIS, ROUTINE W REFLEX MICROSCOPIC
BILIRUBIN URINE: NEGATIVE
Glucose, UA: NEGATIVE mg/dL
Hgb urine dipstick: NEGATIVE
KETONES UR: NEGATIVE mg/dL
NITRITE: NEGATIVE
Protein, ur: NEGATIVE mg/dL
SPECIFIC GRAVITY, URINE: 1.024 (ref 1.005–1.030)
Urobilinogen, UA: 0.2 mg/dL (ref 0.0–1.0)
pH: 6 (ref 5.0–8.0)

## 2014-06-09 LAB — COMPREHENSIVE METABOLIC PANEL
ALBUMIN: 4.4 g/dL (ref 3.5–5.2)
ALT: 21 U/L (ref 0–35)
ANION GAP: 15 (ref 5–15)
AST: 23 U/L (ref 0–37)
Alkaline Phosphatase: 101 U/L (ref 39–117)
BUN: 18 mg/dL (ref 6–23)
CALCIUM: 9.7 mg/dL (ref 8.4–10.5)
CO2: 25 mEq/L (ref 19–32)
CREATININE: 1 mg/dL (ref 0.50–1.10)
Chloride: 102 mEq/L (ref 96–112)
GFR calc Af Amer: 78 mL/min — ABNORMAL LOW (ref >90)
GFR, EST NON AFRICAN AMERICAN: 67 mL/min — AB (ref >90)
Glucose, Bld: 107 mg/dL — ABNORMAL HIGH (ref 70–99)
Potassium: 3.8 mEq/L (ref 3.7–5.3)
Sodium: 142 mEq/L (ref 137–147)
Total Bilirubin: 0.6 mg/dL (ref 0.3–1.2)
Total Protein: 7.5 g/dL (ref 6.0–8.3)

## 2014-06-09 LAB — URINE MICROSCOPIC-ADD ON

## 2014-06-09 LAB — LIPASE, BLOOD: LIPASE: 45 U/L (ref 11–59)

## 2014-06-09 MED ORDER — MORPHINE SULFATE 4 MG/ML IJ SOLN
4.0000 mg | Freq: Once | INTRAMUSCULAR | Status: AC
  Administered 2014-06-09: 4 mg via INTRAMUSCULAR
  Filled 2014-06-09: qty 1

## 2014-06-09 MED ORDER — HYDROCODONE-ACETAMINOPHEN 5-325 MG PO TABS: 1.0000 | ORAL_TABLET | Freq: Four times a day (QID) | ORAL | Status: DC | PRN

## 2014-06-09 MED ORDER — ONDANSETRON HCL 4 MG PO TABS: 4.0000 mg | ORAL_TABLET | Freq: Four times a day (QID) | ORAL | Status: DC

## 2014-06-09 MED ORDER — ONDANSETRON 8 MG PO TBDP
8.0000 mg | ORAL_TABLET | Freq: Once | ORAL | Status: AC
  Administered 2014-06-09: 8 mg via ORAL
  Filled 2014-06-09: qty 1

## 2014-06-09 MED ORDER — CEPHALEXIN 500 MG PO CAPS: 500.0000 mg | ORAL_CAPSULE | Freq: Four times a day (QID) | ORAL | Status: DC

## 2014-06-09 MED ORDER — CEFTRIAXONE SODIUM 1 G IJ SOLR
1.0000 g | Freq: Once | INTRAMUSCULAR | Status: AC
  Administered 2014-06-09: 1 g via INTRAMUSCULAR
  Filled 2014-06-09: qty 10

## 2014-06-09 NOTE — ED Notes (Signed)
Patient asking for pain medication Will make ED PA aware

## 2014-06-09 NOTE — ED Notes (Signed)
ED PA at bedside

## 2014-06-09 NOTE — ED Provider Notes (Signed)
Medical screening examination/treatment/procedure(s) were performed by non-physician practitioner and as supervising physician I was immediately available for consultation/collaboration.   EKG Interpretation None       Kalman Drape, MD 06/09/14 [id number]

## 2014-06-09 NOTE — ED Provider Notes (Signed)
CSN: [id number]     Arrival date & time 06/08/14  2301 History   First MD Initiated Contact with Patient 06/08/14 2326     Chief Complaint  Patient presents with  . Flank Pain     (Consider location/radiation/quality/duration/timing/severity/associated sxs/prior Treatment) HPI Comments: Patient is a 43 year old female with history of kidney stones, restless leg syndrome, hypothyroidism who presents today with right-sided flank pain. It began earlier tonight. It is a sharp, stabbing pain. It feels like her prior kidney stones. She has some associated nausea without vomiting. She has associated difficulty with urination. She reports that when she urinates only a small amount comes out each time. She denies any hematuria, fevers, chills. She reports similar symptoms approximately 50 times prior. She has seen Dr. Risa Grill in the past.   Patient is a 43 y.o. female presenting with flank pain. The history is provided by the patient. No language interpreter was used.  Flank Pain Associated symptoms include nausea. Pertinent negatives include no abdominal pain, chest pain, chills, fever or vomiting.    Past Medical History  Diagnosis Date  . Right ureteral stone   . RLS (restless legs syndrome)   . Acid reflux   . Constipation PT STATES HAS A TEAR AT RECTAL AREA  . Frequency of urination   . Urgency of urination   . Hematuria   . Renal calculi BILATERAL  . History of kidney stones   . PONV (postoperative nausea and vomiting)   . Hypothyroidism   . H. pylori infection    Past Surgical History  Procedure Laterality Date  . Laparoscopic assisted vaginal hysterectomy  MARCH 2014    W/ BILATERAL SALPINGOOPHORECTOMY  . Cholecystectomy    . Laparoscopic gastric banding  SEPT 2011  . Tonsillectomy and adenoidectomy  AGE 36'S  . Ureterolithotomy  NOV 2013  . Cystoscopy/retrograde/ureteroscopy Right 03/31/2013    Procedure: CYSTOSCOPY/RETROGRADE/URETEROSCOPY;  Surgeon: Bernestine Amass, MD;   Location: [facility];  Service: Urology;  Laterality: Right;  . Holmium laser application Right 3/78/5885    Procedure: HOLMIUM LASER APPLICATION;  Surgeon: Bernestine Amass, MD;  Location: [facility];  Service: Urology;  Laterality: Right;  . Gastric bypass  03/15/14   No family history on file. History  Substance Use Topics  . Smoking status: Never Smoker   . Smokeless tobacco: Never Used  . Alcohol Use: No   OB History   Grav Para Term Preterm Abortions TAB SAB Ect Mult Living                 Review of Systems  Constitutional: Negative for fever and chills.  Respiratory: Negative for shortness of breath.   Cardiovascular: Negative for chest pain.  Gastrointestinal: Positive for nausea. Negative for vomiting and abdominal pain.  Genitourinary: Positive for flank pain and difficulty urinating. Negative for hematuria.  All other systems reviewed and are negative.     Allergies  Clindamycin/lincomycin; Nsaids; and Percocet  Home Medications   Prior to Admission medications   Medication Sig Start Date End Date Taking? Authorizing Provider  lansoprazole (PREVACID) 30 MG capsule Take 30 mg by mouth 2 (two) times daily before a meal.   Yes Historical Provider, MD  levothyroxine (LEVOTHROID) 88 MCG tablet Take 88 mcg by mouth daily before breakfast.   Yes Historical Provider, MD  sertraline (ZOLOFT) 100 MG tablet Take 100 mg by mouth at bedtime.   Yes Historical Provider, MD  traZODone (DESYREL) 50 MG tablet Take 50 mg by  mouth at bedtime.   Yes Historical Provider, MD   BP 117/72  Pulse 90  Temp(Src) 98.3 F (36.8 C) (Oral)  Resp 18  Ht 5\' 3"  (1.6 m)  Wt 196 lb (88.905 kg)  BMI 34.73 kg/m2  SpO2 96% Physical Exam  Nursing note and vitals reviewed. Constitutional: She is oriented to person, place, and time. She appears well-developed and well-nourished. No distress.  No acute distress. Patient laying in bed with daughter.   HENT:  Head:  Normocephalic and atraumatic.  Right Ear: External ear normal.  Left Ear: External ear normal.  Nose: Nose normal.  Mouth/Throat: Oropharynx is clear and moist.  Eyes: Conjunctivae are normal.  Neck: Normal range of motion.  Cardiovascular: Normal rate, regular rhythm and normal heart sounds.   Pulmonary/Chest: Effort normal and breath sounds normal. No stridor. No respiratory distress. She has no wheezes. She has no rales.  Abdominal: Soft. She exhibits no distension. There is tenderness.    Musculoskeletal: Normal range of motion.       Back:  Neurological: She is alert and oriented to person, place, and time. She has normal strength.  Skin: Skin is warm and dry. She is not diaphoretic. No erythema.  Psychiatric: She has a normal mood and affect. Her behavior is normal.    ED Course  Procedures (including critical care time) Labs Review Labs Reviewed  CBC WITH DIFFERENTIAL - Abnormal; Notable for the following:    Neutrophils Relative % 42 (*)    All other components within normal limits  COMPREHENSIVE METABOLIC PANEL - Abnormal; Notable for the following:    Glucose, Bld 107 (*)    GFR calc non Af Amer 67 (*)    GFR calc Af Amer 78 (*)    All other components within normal limits  URINALYSIS, ROUTINE W REFLEX MICROSCOPIC - Abnormal; Notable for the following:    APPearance CLOUDY (*)    Leukocytes, UA MODERATE (*)    All other components within normal limits  URINE MICROSCOPIC-ADD ON - Abnormal; Notable for the following:    Squamous Epithelial / LPF FEW (*)    Crystals CA OXALATE CRYSTALS (*)    All other components within normal limits  LIPASE, BLOOD    Imaging Review Ct Abdomen Pelvis Wo Contrast  06/09/2014   CLINICAL DATA:  Right flank pain.  EXAM: CT ABDOMEN AND PELVIS WITHOUT CONTRAST  TECHNIQUE: Multidetector CT imaging of the abdomen and pelvis was performed following the standard protocol without IV contrast.  COMPARISON:  CT of the abdomen and pelvis July 13, 2013  FINDINGS: LUNG BASES: Included view of the lung bases are clear. The visualized heart and pericardium are unremarkable.  KIDNEYS/BLADDER: Kidneys are orthotopic, demonstrating normal size and morphology. 4 mm left upper pole, punctate left lower pole and right interpolar nephrolithiasis No hydronephrosis; limited assessment for renal masses on this nonenhanced examination. The unopacified ureters are normal in course and caliber. Urinary bladder is partially distended and unremarkable.  SOLID ORGANS: The imaged liver, pancreas and adrenal glands are unremarkable for this non-contrast examination. Status post cholecystectomy. The spleen may be mildly enlarged though, incompletely imaged and stable from prior examination.  GASTROINTESTINAL TRACT: Cecum is somewhat distended with stool, 7.4 cm in transaxial dimension. Mild amount of retained large bowel stool in the ascending and transverse colon. No pericolonic inflammation. Status post gastric bypass (Roux-en-Y) surgery, interval removal of lap band. No dilated small bowel. Normal air-filled appendix.  PERITONEUM/RETROPERITONEUM: No intraperitoneal free fluid nor free air.  Aortoiliac vessels are normal in course and caliber. No lymphadenopathy by CT size criteria. Status post hysterectomy. Phlebolith in right pelvis.  SOFT TISSUES/ OSSEOUS STRUCTURES:  Nonsuspicious.  IMPRESSION: Stool distended cecum, in a background of mild to moderate amount of retained large bowel stool, no bowel obstruction. Normal appendix.  Bilateral nonobstructing nephrolithiasis measuring up to 4 mm in left upper pole.  Status post cholecystectomy, gastric bypass surgery and hysterectomy.   Electronically Signed   By: Elon Alas   On: 06/09/2014 01:48     EKG Interpretation None      MDM   Final diagnoses:  UTI (lower urinary tract infection)   Patient presents to the emergency department with a urinary tract infection. She has history of kidney stones and  reports that this feels similar. CT abdomen and pelvis without contrast was done to rule out an infected stone. There is no stone in her ureter. Patient is afebrile. No vomiting. Will discharge patient with antibiotics. Some concern for early pyelonephritis given flank pain. She was given IM rocephin in the ED. She is to followup with urology as needed. Discussed reasons to return to the emergency department immediately. Vital signs stable for discharge. Discussed case with Dr. Sharol Given who agrees with plan. Patient / Family / Caregiver informed of clinical course, understand medical decision-making process, and agree with plan.    Elwyn Lade, PA-C 06/09/14 0206

## 2014-06-09 NOTE — Discharge Instructions (Signed)

## 2014-06-10 LAB — URINE CULTURE
CULTURE: NO GROWTH
Colony Count: NO GROWTH

## 2014-11-12 ENCOUNTER — Emergency Department (HOSPITAL_COMMUNITY)
Admission: EM | Admit: 2014-11-12 | Discharge: 2014-11-12 | Disposition: A | Discharge status: Home / Self Care | Payer: BC Managed Care – PPO | Attending: Emergency Medicine | Admitting: Emergency Medicine

## 2014-11-12 ENCOUNTER — Emergency Department (HOSPITAL_COMMUNITY): Payer: BC Managed Care – PPO

## 2014-11-12 ENCOUNTER — Encounter (HOSPITAL_COMMUNITY): Payer: Self-pay | Admitting: *Deleted

## 2014-11-12 DIAGNOSIS — R1030 Lower abdominal pain, unspecified: Secondary | ICD-10-CM | POA: Diagnosis present

## 2014-11-12 DIAGNOSIS — Z792 Long term (current) use of antibiotics: Secondary | ICD-10-CM | POA: Diagnosis not present

## 2014-11-12 DIAGNOSIS — K219 Gastro-esophageal reflux disease without esophagitis: Secondary | ICD-10-CM | POA: Diagnosis not present

## 2014-11-12 DIAGNOSIS — Z87442 Personal history of urinary calculi: Secondary | ICD-10-CM

## 2014-11-12 DIAGNOSIS — N200 Calculus of kidney: Secondary | ICD-10-CM | POA: Diagnosis not present

## 2014-11-12 DIAGNOSIS — R109 Unspecified abdominal pain: Secondary | ICD-10-CM

## 2014-11-12 DIAGNOSIS — R319 Hematuria, unspecified: Secondary | ICD-10-CM

## 2014-11-12 DIAGNOSIS — Z8619 Personal history of other infectious and parasitic diseases: Secondary | ICD-10-CM | POA: Insufficient documentation

## 2014-11-12 DIAGNOSIS — G2581 Restless legs syndrome: Secondary | ICD-10-CM | POA: Diagnosis not present

## 2014-11-12 DIAGNOSIS — E039 Hypothyroidism, unspecified: Secondary | ICD-10-CM | POA: Insufficient documentation

## 2014-11-12 DIAGNOSIS — Z79899 Other long term (current) drug therapy: Secondary | ICD-10-CM | POA: Diagnosis not present

## 2014-11-12 LAB — BASIC METABOLIC PANEL
Anion gap: 6 (ref 5–15)
BUN: 16 mg/dL (ref 6–23)
CHLORIDE: 106 meq/L (ref 96–112)
CO2: 29 mmol/L (ref 19–32)
CREATININE: 0.87 mg/dL (ref 0.50–1.10)
Calcium: 9 mg/dL (ref 8.4–10.5)
GFR calc non Af Amer: 79 mL/min — ABNORMAL LOW (ref >90)
GLUCOSE: 84 mg/dL (ref 70–99)
Potassium: 4.1 mmol/L (ref 3.5–5.1)
Sodium: 141 mmol/L (ref 135–145)

## 2014-11-12 LAB — CBC WITH DIFFERENTIAL/PLATELET
BASOS PCT: 1 % (ref 0–1)
Basophils Absolute: 0 10*3/uL (ref 0.0–0.1)
EOS ABS: 0.3 10*3/uL (ref 0.0–0.7)
Eosinophils Relative: 6 % — ABNORMAL HIGH (ref 0–5)
HEMATOCRIT: 37.8 % (ref 36.0–46.0)
HEMOGLOBIN: 12.1 g/dL (ref 12.0–15.0)
LYMPHS ABS: 2.9 10*3/uL (ref 0.7–4.0)
Lymphocytes Relative: 48 % — ABNORMAL HIGH (ref 12–46)
MCH: 27.6 pg (ref 26.0–34.0)
MCHC: 32 g/dL (ref 30.0–36.0)
MCV: 86.1 fL (ref 78.0–100.0)
MONO ABS: 0.4 10*3/uL (ref 0.1–1.0)
MONOS PCT: 7 % (ref 3–12)
Neutro Abs: 2.3 10*3/uL (ref 1.7–7.7)
Neutrophils Relative %: 38 % — ABNORMAL LOW (ref 43–77)
Platelets: 158 10*3/uL (ref 150–400)
RBC: 4.39 MIL/uL (ref 3.87–5.11)
RDW: 13.8 % (ref 11.5–15.5)
WBC: 6 10*3/uL (ref 4.0–10.5)

## 2014-11-12 LAB — URINE MICROSCOPIC-ADD ON

## 2014-11-12 LAB — I-STAT CG4 LACTIC ACID, ED: Lactic Acid, Venous: <0.3 mmol/L — ABNORMAL LOW (ref 0.5–2.2)

## 2014-11-12 LAB — URINALYSIS, ROUTINE W REFLEX MICROSCOPIC
GLUCOSE, UA: NEGATIVE mg/dL
Ketones, ur: NEGATIVE mg/dL
Nitrite: NEGATIVE
PH: 5.5 (ref 5.0–8.0)
Protein, ur: 100 mg/dL — AB
SPECIFIC GRAVITY, URINE: 1.018 (ref 1.005–1.030)
Urobilinogen, UA: 0.2 mg/dL (ref 0.0–1.0)

## 2014-11-12 MED ORDER — ONDANSETRON 8 MG PO TBDP: 8.0000 mg | ORAL_TABLET | Freq: Three times a day (TID) | ORAL | Status: DC | PRN

## 2014-11-12 MED ORDER — HYDROMORPHONE HCL 1 MG/ML IJ SOLN
1.0000 mg | Freq: Once | INTRAMUSCULAR | Status: AC
  Administered 2014-11-12: 1 mg via INTRAVENOUS
  Filled 2014-11-12: qty 1

## 2014-11-12 MED ORDER — SODIUM CHLORIDE 0.9 % IV BOLUS (SEPSIS)
1000.0000 mL | Freq: Once | INTRAVENOUS | Status: AC
  Administered 2014-11-12: 1000 mL via INTRAVENOUS

## 2014-11-12 MED ORDER — ONDANSETRON HCL 4 MG/2ML IJ SOLN
4.0000 mg | Freq: Once | INTRAMUSCULAR | Status: AC
  Administered 2014-11-12: 4 mg via INTRAVENOUS
  Filled 2014-11-12: qty 2

## 2014-11-12 MED ORDER — KETOROLAC TROMETHAMINE 30 MG/ML IJ SOLN
30.0000 mg | Freq: Once | INTRAMUSCULAR | Status: AC
  Administered 2014-11-12: 30 mg via INTRAVENOUS
  Filled 2014-11-12: qty 1

## 2014-11-12 MED ORDER — CEPHALEXIN 500 MG PO CAPS: 500.0000 mg | ORAL_CAPSULE | Freq: Three times a day (TID) | ORAL | Status: DC

## 2014-11-12 MED ORDER — DEXTROSE 5 % IV SOLN
1.0000 g | Freq: Once | INTRAVENOUS | Status: AC
  Administered 2014-11-12: 1 g via INTRAVENOUS
  Filled 2014-11-12: qty 10

## 2014-11-12 NOTE — ED Provider Notes (Signed)
CSN: [id number]     Arrival date & time 11/12/14  1200 History   First MD Initiated Contact with Patient 11/12/14 1252     Chief Complaint  Patient presents with  . Flank Pain      HPI Patient presents complaining of bilateral flank as well as lower abdominal pain over the past 5-6 days.  She was seen by her primary care physician 5 days ago followed by urology for days ago.  She's been on ciprofloxacin.  She continues to have suprapubic abdominal pain as well as chills at home.  No documented fever.  She states she is still having discomfort.  She has a history of kidney stones.  She reports to me that an ultrasound was performed in the urology office.  I do not have those results.  She does report that she feels like she needs to strain to urinate.  She has nausea without vomiting.  Her symptoms are mild to moderate in severity.  Nothing seems to be improving them.   Past Medical History  Diagnosis Date  . Right ureteral stone   . RLS (restless legs syndrome)   . Acid reflux   . Constipation PT STATES HAS A TEAR AT RECTAL AREA  . Frequency of urination   . Urgency of urination   . Hematuria   . Renal calculi BILATERAL  . History of kidney stones   . PONV (postoperative nausea and vomiting)   . Hypothyroidism   . H. pylori infection    Past Surgical History  Procedure Laterality Date  . Laparoscopic assisted vaginal hysterectomy  MARCH 2014    W/ BILATERAL SALPINGOOPHORECTOMY  . Cholecystectomy    . Laparoscopic gastric banding  SEPT 2011  . Tonsillectomy and adenoidectomy  AGE 43'S  . Ureterolithotomy  NOV 2013  . Cystoscopy/retrograde/ureteroscopy Right 03/31/2013    Procedure: CYSTOSCOPY/RETROGRADE/URETEROSCOPY;  Surgeon: Bernestine Amass, MD;  Location: [facility];  Service: Urology;  Laterality: Right;  . Holmium laser application Right 06/27/1750    Procedure: HOLMIUM LASER APPLICATION;  Surgeon: Bernestine Amass, MD;  Location: [facility];   Service: Urology;  Laterality: Right;  . Gastric bypass  03/15/14   No family history on file. History  Substance Use Topics  . Smoking status: Never Smoker   . Smokeless tobacco: Never Used  . Alcohol Use: No   OB History    No data available     Review of Systems  All other systems reviewed and are negative.     Allergies  Clindamycin/lincomycin; Nsaids; and Percocet  Home Medications   Prior to Admission medications   Medication Sig Start Date End Date Taking? Authorizing Provider  ciprofloxacin (CIPRO) 500 MG tablet Take 500 mg by mouth 2 (two) times daily.  11/08/14  Yes Historical Provider, MD  Cyanocobalamin (VITAMIN B-12) 1000 MCG SUBL Place 1,000 mcg under the tongue daily.   Yes Historical Provider, MD  cyclobenzaprine (FLEXERIL) 10 MG tablet Take 10 mg by mouth 3 (three) times daily as needed for muscle spasms.   Yes Historical Provider, MD  gabapentin (NEURONTIN) 300 MG capsule Take 300 mg by mouth 3 (three) times daily.  09/06/14  Yes Historical Provider, MD  HYDROcodone-acetaminophen (NORCO/VICODIN) 5-325 MG per tablet Take 1-2 tablets by mouth every 6 (six) hours as needed for moderate pain or severe pain. 06/09/14  Yes Elwyn Lade, PA-C  HYDROmorphone (DILAUDID) 2 MG tablet Take 2-4 mg by mouth every 6 (six) hours as needed for  severe pain (pain).  11/09/14  Yes Historical Provider, MD  lansoprazole (PREVACID) 30 MG capsule Take 30 mg by mouth 2 (two) times daily before a meal.   Yes Historical Provider, MD  levothyroxine (LEVOTHROID) 88 MCG tablet Take 88 mcg by mouth daily before breakfast.   Yes Historical Provider, MD  Multiple Vitamin (MULTIVITAMIN WITH MINERALS) TABS tablet Take 1 tablet by mouth daily.   Yes Historical Provider, MD  sertraline (ZOLOFT) 100 MG tablet Take 100 mg by mouth at bedtime.   Yes Historical Provider, MD  tamsulosin (FLOMAX) 0.4 MG CAPS capsule Take 0.4 mg by mouth daily after supper.  11/09/14  Yes Historical Provider, MD   traZODone (DESYREL) 50 MG tablet Take 50 mg by mouth at bedtime.   Yes Historical Provider, MD  cephALEXin (KEFLEX) 500 MG capsule Take 1 capsule (500 mg total) by mouth 4 (four) times daily. Patient not taking: Reported on 11/12/2014 06/09/14   Elwyn Lade, PA-C  ondansetron (ZOFRAN) 4 MG tablet Take 1 tablet (4 mg total) by mouth every 6 (six) hours. Patient not taking: Reported on 11/12/2014 06/09/14   Elwyn Lade, PA-C   BP 86/42 mmHg  Pulse 62  Temp(Src) 98 F (36.7 C) (Oral)  Resp 20  SpO2 99% Physical Exam  Constitutional: She is oriented to person, place, and time. She appears well-developed and well-nourished. No distress.  HENT:  Head: Normocephalic and atraumatic.  Eyes: EOM are normal.  Neck: Normal range of motion.  Cardiovascular: Normal rate, regular rhythm and normal heart sounds.   Pulmonary/Chest: Effort normal and breath sounds normal.  Abdominal: Soft. She exhibits no distension. There is no tenderness.  Genitourinary:  Mild bilateral CVA tenderness.  Musculoskeletal: Normal range of motion.  Neurological: She is alert and oriented to person, place, and time.  Skin: Skin is warm and dry.  Psychiatric: She has a normal mood and affect. Judgment normal.  Nursing note and vitals reviewed.   ED Course  Procedures  Labs Review Labs Reviewed  URINALYSIS, ROUTINE W REFLEX MICROSCOPIC - Abnormal; Notable for the following:    Color, Urine RED (*)    APPearance TURBID (*)    Hgb urine dipstick LARGE (*)    Bilirubin Urine SMALL (*)    Protein, ur 100 (*)    Leukocytes, UA SMALL (*)    All other components within normal limits  URINE MICROSCOPIC-ADD ON - Abnormal; Notable for the following:    Bacteria, UA MANY (*)    All other components within normal limits  URINE CULTURE  CBC WITH DIFFERENTIAL  BASIC METABOLIC PANEL    Imaging Review Ct Abdomen Pelvis Wo Contrast  11/12/2014   CLINICAL DATA:  Increased acute back and flank pain for 1 week.  History of nephrolithiasis.  EXAM: CT ABDOMEN AND PELVIS WITHOUT CONTRAST  TECHNIQUE: Multidetector CT imaging of the abdomen and pelvis was performed following the standard protocol without IV contrast.  COMPARISON:  06/09/2014  FINDINGS: Lower chest: Minor dependent basilar atelectasis. Otherwise lower lobes are clear. Normal heart size. No pericardial or pleural effusion. No significant hiatal hernia. Bilateral breast implants present.  Abdomen:  Prior gastric bypass surgery and cholecystectomy.  Liver, biliary system, pancreas, spleen, and adrenal glands are within normal limits for age and noncontrast imaging.  Kidneys demonstrate punctate nonobstructing small intrarenal calculi bilaterally. No hydronephrosis. Left kidney demonstrates an 8 x 5 mm left UPJ calculus with minimal left pelviectasis. No additional ureteral calculi on either side. Right kidney and ureter demonstrate  no acute process or obstruction.  No abdominal free fluid, fluid collection, hemorrhage, abscess, or adenopathy.  Negative for bowel obstruction, dilatation, ileus, or free air.  Pelvis: Prior hysterectomy. Low-lying cecum extending into the pelvis. Normal-appearing appendix. Trace pelvic free fluid likely physiologic. Distal colon is stool filled otherwise no acute distal bowel process. No pelvic fluid collection, hemorrhage, abscess, adenopathy, inguinal abnormality, or hernia. Urinary bladder unremarkable.  No acute osseous finding.  IMPRESSION: 8 x 5 mm left proximal UPJ calculus with mild associated pelviectasis.  Punctate nonobstructing intrarenal calculi bilaterally.  Prior gastric bypass, cholecystectomy, and hysterectomy.   Electronically Signed   By: Daryll Brod M.D.   On: 11/12/2014 14:06  I personally reviewed the imaging tests through PACS system I reviewed available ER/hospitalization records through the EMR    EKG Interpretation None      MDM   Final diagnoses:  Hematuria  Abdominal pain  History of kidney  stones    4:16 PM Patient is feeling much better at this time.  Discharge home in good condition.  Will switch antibiotics from ciprofloxacin to Keflex.  She was given a dose of IV Rocephin the emergency department.  I discussed her care with Dr. Sabra Heck who reviewed her CT scan as well as her laboratory studies in the emergency department.  Patient was given the option of admission the hospital or discharge and she preferred to go home at this time.  She understands to return the emergency department for new or worsening symptoms.  Lactate reassuring.  White blood cell count reassuring.  Feels better after IV fluids.    Hoy Morn, MD 11/12/14 [phone]

## 2014-11-12 NOTE — ED Notes (Signed)
Other 0.5mg  of Dilaudid given. BP 103/58 heart rate 65

## 2014-11-12 NOTE — ED Notes (Signed)
MD made aware of BP. Verbal order for Bolus

## 2014-11-12 NOTE — ED Notes (Signed)
Patient complains of back, bilateral flank. and lower abdominal pain. She was seen by a urologist last week and was x-rayed that showed bilateral kidney stones. She is to follow up with urology next week. She was sent home with a prescription for dilaudid and the pain is not any better. She has to strain to urinate. She was advised to come to the ED is pain continued to get worse.

## 2014-11-12 NOTE — ED Notes (Signed)
Pt ambulated to restroom with steady gait and denies dizziness.  Per MD Venora Maples give 0.5mg  of dilaudid monitor BP then give the other half if BP maintains

## 2014-11-13 LAB — URINE CULTURE
Colony Count: NO GROWTH
Culture: NO GROWTH

## 2014-12-22 ENCOUNTER — Encounter (HOSPITAL_BASED_OUTPATIENT_CLINIC_OR_DEPARTMENT_OTHER): Payer: Self-pay | Admitting: *Deleted

## 2014-12-22 ENCOUNTER — Other Ambulatory Visit: Payer: Self-pay | Admitting: Urology

## 2014-12-22 NOTE — Progress Notes (Signed)
Pt instructed npo p mn 2/7x prevacid, neurontin w sip of water.  May take pain/nausea med w sip of water prn.  Pt to bring imitrex dose with her.  To [facility] 2/8 @ 0915.  Needs hgb on arrival.  Ekg,cxr in epic.

## 2014-12-26 ENCOUNTER — Ambulatory Visit (HOSPITAL_BASED_OUTPATIENT_CLINIC_OR_DEPARTMENT_OTHER): Payer: BC Managed Care – PPO | Admitting: Anesthesiology

## 2014-12-26 ENCOUNTER — Ambulatory Visit (HOSPITAL_BASED_OUTPATIENT_CLINIC_OR_DEPARTMENT_OTHER)
Admission: RE | Admit: 2014-12-26 | Discharge: 2014-12-26 | Disposition: A | Discharge status: Home / Self Care | Payer: BC Managed Care – PPO | Source: Ambulatory Visit | Attending: Urology | Admitting: Urology

## 2014-12-26 ENCOUNTER — Encounter (HOSPITAL_BASED_OUTPATIENT_CLINIC_OR_DEPARTMENT_OTHER): Payer: Self-pay

## 2014-12-26 ENCOUNTER — Encounter (HOSPITAL_BASED_OUTPATIENT_CLINIC_OR_DEPARTMENT_OTHER)
Admission: RE | Disposition: A | Discharge status: Home / Self Care | Payer: Self-pay | Source: Ambulatory Visit | Attending: Urology

## 2014-12-26 DIAGNOSIS — N201 Calculus of ureter: Secondary | ICD-10-CM | POA: Diagnosis not present

## 2014-12-26 DIAGNOSIS — Z888 Allergy status to other drugs, medicaments and biological substances status: Secondary | ICD-10-CM | POA: Diagnosis not present

## 2014-12-26 DIAGNOSIS — G2581 Restless legs syndrome: Secondary | ICD-10-CM | POA: Diagnosis not present

## 2014-12-26 DIAGNOSIS — E039 Hypothyroidism, unspecified: Secondary | ICD-10-CM | POA: Diagnosis not present

## 2014-12-26 DIAGNOSIS — K219 Gastro-esophageal reflux disease without esophagitis: Secondary | ICD-10-CM | POA: Insufficient documentation

## 2014-12-26 HISTORY — PX: CYSTOSCOPY WITH RETROGRADE PYELOGRAM, URETEROSCOPY AND STENT PLACEMENT: SHX5789

## 2014-12-26 HISTORY — DX: Headache: R51

## 2014-12-26 HISTORY — PX: HOLMIUM LASER APPLICATION: SHX5852

## 2014-12-26 HISTORY — DX: Family history of other specified conditions: Z84.89

## 2014-12-26 HISTORY — DX: Headache, unspecified: R51.9

## 2014-12-26 SURGERY — CYSTOURETEROSCOPY, WITH RETROGRADE PYELOGRAM AND STENT INSERTION
Anesthesia: General | Site: Ureter | Laterality: Left

## 2014-12-26 MED ORDER — DEXAMETHASONE SODIUM PHOSPHATE 4 MG/ML IJ SOLN
INTRAMUSCULAR | Status: DC | PRN
  Administered 2014-12-26: 10 mg via INTRAVENOUS

## 2014-12-26 MED ORDER — MIDAZOLAM HCL 5 MG/5ML IJ SOLN
INTRAMUSCULAR | Status: DC | PRN
  Administered 2014-12-26: 2 mg via INTRAVENOUS

## 2014-12-26 MED ORDER — URIBEL 118 MG PO CAPS: 1.0000 | ORAL_CAPSULE | Freq: Three times a day (TID) | ORAL | Status: DC | PRN

## 2014-12-26 MED ORDER — FENTANYL CITRATE 0.05 MG/ML IJ SOLN
INTRAMUSCULAR | Status: DC | PRN
  Administered 2014-12-26 (×2): 25 ug via INTRAVENOUS
  Administered 2014-12-26: 50 ug via INTRAVENOUS

## 2014-12-26 MED ORDER — LACTATED RINGERS IV SOLN
INTRAVENOUS | Status: DC
  Administered 2014-12-26: 10:00:00 via INTRAVENOUS
  Filled 2014-12-26: qty 1000

## 2014-12-26 MED ORDER — URELLE 81 MG PO TABS
1.0000 | ORAL_TABLET | Freq: Four times a day (QID) | ORAL | Status: DC
  Administered 2014-12-26: 81 mg via ORAL
  Filled 2014-12-26: qty 1

## 2014-12-26 MED ORDER — ONDANSETRON HCL 4 MG/2ML IJ SOLN
INTRAMUSCULAR | Status: DC | PRN
  Administered 2014-12-26: 4 mg via INTRAVENOUS

## 2014-12-26 MED ORDER — HYDROMORPHONE HCL 2 MG PO TABS
ORAL_TABLET | ORAL | Status: AC
  Filled 2014-12-26: qty 1

## 2014-12-26 MED ORDER — LIDOCAINE HCL 2 % EX GEL
CUTANEOUS | Status: DC | PRN
  Administered 2014-12-26: 1 via URETHRAL

## 2014-12-26 MED ORDER — KETOROLAC TROMETHAMINE 30 MG/ML IJ SOLN
INTRAMUSCULAR | Status: DC | PRN
  Administered 2014-12-26: 30 mg via INTRAVENOUS

## 2014-12-26 MED ORDER — ACETAMINOPHEN 10 MG/ML IV SOLN
INTRAVENOUS | Status: DC | PRN
  Administered 2014-12-26: 1000 mg via INTRAVENOUS

## 2014-12-26 MED ORDER — FENTANYL CITRATE 0.05 MG/ML IJ SOLN
INTRAMUSCULAR | Status: AC
  Filled 2014-12-26: qty 4

## 2014-12-26 MED ORDER — CEFAZOLIN SODIUM 1-5 GM-% IV SOLN
1.0000 g | INTRAVENOUS | Status: DC
  Filled 2014-12-26: qty 50

## 2014-12-26 MED ORDER — LACTATED RINGERS IV SOLN
INTRAVENOUS | Status: DC
  Administered 2014-12-26: 12:00:00 via INTRAVENOUS
  Filled 2014-12-26: qty 1000

## 2014-12-26 MED ORDER — CEFAZOLIN SODIUM-DEXTROSE 2-3 GM-% IV SOLR
2.0000 g | INTRAVENOUS | Status: AC
  Administered 2014-12-26: 2 g via INTRAVENOUS
  Filled 2014-12-26: qty 50

## 2014-12-26 MED ORDER — PROPOFOL 10 MG/ML IV BOLUS
INTRAVENOUS | Status: DC | PRN
  Administered 2014-12-26: 200 mg via INTRAVENOUS

## 2014-12-26 MED ORDER — FENTANYL CITRATE 0.05 MG/ML IJ SOLN
25.0000 ug | INTRAMUSCULAR | Status: DC | PRN
  Filled 2014-12-26: qty 1

## 2014-12-26 MED ORDER — IOHEXOL 350 MG/ML SOLN
INTRAVENOUS | Status: DC | PRN
  Administered 2014-12-26: 10 mL

## 2014-12-26 MED ORDER — SCOPOLAMINE 1 MG/3DAYS TD PT72
1.0000 | MEDICATED_PATCH | TRANSDERMAL | Status: DC
  Administered 2014-12-26: 1.5 mg via TRANSDERMAL
  Filled 2014-12-26: qty 1

## 2014-12-26 MED ORDER — PROMETHAZINE HCL 25 MG/ML IJ SOLN
6.2500 mg | INTRAMUSCULAR | Status: DC | PRN
  Filled 2014-12-26: qty 1

## 2014-12-26 MED ORDER — URELLE 81 MG PO TABS
ORAL_TABLET | ORAL | Status: AC
  Filled 2014-12-26: qty 1

## 2014-12-26 MED ORDER — HYDROMORPHONE HCL 2 MG PO TABS
2.0000 mg | ORAL_TABLET | Freq: Four times a day (QID) | ORAL | Status: DC | PRN
  Administered 2014-12-26: 2 mg via ORAL
  Filled 2014-12-26: qty 1

## 2014-12-26 MED ORDER — SCOPOLAMINE 1 MG/3DAYS TD PT72
MEDICATED_PATCH | TRANSDERMAL | Status: AC
Start: 2014-12-26 — End: 2014-12-26
  Filled 2014-12-26: qty 1

## 2014-12-26 MED ORDER — HYDROCODONE-ACETAMINOPHEN 5-325 MG PO TABS: 1.0000 | ORAL_TABLET | Freq: Four times a day (QID) | ORAL | Status: DC | PRN

## 2014-12-26 MED ORDER — SODIUM CHLORIDE 0.9 % IR SOLN
Status: DC | PRN
  Administered 2014-12-26: 4000 mL

## 2014-12-26 MED ORDER — MIDAZOLAM HCL 2 MG/2ML IJ SOLN
INTRAMUSCULAR | Status: AC
  Filled 2014-12-26: qty 2

## 2014-12-26 MED ORDER — MEPERIDINE HCL 25 MG/ML IJ SOLN
6.2500 mg | INTRAMUSCULAR | Status: DC | PRN
  Filled 2014-12-26: qty 1

## 2014-12-26 SURGICAL SUPPLY — 37 items
ADAPTER CATH URET PLST 4-6FR (CATHETERS) IMPLANT
BAG DRAIN URO-CYSTO SKYTR STRL (DRAIN) ×2 IMPLANT
BASKET LASER NITINOL 1.9FR (BASKET) IMPLANT
BASKET STNLS GEMINI 4WIRE 3FR (BASKET) IMPLANT
BASKET ZERO TIP NITINOL 2.4FR (BASKET) ×2 IMPLANT
BENZOIN TINCTURE PRP APPL 2/3 (GAUZE/BANDAGES/DRESSINGS) IMPLANT
CANISTER SUCT LVC 12 LTR MEDI- (MISCELLANEOUS) ×2 IMPLANT
CATH INTERMIT  6FR 70CM (CATHETERS) IMPLANT
CATH URET 5FR 28IN CONE TIP (BALLOONS)
CATH URET 5FR 28IN OPEN ENDED (CATHETERS) ×2 IMPLANT
CATH URET 5FR 70CM CONE TIP (BALLOONS) IMPLANT
CLOTH BEACON ORANGE TIMEOUT ST (SAFETY) ×2 IMPLANT
DRAPE CAMERA CLOSED 9X96 (DRAPES) ×2 IMPLANT
DRSG TEGADERM 2-3/8X2-3/4 SM (GAUZE/BANDAGES/DRESSINGS) IMPLANT
FIBER LASER FLEXIVA 1000 (UROLOGICAL SUPPLIES) IMPLANT
FIBER LASER FLEXIVA 200 (UROLOGICAL SUPPLIES) IMPLANT
FIBER LASER FLEXIVA 365 (UROLOGICAL SUPPLIES) ×2 IMPLANT
FIBER LASER FLEXIVA 550 (UROLOGICAL SUPPLIES) IMPLANT
GLOVE BIO SURGEON STRL SZ 6.5 (GLOVE) ×2 IMPLANT
GLOVE BIO SURGEON STRL SZ7.5 (GLOVE) ×2 IMPLANT
GLOVE INDICATOR 6.5 STRL GRN (GLOVE) ×2 IMPLANT
GOWN STRL REIN XL XLG (GOWN DISPOSABLE) ×2 IMPLANT
GOWN STRL REUS W/ TWL LRG LVL3 (GOWN DISPOSABLE) ×1 IMPLANT
GOWN STRL REUS W/TWL LRG LVL3 (GOWN DISPOSABLE) ×1
GOWN STRL REUS W/TWL XL LVL3 (GOWN DISPOSABLE) ×2 IMPLANT
GUIDEWIRE 0.038 PTFE COATED (WIRE) IMPLANT
GUIDEWIRE ANG ZIPWIRE 038X150 (WIRE) IMPLANT
GUIDEWIRE STR DUAL SENSOR (WIRE) ×2 IMPLANT
IV NS IRRIG 3000ML ARTHROMATIC (IV SOLUTION) ×2 IMPLANT
KIT BALLIN UROMAX 15FX10 (LABEL) IMPLANT
KIT BALLN UROMAX 15FX4 (MISCELLANEOUS) IMPLANT
KIT BALLN UROMAX 26 75X4 (MISCELLANEOUS)
NS IRRIG 500ML POUR BTL (IV SOLUTION) ×2 IMPLANT
PACK CYSTO (CUSTOM PROCEDURE TRAY) ×2 IMPLANT
SET HIGH PRES BAL DIL (LABEL)
SHEATH ACCESS URETERAL 38CM (SHEATH) IMPLANT
STENT URET 6FRX24 CONTOUR (STENTS) ×2 IMPLANT

## 2014-12-26 NOTE — Interval H&P Note (Signed)
History and Physical Interval Note:  12/26/2014 11:37 AM  Morgan [name]  has presented today for surgery, with the diagnosis of LEFT URETERAL CALCULUS  The various methods of treatment have been discussed with the patient and family. After consideration of risks, benefits and other options for treatment, the patient has consented to  Procedure(s): CYSTOSCOPY WITH RETROGRADE PYELOGRAM, URETEROSCOPY AND STENT PLACEMENT (Left) HOLMIUM LASER APPLICATION (Left) as a surgical intervention .  The patient's history has been reviewed, patient examined, no change in status, stable for surgery.  I have reviewed the patient's chart and labs.  Questions were answered to the patient's satisfaction.     Grant Swager S

## 2014-12-26 NOTE — Discharge Instructions (Signed)
[facility] °[phone] °Post Ureteroscopy With or Without Stent Instructions ° °Definitions: ° °Ureter: The duct that transports urine from the kidney to the bladder. °Stent:   A plastic hollow tube that is placed into the ureter, from the kidney to the                 bladder to prevent the ureter from swelling shut. ° °GENERAL INSTRUCTIONS: ° °Despite the fact that no skin incisions were used, the area around the ureter and bladder is raw and irritated. The stent is a foreign body which will further irritate the bladder wall. This irritation is manifested by increased frequency of urination, both day and night, and by an increase in the urge to urinate. In some, the urge to urinate is present almost always. Sometimes the urge is strong enough that you may not be able to stop yourself from urinating. The only real cure is to remove the stent and then give time for the bladder wall to heal which can't be done until the danger of the ureter swelling shut has passed, which varies. ° °You may see some blood in your urine while the stent is in place and a few days afterwards. Do not be alarmed, even if the urine was clear for a while. Get off your feet and drink lots of fluids until clearing occurs. If you start to pass clots or don't improve, call us. ° °DIET: °You may return to your normal diet immediately. Because of the raw surface of your bladder, alcohol, spicy foods, acid type foods and drinks with caffeine may cause irritation or frequency and should be used in moderation. To keep your urine flowing freely and to avoid constipation, drink plenty of fluids during the day ( 8-10 glasses ). °Tip: Avoid cranberry juice because it is very acidic. ° °ACTIVITY: °Your physical activity doesn't need to be restricted. However, if you are very active, you may see some blood in your urine. We suggest that you reduce your activity under these circumstances until the bleeding has stopped. ° °BOWELS: °It is  important to keep your bowels regular during the postoperative period. Straining with bowel movements can cause bleeding. A bowel movement every other day is reasonable. Use a mild laxative if needed, such as Milk of Magnesia 2-3 tablespoons, or 2 Dulcolax tablets. Call if you continue to have problems. If you have been taking narcotics for pain, before, during or after your surgery, you may be constipated. Take a laxative if necessary. ° ° °MEDICATION: °You should resume your pre-surgery medications unless told not to. In addition you will often be given an antibiotic to prevent infection. These should be taken as prescribed until the bottles are finished unless you are having an unusual reaction to one of the drugs. ° °PROBLEMS YOU SHOULD REPORT TO US: °· Fevers over 100.5 Fahrenheit. °· Heavy bleeding, or clots ( See above notes about blood in urine ). °· Inability to urinate. °· Drug reactions ( hives, rash, nausea, vomiting, diarrhea ). °· Severe burning or pain with urination that is not improving. ° °FOLLOW-UP: °You will need a follow-up appointment to monitor your progress. Call for this appointment at the number listed above. Usually the first appointment will be about three to fourteen days after your surgery. ° ° ° ° ° °Post Anesthesia Home Care Instructions ° °Activity: °Get plenty of rest for the remainder of the day. A responsible adult should stay with you for 24 hours following the procedure.  °  For the next 24 hours, DO NOT: -Drive a car -Paediatric nurse -Drink alcoholic beverages -Take any medication unless instructed by your physician -Make any legal decisions or sign important papers.  Meals: Start with liquid foods such as gelatin or soup. Progress to regular foods as tolerated. Avoid greasy, spicy, heavy foods. If nausea and/or vomiting occur, drink only clear liquids until the nausea and/or vomiting subsides. Call your physician if vomiting continues.  Special  Instructions/Symptoms: Your throat may feel dry or sore from the anesthesia or the breathing tube placed in your throat during surgery. If this causes discomfort, gargle with warm salt water. The discomfort should disappear within 24 hours. [facility] [phone] Post Ureteroscopy With or Without Stent Instructions  Definitions:  Ureter: The duct that transports urine from the kidney to the bladder. Stent:   A plastic hollow tube that is placed into the ureter, from the kidney to the                 bladder to prevent the ureter from swelling shut.  GENERAL INSTRUCTIONS:  Despite the fact that no skin incisions were used, the area around the ureter and bladder is raw and irritated. The stent is a foreign body which will further irritate the bladder wall. This irritation is manifested by increased frequency of urination, both day and night, and by an increase in the urge to urinate. In some, the urge to urinate is present almost always. Sometimes the urge is strong enough that you may not be able to stop yourself from urinating. The only real cure is to remove the stent and then give time for the bladder wall to heal which can't be done until the danger of the ureter swelling shut has passed, which varies.  You may see some blood in your urine while the stent is in place and a few days afterwards. Do not be alarmed, even if the urine was clear for a while. Get off your feet and drink lots of fluids until clearing occurs. If you start to pass clots or don't improve, call us.  DIET: You may return to your normal diet immediately. Because of the raw surface of your bladder, alcohol, spicy foods, acid type foods and drinks with caffeine may cause irritation or frequency and should be used in moderation. To keep your urine flowing freely and to avoid constipation, drink plenty of fluids during the day ( 8-10 glasses ). Tip: Avoid cranberry juice because it is very  acidic.  ACTIVITY: Your physical activity doesn't need to be restricted. However, if you are very active, you may see some blood in your urine. We suggest that you reduce your activity under these circumstances until the bleeding has stopped.  BOWELS: It is important to keep your bowels regular during the postoperative period. Straining with bowel movements can cause bleeding. A bowel movement every other day is reasonable. Use a mild laxative if needed, such as Milk of Magnesia 2-3 tablespoons, or 2 Dulcolax tablets. Call if you continue to have problems. If you have been taking narcotics for pain, before, during or after your surgery, you may be constipated. Take a laxative if necessary.   MEDICATION: You should resume your pre-surgery medications unless told not to. In addition you will often be given an antibiotic to prevent infection. These should be taken as prescribed until the bottles are finished unless you are having an unusual reaction to one of the drugs.  PROBLEMS YOU SHOULD REPORT TO [place]:  Fevers over 100.5 Fahrenheit.  Heavy bleeding, or clots ( See above notes about blood in urine ).  Inability to urinate.  Drug reactions ( hives, rash, nausea, vomiting, diarrhea ).  Severe burning or pain with urination that is not improving.  FOLLOW-UP: You will need a follow-up appointment to monitor your progress. Call for this appointment at the number listed above. Usually the first appointment will be about three to fourteen days after your surgery.

## 2014-12-26 NOTE — H&P (Signed)
Reason For Visit Morgan [name] presents today to discuss ongoing management of a recently diagnosed 6 x 9 mm left proximal ureteral calculus. Her prior urologic history and more recent developments are summarized in the section below from her recent visit with Morgan [name]. I reviewed her CT images from December and February. I have also reviewed her KUB. I do believe the stone is visible on plain imaging.   Still with moderate ongoing pain and nausea. No fever.   History of Present Illness            [name] patient of Morgan [name] seen 12-20-14 as a work-in for (L) flank pain.    GU Hx:  Last seen 2014 hx of nephrolithiasis. She estimates she has had about 50 clinical stone events in her life. She has required surgical intervention on 3 occasions most recently with a right cysto/ureteroscopy in May 2014.     Interval history 2014:  Presented with right flank pain. This has been present for about 1 week. Nothing makes it better or worse. She presented to the ER on 8/26 with the same symptoms. A CT showed bilateral non obstructing stones measuring 2-3 mm. No ureteral stones were seen. No other explanation for her pain was found. This is a similar report to that of a CT she had earlier in August. She has had some straining associated with urination. She does not feel like she empties. She has been taking flomax and this does not seem to help. She denies other LUTS, dysuria, or hematuria. She denies n/v or f/c.     Dec 2015 Interval Hx:   States gross hematuria began 24 hrs ago. Now with bladder pressure. Has self treated with PRN Hydromorphone she has on hand. Denies f/c or flank pain.     Feb 2016 Interval Hx:  Pt never followed up as recommended at last appt. Has now cancelled 4 consecutive appts since 11/15/14.     She states she never followed up because w/in a few hours of last appt passed a stone (did not bring) and has been doing well until this am. She has acute on-set of (L)  flank pain at 0400 and self treated with PRN Hydromorphone she has on hand. No relief 2 hrs later and repeated dosage. Now with pain 9/10 and nausea.   Past Medical History Problems  1. History of heartburn (Z87.898) 2. History of hypothyroidism (Z86.39)  Surgical History Problems  1. History of Cystoscopy With Ureteroscopy With Lithotripsy 2. 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 HYDROmorphone HCl - 2 MG Oral Tablet; TAKE 1 TABLET EVERY 4 TO 6 HOURS AS  NEEDED FOR PAIN; Last Rx:02Feb2016 Ordered 2. Multi-Vitamin Oral Tablet;  Therapy: (Recorded:23Dec2015) to Recorded 3. Sertraline HCl - 100 MG Oral Tablet;  Therapy: [phone] to Recorded 4. Synthroid 200 MCG Oral Tablet;  Therapy: (Recorded:08May2014) to Recorded 5. Tamsulosin HCl - 0.4 MG Oral Capsule; TAKE 1 CAPSULE Daily;  Therapy: [id number] to (Last Rx:02Feb2016)  Requested for: 02Feb2016 Ordered 6. Vitamin B-12 ER TBCR; sublingual daily;  Therapy: (Recorded:23Dec2015) to Recorded  Allergies Medication  1. Clindamycin HCl CAPS 2. Percocet TABS 3. NSAIDs  Family History Problems  1. Family history of Blood In Urine : Mother 2. Family history of Cancer : Mother 3. Family history of Death In The Family Mother : Mother   68yrs, cancer 4. Family history of Diabetes Mellitus : Mother 5. Family history of Family Health Status - Father's Age :  Father   62yrs 6. Family history of Family Health Status Number Of Children   1 daughter 2. Family history of Heart Disease : Father 47. Family history of Hypertension : Mother 4. Family history of Hypertension : Father 74. Family history of Nephrolithiasis : Mother 76. Family history of Renal Failure : Mother  Social History Problems  1. Denied: History of Alcohol Use 2. Caffeine Use   1-2 qd 3. Marital History - Currently Married 4. Never A Smoker 5. Occupation:   Designer, industrial/product 6. Denied: History of Tobacco  Use  Review of Systems  Genitourinary: urinary frequency and suprapubic pain.  Gastrointestinal: nausea, flank pain and abdominal pain.  Constitutional: no fever and no night sweats.    Vitals Vital Signs [Data Includes: Last 1 Day]  Recorded: [id number] 08:57AM  Blood Pressure: 106 / 73 Temperature: 97.7 F Heart Rate: 84  Physical Exam Constitutional: Well nourished and well developed . No acute distress.  Pulmonary: No respiratory distress and normal respiratory rhythm and effort.  Cardiovascular: Heart rate and rhythm are normal . No peripheral edema.  Abdomen: The abdomen is soft and nontender. No masses are palpated. No CVA tenderness. No hernias are palpable. No hepatosplenomegaly noted.  Skin: Normal skin turgor, no visible rash and no visible skin lesions.  Neuro/Psych:. Mood and affect are appropriate.    Results/Data Selected Results  UA With REFLEX [id number] 03:02PM Morgan [name]  SPECIMEN TYPE: CLEAN CATCH   Test Name Result Flag Reference  COLOR YELLOW  YELLOW  APPEARANCE CLOUDY A CLEAR  SPECIFIC GRAVITY 1.025  1.005-1.030  pH 6.0  5.0-8.0  GLUCOSE NEG mg/dL  NEG  BILIRUBIN NEG  NEG  KETONE NEG mg/dL  NEG  BLOOD LARGE A NEG  PROTEIN TRACE mg/dL  NEG  UROBILINOGEN 0.2 mg/dL  0.0-1.0  NITRITE NEG  NEG  LEUKOCYTE ESTERASE SMALL A NEG  SQUAMOUS EPITHELIAL/HPF NONE SEEN  RARE  WBC NONE SEEN WBC/hpf  <3  RBC 21-50 RBC/hpf A <3  BACTERIA NONE SEEN  RARE  CRYSTALS NONE SEEN  NONE SEEN  CASTS NONE SEEN  NONE SEEN   AU CT-STONE PROTOCOL [id number] 12:00AM Morgan [name]   Test Name Result Flag Reference  CT-STONE PROTOCOL (Report)    ** RADIOLOGY REPORT BY  [facility] **   CLINICAL DATA: Microhematuria  EXAM: CT ABDOMEN AND PELVIS WITHOUT CONTRAST  TECHNIQUE: Multidetector CT imaging of the abdomen and pelvis was performed following the standard protocol without IV contrast.  COMPARISON: 10/23/2014  FINDINGS: Lower chest: The lung bases  are clear.  Hepatobiliary: No suspicious liver abnormality. Previous cholecystectomy. No biliary dilatation.  Pancreas: Unremarkable appearance of the pancreas.  Spleen: The visualized portions of the spleen are within normal limits.  Adrenals/Urinary Tract: The adrenal glands are normal. No right renal calculi identified. Stone within the inferior pole of the left kidney measures 3 mm, image 23/series 2. There is left pelvocaliectasis and proximal hydroureter. 6 x 9 x 6 mm stone is present within the proximal left ureter. The urinary bladder appears normal.  Stomach/Bowel: Postoperative changes from gastric bypass surgery noted. No complicating features identified. The small and large bowel loops have a normal caliber.  Vascular/Lymphatic: Normal appearance of the abdominal aorta. There is no aneurysm identified. No enlarged retroperitoneal or mesenteric adenopathy. No enlarged pelvic or inguinal lymph nodes.  Reproductive: The patient appears to be status post hysterectomy. No adnexal mass identified.  Other: There is no free fluid or fluid collections within the abdomen or pelvis.  Musculoskeletal:  Unremarkable.  IMPRESSION: 1. Proximal left ureteral calculus is noted causing proximal hydroureter and pelvocaliectasis.   Electronically Signed  By: Kerby Moors M.D.  On: 12/20/2014 16:24   Assessment Assessed  1. Calculus of ureter (N20.1) 2. Nephrolithiasis (N20.0)  Plan Calculus of ureter  1. Start: Ondansetron HCl - 4 MG Oral Tablet; TAKE 1 TABLET Every 6 hours PRN 2. Renew: HYDROmorphone HCl - 2 MG Oral Tablet; TAKE 1 TABLET EVERY 4 TO 6  HOURS AS NEEDED FOR PAIN 3. Follow-up Schedule Surgery Office  Follow-up  Status: Hold For - Appointment   Requested for: [phone] Gross hematuria, Nephrolithiasis  4. KUB; Status:Canceled;  5. Follow-up MD Office  Follow-up  Status: Canceled Health Maintenance  6. UA With REFLEX; [Do Not Release]; Status:In Progress  - Specimen/Data Collected;    Done: [id number]  Discussion/Summary   Katara has been diagnosed with a 6 mm x 9 mm left proximal ureteral stone. Morgan [name] was concerned that the stone was not visible, but I can clearly see it on KUB in the area of the upper ureter, at least on the KUB at the end of December. More recent CT scan suggested that the stone has migrated very little. She has had this stone now for a while and is interested in having something done as soon as possible. Her pain is managed reasonably well with Dilaudid, but she does have pain on a continuous basis. There is no indication for emergent intervention, but we will try to accommodate her. The stone still could pass spontaneously, but it is becoming less likely. She has had several ureteroscopic procedures. She had ESWL x1 that did not work. Her stone is primarily calcium oxalate monohydrate. We did give her option of considering a repeat attempt at ESWL versus ureteroscopy, but she is interested in a more definitive ureteroscopic approach, which given again, her history and stone composition is quite reasonable. We will do everything we can to get her taken care of as soon as possible. Additional prescription for Dilaudid as well as some Zofran today.   Signatures Electronically signed by : Rana Snare, M.D.; Dec 22 2014 10:48AM EST

## 2014-12-26 NOTE — Anesthesia Preprocedure Evaluation (Signed)
Anesthesia Evaluation  Patient identified by MRN, date of birth, ID band Patient awake    Reviewed: Allergy & Precautions, H&P , NPO status , Patient's Chart, lab work & pertinent test results  History of Anesthesia Complications (+) PONV and history of anesthetic complications  Airway Mallampati: II  TM Distance: >3 FB Neck ROM: Full    Dental no notable dental hx.    Pulmonary neg pulmonary ROS,  breath sounds clear to auscultation  Pulmonary exam normal       Cardiovascular negative cardio ROS  Rhythm:Regular Rate:Normal     Neuro/Psych negative neurological ROS  negative psych ROS   GI/Hepatic negative GI ROS, Neg liver ROS,   Endo/Other  negative endocrine ROS  Renal/GU negative Renal ROS  negative genitourinary   Musculoskeletal negative musculoskeletal ROS (+)   Abdominal   Peds negative pediatric ROS (+)  Hematology negative hematology ROS (+)   Anesthesia Other Findings   Reproductive/Obstetrics negative OB ROS                             Anesthesia Physical  Anesthesia Plan  ASA: II  Anesthesia Plan: General   Post-op Pain Management:    Induction: Intravenous  Airway Management Planned: LMA  Additional Equipment:   Intra-op Plan:   Post-operative Plan: Extubation in OR  Informed Consent: I have reviewed the patients History and Physical, chart, labs and discussed the procedure including the risks, benefits and alternatives for the proposed anesthesia with the patient or authorized representative who has indicated his/her understanding and acceptance.   Dental advisory given  Plan Discussed with: CRNA  Anesthesia Plan Comments:         Anesthesia Quick Evaluation

## 2014-12-26 NOTE — Anesthesia Postprocedure Evaluation (Signed)
  Anesthesia Post-op Note  Patient: Morgan [name]  Procedure(s) Performed: Procedure(s) (LRB): CYSTOSCOPY WITH RETROGRADE PYELOGRAM, URETEROSCOPY AND STENT PLACEMENT (Left) HOLMIUM LASER APPLICATION (Left)  Patient Location: PACU  Anesthesia Type: General  Level of Consciousness: awake and alert   Airway and Oxygen Therapy: Patient Spontanous Breathing  Post-op Pain: mild  Post-op Assessment: Post-op Vital signs reviewed, Patient's Cardiovascular Status Stable, Respiratory Function Stable, Patent Airway and No signs of Nausea or vomiting  Last Vitals:  Filed Vitals:   12/26/14 1145  BP: 95/50  Pulse: 68  Temp:   Resp: 9    Post-op Vital Signs: stable   Complications: No apparent anesthesia complications

## 2014-12-26 NOTE — Op Note (Signed)
Preoperative diagnosis: left ureteral calculus  Postoperative diagnosis: left ureteral calculus  Procedure:  1. Cystoscopy 2. left ureteroscopy and stone removal 3. Ureteroscopic laser lithotripsy 4. left ureteral stent placement (35f) 24cm 5. left retrograde pyelography with interpretation  Surgeon: Bernestine Amass, MD  Anesthesia: General  Complications: None  Intraoperative findings: left retrograde pyelography demonstrated a filling defect within the left ureter consistent with the patient's known calculus without other abnormalities.  EBL: Minimal  Specimens: 1. left ureteral calculus  Disposition of specimens: [facility] for stone analysis  Indication: Morgan [name] is a 44 y.o.   patient with urolithiasis. After reviewing the management options for treatment, the patient elected to proceed with the above surgical procedure(s). We have discussed the potential benefits and risks of the procedure, side effects of the proposed treatment, the likelihood of the patient achieving the goals of the procedure, and any potential problems that might occur during the procedure or recuperation. Informed consent has been obtained.  Description of procedure:  The patient was taken to the operating room and general anesthesia was induced.  The patient was placed in the dorsal lithotomy position, prepped and draped in the usual sterile fashion, and preoperative antibiotics were administered. A preoperative time-out was performed.   Cystourethroscopy was performed.  The patient's urethra was examined and was normal. The bladder was then systematically examined in its entirety. There was no evidence for any bladder tumors, stones, or other mucosal pathology.    Attention then turned to the left ureteral orifice and a ureteral catheter was used to intubate the ureteral orifice.  Omnipaque contrast was injected through the ureteral catheter and a retrograde pyelogram was  performed with findings as dictated above.  A 0.38 sensor guidewire was then advanced up the left ureter into the renal pelvis under fluoroscopic guidance. The 6 Fr semirigid ureteroscope was then advanced into the ureter next to the guidewire and the calculus was identified.   The stone was then fragmented with the 365 micron holmium laser fiber on a setting of 0.8J and frequency of 5 Hz.   All stones were then removed from the ureter with a zero tip nitinol basket.  Reinspection of the ureter revealed no remaining visible stones or fragments.   The wire was then backloaded through the cystoscope and a ureteral stent was advance over the wire using Seldinger technique.  The stent was positioned appropriately under fluoroscopic and cystoscopic guidance.  The wire was then removed with an adequate stent curl noted in the renal pelvis as well as in the bladder.  The bladder was then emptied and the procedure ended.  The patient appeared to tolerate the procedure well and without complications.  The patient was able to be awakened and transferred to the recovery unit in satisfactory condition.

## 2014-12-26 NOTE — Transfer of Care (Signed)
Immediate Anesthesia Transfer of Care Note  Patient: Morgan [name]  Procedure(s) Performed: Procedure(s) (LRB): CYSTOSCOPY WITH RETROGRADE PYELOGRAM, URETEROSCOPY AND STENT PLACEMENT (Left) HOLMIUM LASER APPLICATION (Left)  Patient Location: PACU  Anesthesia Type: General  Level of Consciousness: awake, alert  and oriented  Airway & Oxygen Therapy: Patient Spontanous Breathing and Patient connected to face mask oxygen  Post-op Assessment: Report given to PACU RN and Post -op Vital signs reviewed and stable  Post vital signs: Reviewed and stable  Complications: No apparent anesthesia complications

## 2014-12-27 ENCOUNTER — Encounter (HOSPITAL_BASED_OUTPATIENT_CLINIC_OR_DEPARTMENT_OTHER): Payer: Self-pay | Admitting: Urology

## 2015-03-26 ENCOUNTER — Emergency Department (HOSPITAL_COMMUNITY)
Admission: EM | Admit: 2015-03-26 | Discharge: 2015-03-26 | Disposition: A | Discharge status: Home / Self Care | Payer: BC Managed Care – PPO | Attending: Emergency Medicine | Admitting: Emergency Medicine

## 2015-03-26 ENCOUNTER — Encounter (HOSPITAL_COMMUNITY): Payer: Self-pay | Admitting: Emergency Medicine

## 2015-03-26 ENCOUNTER — Emergency Department (HOSPITAL_COMMUNITY): Payer: BC Managed Care – PPO

## 2015-03-26 DIAGNOSIS — Z79899 Other long term (current) drug therapy: Secondary | ICD-10-CM | POA: Insufficient documentation

## 2015-03-26 DIAGNOSIS — R109 Unspecified abdominal pain: Secondary | ICD-10-CM | POA: Diagnosis present

## 2015-03-26 DIAGNOSIS — Z9884 Bariatric surgery status: Secondary | ICD-10-CM | POA: Insufficient documentation

## 2015-03-26 DIAGNOSIS — G43909 Migraine, unspecified, not intractable, without status migrainosus: Secondary | ICD-10-CM | POA: Insufficient documentation

## 2015-03-26 DIAGNOSIS — Z90710 Acquired absence of both cervix and uterus: Secondary | ICD-10-CM | POA: Diagnosis not present

## 2015-03-26 DIAGNOSIS — K219 Gastro-esophageal reflux disease without esophagitis: Secondary | ICD-10-CM | POA: Insufficient documentation

## 2015-03-26 DIAGNOSIS — Z9049 Acquired absence of other specified parts of digestive tract: Secondary | ICD-10-CM | POA: Insufficient documentation

## 2015-03-26 DIAGNOSIS — E039 Hypothyroidism, unspecified: Secondary | ICD-10-CM | POA: Insufficient documentation

## 2015-03-26 DIAGNOSIS — N200 Calculus of kidney: Secondary | ICD-10-CM

## 2015-03-26 DIAGNOSIS — Z8619 Personal history of other infectious and parasitic diseases: Secondary | ICD-10-CM | POA: Diagnosis not present

## 2015-03-26 LAB — URINALYSIS, ROUTINE W REFLEX MICROSCOPIC
BILIRUBIN URINE: NEGATIVE
Glucose, UA: 100 mg/dL — AB
Hgb urine dipstick: NEGATIVE
Ketones, ur: NEGATIVE mg/dL
LEUKOCYTES UA: NEGATIVE
NITRITE: NEGATIVE
Protein, ur: NEGATIVE mg/dL
Specific Gravity, Urine: 1.017 (ref 1.005–1.030)
Urobilinogen, UA: 0.2 mg/dL (ref 0.0–1.0)
pH: 6 (ref 5.0–8.0)

## 2015-03-26 MED ORDER — SODIUM CHLORIDE 0.9 % IV BOLUS (SEPSIS)
1000.0000 mL | Freq: Once | INTRAVENOUS | Status: AC
  Administered 2015-03-26: 1000 mL via INTRAVENOUS

## 2015-03-26 MED ORDER — ONDANSETRON HCL 4 MG/2ML IJ SOLN
4.0000 mg | Freq: Once | INTRAMUSCULAR | Status: AC
  Administered 2015-03-26: 4 mg via INTRAVENOUS
  Filled 2015-03-26: qty 2

## 2015-03-26 MED ORDER — HYDROMORPHONE HCL 1 MG/ML IJ SOLN
1.0000 mg | Freq: Once | INTRAMUSCULAR | Status: AC
  Administered 2015-03-26: 1 mg via INTRAVENOUS
  Filled 2015-03-26: qty 1

## 2015-03-26 MED ORDER — HYDROMORPHONE HCL 2 MG PO TABS: 2.0000 mg | ORAL_TABLET | Freq: Four times a day (QID) | ORAL | Status: DC | PRN

## 2015-03-26 MED ORDER — KETOROLAC TROMETHAMINE 30 MG/ML IJ SOLN
30.0000 mg | Freq: Once | INTRAMUSCULAR | Status: AC
  Administered 2015-03-26: 30 mg via INTRAVENOUS
  Filled 2015-03-26: qty 1

## 2015-03-26 NOTE — Discharge Instructions (Signed)
As discussed, your evaluation today has been largely reassuring.  But, it is important that you monitor your condition carefully, and do not hesitate to return to the ED if you develop new, or concerning changes in your condition.  Otherwise, please follow-up with your physician for appropriate ongoing care.   Kidney Stones Kidney stones (urolithiasis) are solid masses that form inside your kidneys. The intense pain is caused by the stone moving through the kidney, ureter, bladder, and urethra (urinary tract). When the stone moves, the ureter starts to spasm around the stone. The stone is usually passed in your pee (urine).  HOME CARE  Drink enough fluids to keep your pee clear or pale yellow. This helps to get the stone out.  Strain all pee through the provided strainer. Do not pee without peeing through the strainer, not even once. If you pee the stone out, catch it in the strainer. The stone may be as small as a grain of salt. Take this to your doctor. This will help your doctor figure out what you can do to try to prevent more kidney stones.  Only take medicine as told by your doctor.  Follow up with your doctor as told.  Get follow-up X-rays as told by your doctor. GET HELP IF: You have pain that gets worse even if you have been taking pain medicine. GET HELP RIGHT AWAY IF:   Your pain does not get better with medicine.  You have a fever or shaking chills.  Your pain increases and gets worse over 18 hours.  You have new belly (abdominal) pain.  You feel faint or pass out.  You are unable to pee. MAKE SURE YOU:   Understand these instructions.  Will watch your condition.  Will get help right away if you are not doing well or get worse. Document Released: 04/22/2008 Document Revised: 07/07/2013 Document Reviewed: 04/07/2013 [facility] Patient Information 2015 [place], [place]. This information is not intended to replace advice given to you by your health care provider. Make  sure you discuss any questions you have with your health care provider.

## 2015-03-26 NOTE — ED Notes (Signed)
Pt w/ hx of kidney stones c/o rt sided flank pain radiating to groin since midnight last night.  States that she took uribel, flomax, and tramadol with no relief.  Pt also feels as if she cannot urinate.  Urinated a small amount about an hour ago.

## 2015-03-26 NOTE — ED Provider Notes (Signed)
CSN: [id number]     Arrival date & time 03/26/15  0710 History   First MD Initiated Contact with Patient 03/26/15 [phone]     Chief Complaint  Patient presents with  . Flank Pain    HPI  Patient presents with concern of right-sided flank pain. The pain began about 6 hours ago. Since onset symptoms of been persistent, with no relief from the pain with Flomax, tramadol, uribel. Pain is sore, nonradiating, severe. Pain is similar to that she has experienced with prior kidney stone. No abdominal pain, vomiting, though she is nauseous. She acknowledges decreased urine output, though no dysuria, hematuria.   Past Medical History  Diagnosis Date  . Right ureteral stone   . RLS (restless legs syndrome)   . Acid reflux   . Hematuria   . Renal calculi BILATERAL  . History of kidney stones   . PONV (postoperative nausea and vomiting)   . Hypothyroidism   . H. pylori infection     hx of , none now  . Family history of adverse reaction to anesthesia     sister has PONV  . Headache     migraines, uses imitrex as needed   Past Surgical History  Procedure Laterality Date  . Laparoscopic assisted vaginal hysterectomy  MARCH 2014    W/ BILATERAL SALPINGOOPHORECTOMY  . Cholecystectomy    . Laparoscopic gastric banding  SEPT 2011  . Tonsillectomy and adenoidectomy  AGE 60'S  . Ureterolithotomy  NOV 2013  . Cystoscopy/retrograde/ureteroscopy Right 03/31/2013    Procedure: CYSTOSCOPY/RETROGRADE/URETEROSCOPY;  Surgeon: Bernestine Amass, MD;  Location: [facility];  Service: Urology;  Laterality: Right;  . Holmium laser application Right 0/62/3762    Procedure: HOLMIUM LASER APPLICATION;  Surgeon: Bernestine Amass, MD;  Location: [facility];  Service: Urology;  Laterality: Right;  . Gastric bypass  03/15/14  . Abdominal hysterectomy    . Cystoscopy with retrograde pyelogram, ureteroscopy and stent placement Left 12/26/2014    Procedure: CYSTOSCOPY WITH RETROGRADE  PYELOGRAM, URETEROSCOPY AND STENT PLACEMENT;  Surgeon: Bernestine Amass, MD;  Location: [facility];  Service: Urology;  Laterality: Left;  . Holmium laser application Left 06/20/1516    Procedure: HOLMIUM LASER APPLICATION;  Surgeon: Bernestine Amass, MD;  Location: [facility];  Service: Urology;  Laterality: Left;   No family history on file. History  Substance Use Topics  . Smoking status: Never Smoker   . Smokeless tobacco: Never Used  . Alcohol Use: No   OB History    No data available     Review of Systems  Constitutional:       Per HPI, otherwise negative  HENT:       Per HPI, otherwise negative  Respiratory:       Per HPI, otherwise negative  Cardiovascular:       Per HPI, otherwise negative  Gastrointestinal: Positive for nausea. Negative for vomiting.  Endocrine:       Negative aside from HPI  Genitourinary:       Neg aside from HPI   Musculoskeletal:       Per HPI, otherwise negative  Allergic/Immunologic: Negative for immunocompromised state.  Neurological: Negative for syncope.      Allergies  Clindamycin/lincomycin; Nsaids; and Percocet  Home Medications   Prior to Admission medications   Medication Sig Start Date End Date Taking? Authorizing Provider  Cyanocobalamin (VITAMIN B-12) 1000 MCG SUBL Place 1,000 mcg under the tongue daily.  Yes Historical Provider, MD  cyclobenzaprine (FLEXERIL) 10 MG tablet Take 10 mg by mouth 3 (three) times daily as needed for muscle spasms.   Yes Historical Provider, MD  gabapentin (NEURONTIN) 300 MG capsule Take 300 mg by mouth 3 (three) times daily.  09/06/14  Yes Historical Provider, MD  lansoprazole (PREVACID) 30 MG capsule Take 30 mg by mouth 2 (two) times daily before a meal.   Yes Historical Provider, MD  levothyroxine (LEVOTHROID) 88 MCG tablet Take 88 mcg by mouth daily before breakfast.   Yes Historical Provider, MD  Meth-Hyo-M Bl-Na Phos-Ph Sal (URIBEL) 118 MG CAPS Take 1 capsule (118  mg total) by mouth 3 (three) times daily as needed. 12/26/14  Yes Rana Snare, MD  Multiple Vitamin (MULTIVITAMIN WITH MINERALS) TABS tablet Take 1 tablet by mouth daily.   Yes Historical Provider, MD  polyethylene glycol (MIRALAX / GLYCOLAX) packet Take 17 g by mouth daily as needed for moderate constipation.   Yes Historical Provider, MD  sertraline (ZOLOFT) 100 MG tablet Take 100 mg by mouth at bedtime.   Yes Historical Provider, MD  SUMAtriptan (IMITREX) 100 MG tablet Take 100 mg by mouth every 2 (two) hours as needed for migraine or headache. May repeat in 2 hours if headache persists or recurs.   Yes Historical Provider, MD  tamsulosin (FLOMAX) 0.4 MG CAPS capsule Take 0.4 mg by mouth daily after supper.  11/09/14  Yes Historical Provider, MD  traMADol (ULTRAM) 50 MG tablet Take 50 mg by mouth every 6 (six) hours as needed for moderate pain (kidney stones).   Yes Historical Provider, MD  traZODone (DESYREL) 50 MG tablet Take 50 mg by mouth at bedtime.   Yes Historical Provider, MD  HYDROmorphone (DILAUDID) 2 MG tablet Take 1-2 tablets (2-4 mg total) by mouth every 6 (six) hours as needed for severe pain (pain). 03/26/15   Carmin Muskrat, MD   BP 116/72 mmHg  Pulse 94  Temp(Src) 98.9 F (37.2 C) (Oral)  Resp 16  SpO2 100% Physical Exam  Constitutional: She is oriented to person, place, and time. She appears well-developed and well-nourished. No distress.  HENT:  Head: Normocephalic and atraumatic.  Eyes: Conjunctivae and EOM are normal.  Cardiovascular: Normal rate and regular rhythm.   Pulmonary/Chest: Effort normal and breath sounds normal. No stridor. No respiratory distress.  Abdominal: She exhibits no distension.  Musculoskeletal: She exhibits no edema.       Arms: Neurological: She is alert and oriented to person, place, and time. No cranial nerve deficit.  Skin: Skin is warm and dry.  Psychiatric: She has a normal mood and affect.  Nursing note and vitals reviewed.   ED  Course  Procedures (including critical care time) Labs Review Labs Reviewed  URINALYSIS, ROUTINE W REFLEX MICROSCOPIC - Abnormal; Notable for the following:    Color, Urine GREEN (*)    APPearance CLOUDY (*)    Glucose, UA 100 (*)    All other components within normal limits    Imaging Review US Renal  03/26/2015   CLINICAL DATA:  Right flank pain since 12 a.m.  EXAM: RENAL / URINARY TRACT ULTRASOUND COMPLETE  COMPARISON:  CT, 12/20/2014  FINDINGS: Right Kidney:  Length: 10.5 cm. Small echogenic foci in a lower pole consistent with a nonobstructing intrarenal stone. No masses. No hydronephrosis.  Left Kidney:  Length: 11.3 cm. Echogenicity within normal limits. No mass or hydronephrosis visualized.  Bladder:  Appears normal for degree of bladder distention.  Incidental note is  made of splenomegaly, with a splenic volume of 700 mL  IMPRESSION: 1. No acute findings.  No hydronephrosis. 2. Probable nonobstructing intrarenal stone in the lower pole of the right kidney. 3. Splenomegaly.   Electronically Signed   By: Lajean Manes M.D.   On: 03/26/2015 10:04   I reviewed the results (including imaging as performed), agree with the interpretation  On repeat exam the patient appears better.  We reviewed all findings.  MDM   Final diagnoses:  Nephrolithiasis   Patient with a history kidney stones presents with new right flank pain. Here, no evidence a creamy, sepsis, obstruction or infection. There is evidence for nephrolithiasis. Patient had pain reduction with analgesics here, was discharged in stable condition.    Carmin Muskrat, MD 03/26/15 [phone]

## 2015-03-26 NOTE — ED Notes (Signed)
Patient presents to the ED from home where she lives with her husband with complaints of right flank pain for 1 day.  Patient states she took Toradol and Dilaudid (2 mg) at home with no relief.  Patient states she began to have the sensation that she could not void completely Friday, 5/6, but the right flank pain did not begin in earnest until last night around midnight.  Patient states she spent most of yesterday doing yard work.  Patient denies SOB, dizziness, chest pain or abdominal pain.    On exam, patients lung sounds are clear in all lobes with no wheezing or crackles.  Heart sounds WNL, S1/S2, no rub, gallop or murmur.  +3 radial and pedal pulses.  No pre-tibial and pedal edema.  Patient's abdomen is soft and non-tender to palpation.  Bowel sounds hypoctive.  CVA pain on palpation.  Skin warm and dry.  Patient alert and oriented to person, place, time and events.

## 2015-04-12 DIAGNOSIS — S069X9A Unspecified intracranial injury with loss of consciousness of unspecified duration, initial encounter: Secondary | ICD-10-CM

## 2015-04-12 DIAGNOSIS — S069XAA Unspecified intracranial injury with loss of consciousness status unknown, initial encounter: Secondary | ICD-10-CM

## 2015-04-12 HISTORY — DX: Unspecified intracranial injury with loss of consciousness of unspecified duration, initial encounter: S06.9X9A

## 2015-04-12 HISTORY — DX: Unspecified intracranial injury with loss of consciousness status unknown, initial encounter: S06.9XAA

## 2016-02-16 IMAGING — US US RENAL
1 series · 14 of 25 positions shown · non-contrast
Comparison: CT, 12/20/2014

CLINICAL DATA: Right flank pain since 12 a.m..

EXAM:
RENAL / URINARY TRACT ULTRASOUND COMPLETE

[Series 1: us renal · 0.24mm/px · 43 acquisitions, 14 frames shown]
[im 1/43]
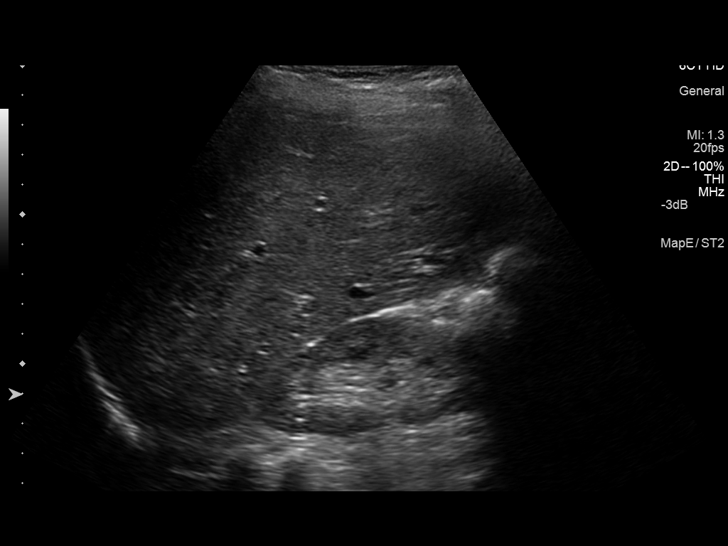
[im 4/43]
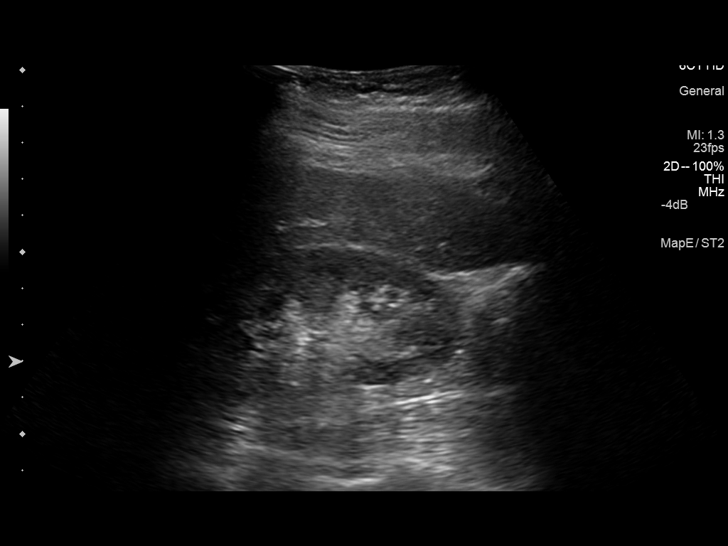
[im 8/43]
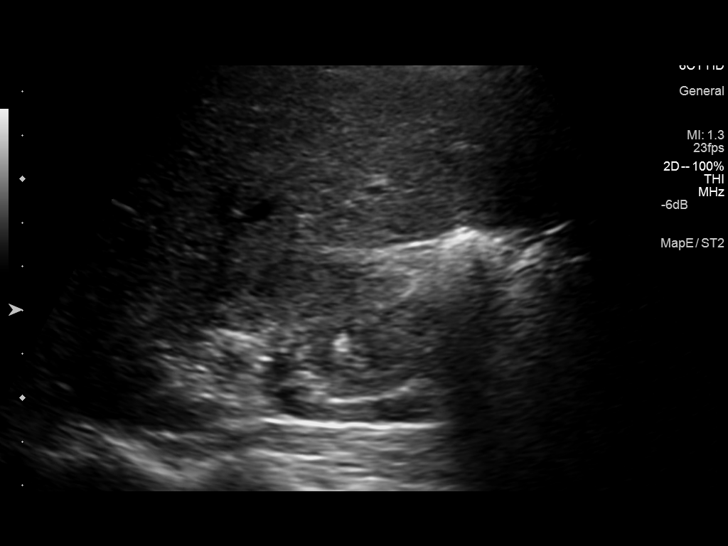
[im 11/43]
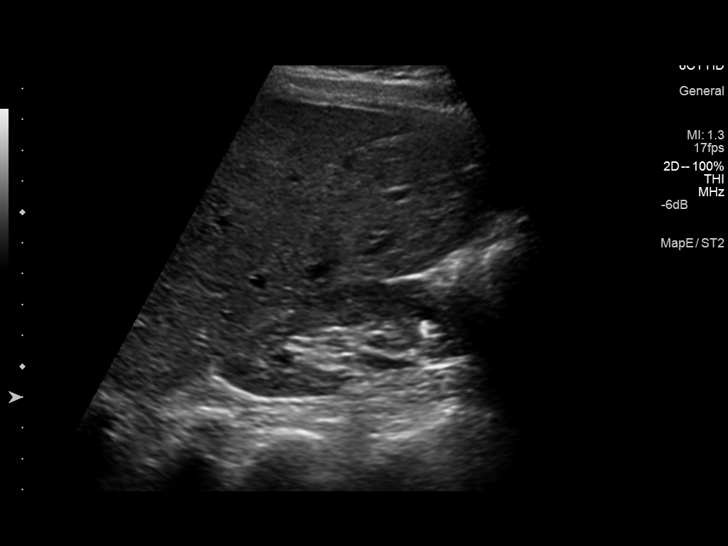
[im 15/43]
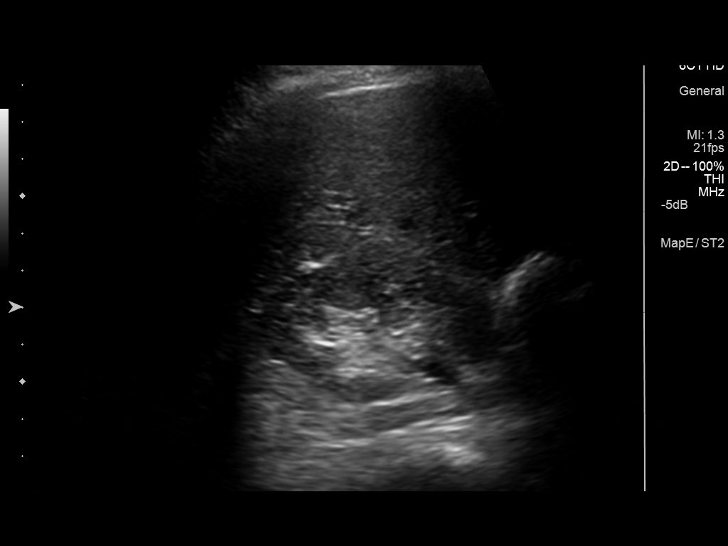
[im 16/43]
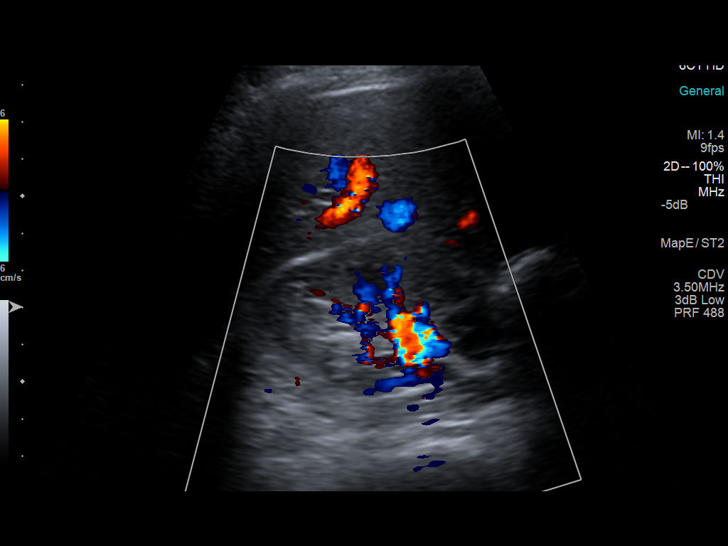
[im 20/43]
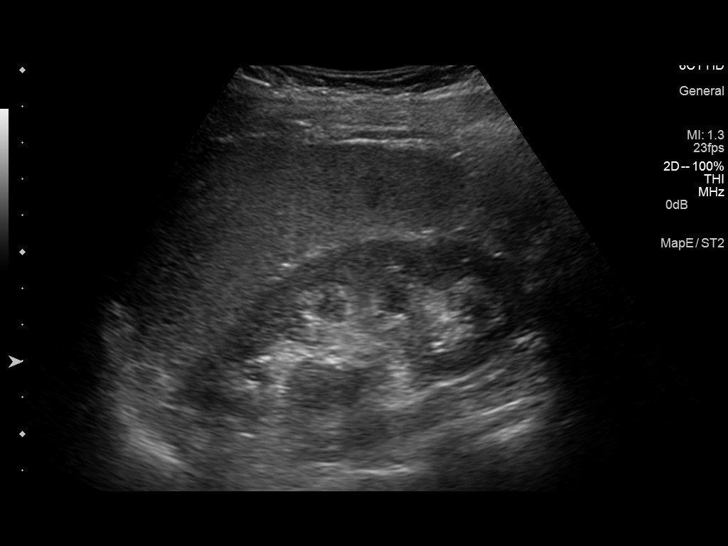
[im 23/43]
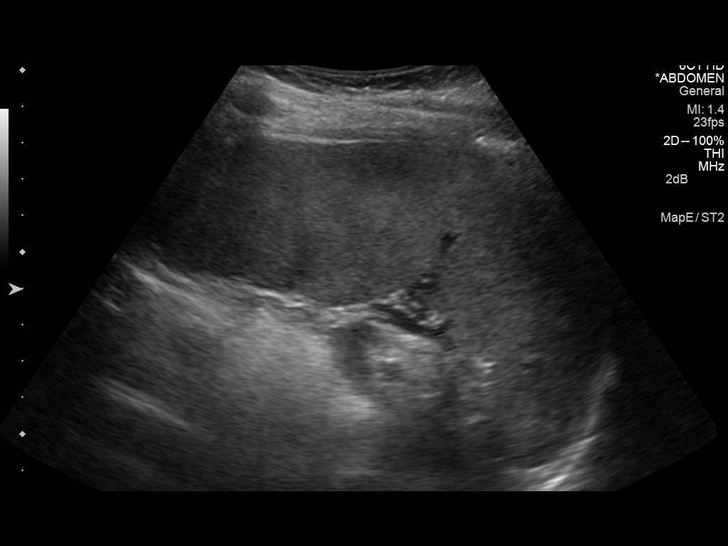
[im 27/43]
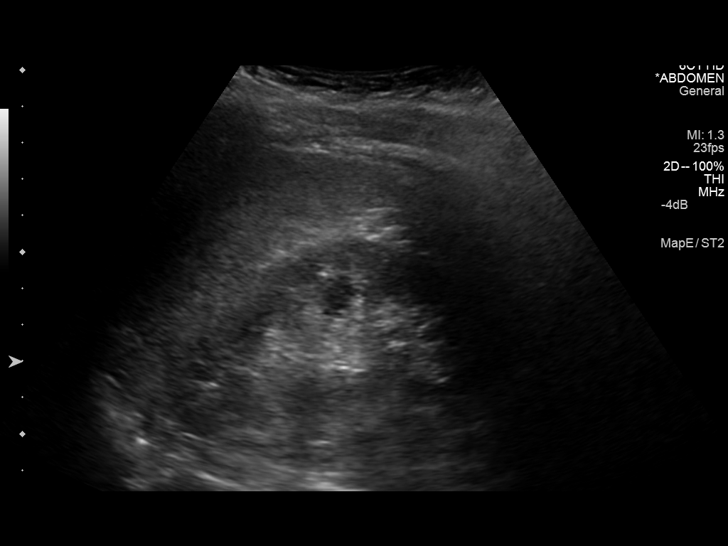
[im 29/43]
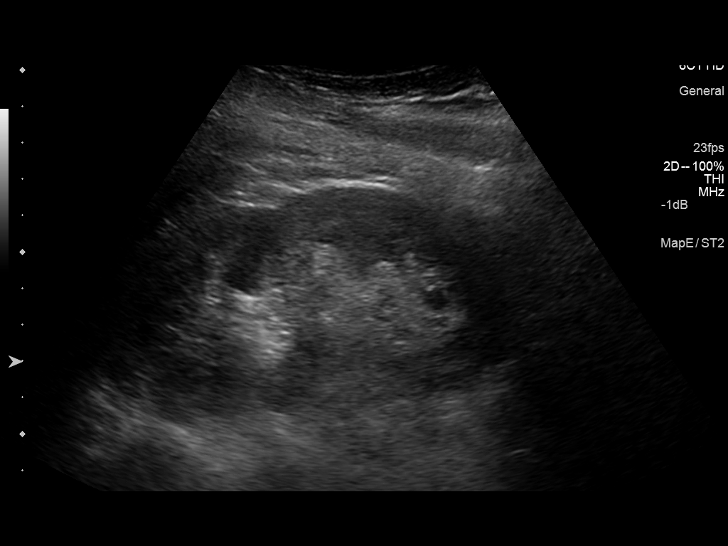
[im 32/43]
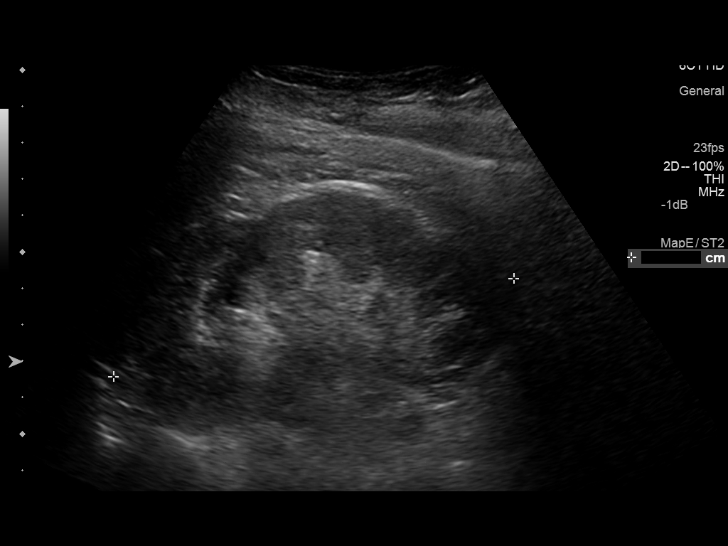
[im 36/43]
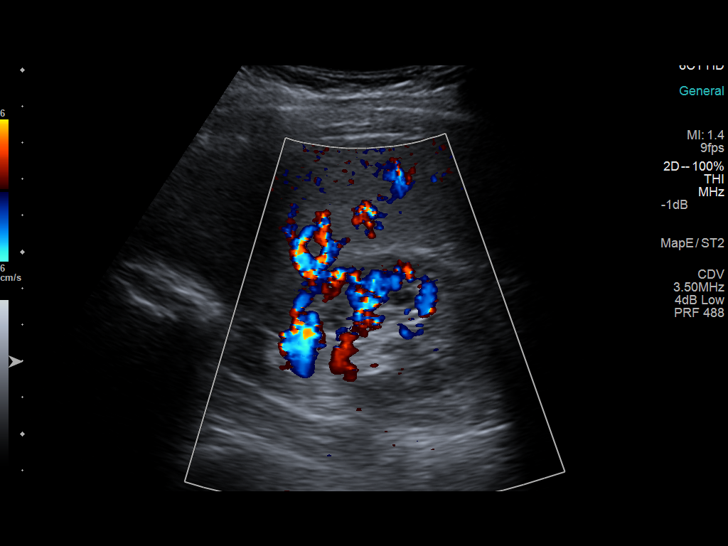
[im 39/43]
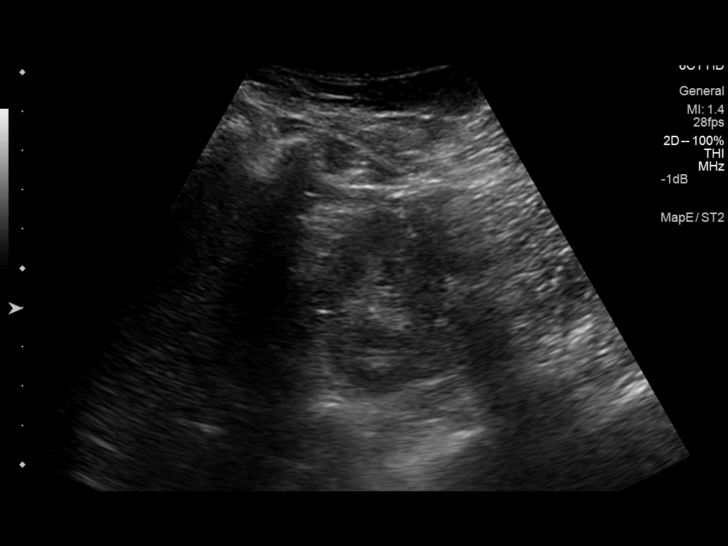
[im 43/43]
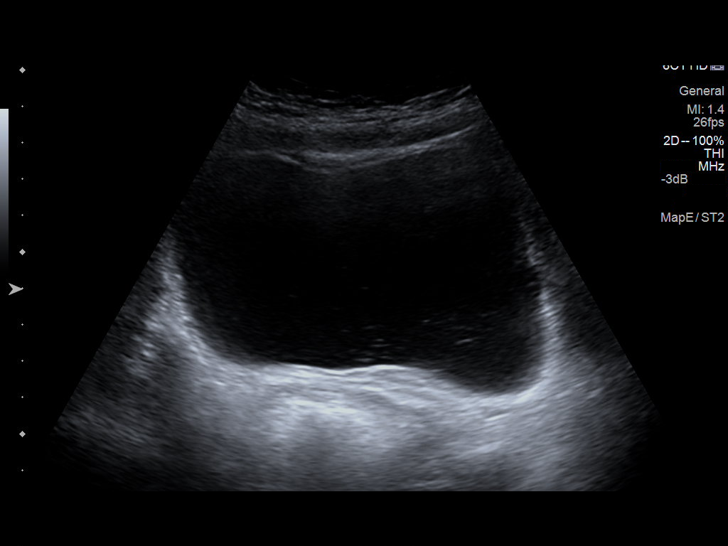

[14 of 25 positions shown; findings below may reference images not displayed]

FINDINGS: Right Kidney:

Length: 10.5 cm. Small echogenic foci in a lower pole consistent
with a nonobstructing intrarenal stone. No masses. No
hydronephrosis.

Left Kidney:

Length: 11.3 cm. Echogenicity within normal limits. No mass or
hydronephrosis visualized.

Bladder:

Appears normal for degree of bladder distention.

Incidental note is made of splenomegaly, with a splenic volume of
700 mL
IMPRESSION: 1. No acute findings.  No hydronephrosis.
2. Probable nonobstructing intrarenal stone in the lower pole of the
right kidney.
3. Splenomegaly.

## 2016-03-12 ENCOUNTER — Emergency Department (HOSPITAL_COMMUNITY)
Admission: EM | Admit: 2016-03-12 | Discharge: 2016-03-13 | Disposition: A | Discharge status: Other Acute Inpatient Hospital | Payer: Worker's Compensation | Attending: Emergency Medicine | Admitting: Emergency Medicine

## 2016-03-12 ENCOUNTER — Encounter (HOSPITAL_COMMUNITY): Payer: Self-pay | Admitting: *Deleted

## 2016-03-12 DIAGNOSIS — Z87442 Personal history of urinary calculi: Secondary | ICD-10-CM | POA: Insufficient documentation

## 2016-03-12 DIAGNOSIS — R45851 Suicidal ideations: Secondary | ICD-10-CM

## 2016-03-12 DIAGNOSIS — F332 Major depressive disorder, recurrent severe without psychotic features: Secondary | ICD-10-CM | POA: Diagnosis not present

## 2016-03-12 DIAGNOSIS — F419 Anxiety disorder, unspecified: Secondary | ICD-10-CM | POA: Diagnosis not present

## 2016-03-12 DIAGNOSIS — F1310 Sedative, hypnotic or anxiolytic abuse, uncomplicated: Secondary | ICD-10-CM | POA: Diagnosis not present

## 2016-03-12 DIAGNOSIS — F22 Delusional disorders: Secondary | ICD-10-CM | POA: Diagnosis not present

## 2016-03-12 DIAGNOSIS — Z8619 Personal history of other infectious and parasitic diseases: Secondary | ICD-10-CM | POA: Insufficient documentation

## 2016-03-12 DIAGNOSIS — Z9884 Bariatric surgery status: Secondary | ICD-10-CM | POA: Diagnosis not present

## 2016-03-12 DIAGNOSIS — F32A Depression, unspecified: Secondary | ICD-10-CM

## 2016-03-12 DIAGNOSIS — G2581 Restless legs syndrome: Secondary | ICD-10-CM | POA: Insufficient documentation

## 2016-03-12 DIAGNOSIS — F329 Major depressive disorder, single episode, unspecified: Secondary | ICD-10-CM | POA: Insufficient documentation

## 2016-03-12 DIAGNOSIS — F4310 Post-traumatic stress disorder, unspecified: Secondary | ICD-10-CM | POA: Diagnosis not present

## 2016-03-12 DIAGNOSIS — R51 Headache: Secondary | ICD-10-CM | POA: Diagnosis not present

## 2016-03-12 DIAGNOSIS — Z79899 Other long term (current) drug therapy: Secondary | ICD-10-CM | POA: Insufficient documentation

## 2016-03-12 DIAGNOSIS — Z8719 Personal history of other diseases of the digestive system: Secondary | ICD-10-CM | POA: Diagnosis not present

## 2016-03-12 DIAGNOSIS — E039 Hypothyroidism, unspecified: Secondary | ICD-10-CM | POA: Diagnosis not present

## 2016-03-12 LAB — RAPID URINE DRUG SCREEN, HOSP PERFORMED
Amphetamines: NOT DETECTED
Barbiturates: NOT DETECTED
Benzodiazepines: POSITIVE — AB
Cocaine: NOT DETECTED
OPIATES: NOT DETECTED
Tetrahydrocannabinol: NOT DETECTED
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
## 2016-03-12 NOTE — ED Notes (Signed)
Pt husband is taking her belongings

## 2016-03-13 ENCOUNTER — Inpatient Hospital Stay (HOSPITAL_COMMUNITY)
Admission: AD | Admit: 2016-03-13 | Discharge: 2016-03-17 | DRG: 882 | Disposition: A | Discharge status: Home / Self Care | Payer: BC Managed Care – PPO | Attending: Psychiatry | Admitting: Psychiatry

## 2016-03-13 ENCOUNTER — Encounter (HOSPITAL_COMMUNITY): Payer: Self-pay

## 2016-03-13 DIAGNOSIS — F32A Depression, unspecified: Secondary | ICD-10-CM | POA: Insufficient documentation

## 2016-03-13 DIAGNOSIS — Z9884 Bariatric surgery status: Secondary | ICD-10-CM

## 2016-03-13 DIAGNOSIS — Z79899 Other long term (current) drug therapy: Secondary | ICD-10-CM

## 2016-03-13 DIAGNOSIS — G4700 Insomnia, unspecified: Secondary | ICD-10-CM | POA: Diagnosis present

## 2016-03-13 DIAGNOSIS — F332 Major depressive disorder, recurrent severe without psychotic features: Secondary | ICD-10-CM | POA: Diagnosis present

## 2016-03-13 DIAGNOSIS — F329 Major depressive disorder, single episode, unspecified: Secondary | ICD-10-CM | POA: Diagnosis not present

## 2016-03-13 DIAGNOSIS — R45851 Suicidal ideations: Secondary | ICD-10-CM | POA: Diagnosis present

## 2016-03-13 DIAGNOSIS — F4310 Post-traumatic stress disorder, unspecified: Secondary | ICD-10-CM | POA: Diagnosis present

## 2016-03-13 DIAGNOSIS — F4312 Post-traumatic stress disorder, chronic: Principal | ICD-10-CM | POA: Diagnosis present

## 2016-03-13 LAB — COMPREHENSIVE METABOLIC PANEL
ALBUMIN: 4.2 g/dL (ref 3.5–5.0)
ALK PHOS: 87 U/L (ref 38–126)
ALT: 40 U/L (ref 14–54)
ANION GAP: 5 (ref 5–15)
AST: 38 U/L (ref 15–41)
BUN: 11 mg/dL (ref 6–20)
CALCIUM: 9.5 mg/dL (ref 8.9–10.3)
CHLORIDE: 109 mmol/L (ref 101–111)
CO2: 27 mmol/L (ref 22–32)
CREATININE: 0.98 mg/dL (ref 0.44–1.00)
GFR calc Af Amer: >60 mL/min (ref >60)
GFR calc non Af Amer: >60 mL/min (ref >60)
GLUCOSE: 107 mg/dL — AB (ref 65–99)
Potassium: 4.1 mmol/L (ref 3.5–5.1)
SODIUM: 141 mmol/L (ref 135–145)
Total Bilirubin: 0.2 mg/dL — ABNORMAL LOW (ref 0.3–1.2)
Total Protein: 6.9 g/dL (ref 6.5–8.1)

## 2016-03-13 LAB — CBC
HCT: 39.8 % (ref 36.0–46.0)
Hemoglobin: 13.2 g/dL (ref 12.0–15.0)
MCH: 27.6 pg (ref 26.0–34.0)
MCHC: 33.2 g/dL (ref 30.0–36.0)
MCV: 83.1 fL (ref 78.0–100.0)
PLATELETS: 185 10*3/uL (ref 150–400)
RBC: 4.79 MIL/uL (ref 3.87–5.11)
RDW: 13.7 % (ref 11.5–15.5)
WBC: 5.4 10*3/uL (ref 4.0–10.5)

## 2016-03-13 LAB — ETHANOL: Alcohol, Ethyl (B): <5 mg/dL (ref <5)

## 2016-03-13 LAB — ACETAMINOPHEN LEVEL

## 2016-03-13 LAB — SALICYLATE LEVEL: Salicylate Lvl: <4 mg/dL (ref 2.8–30.0)

## 2016-03-13 MED ORDER — TOPIRAMATE 100 MG PO TABS
100.0000 mg | ORAL_TABLET | Freq: Every day | ORAL | Status: DC
  Administered 2016-03-14: 100 mg via ORAL
  Filled 2016-03-13 (×3): qty 1

## 2016-03-13 MED ORDER — CYCLOBENZAPRINE HCL 10 MG PO TABS: 10.0000 mg | ORAL_TABLET | Freq: Three times a day (TID) | ORAL | Status: DC | PRN

## 2016-03-13 MED ORDER — PRAZOSIN HCL 1 MG PO CAPS
1.0000 mg | ORAL_CAPSULE | Freq: Every day | ORAL | Status: DC
  Administered 2016-03-14 – 2016-03-16 (×3): 1 mg via ORAL
  Filled 2016-03-13 (×7): qty 1

## 2016-03-13 MED ORDER — DIAZEPAM 5 MG PO TABS: 5.0000 mg | ORAL_TABLET | Freq: Every day | ORAL | Status: DC

## 2016-03-13 MED ORDER — TOPIRAMATE 100 MG PO TABS
100.0000 mg | ORAL_TABLET | Freq: Every day | ORAL | Status: DC
  Administered 2016-03-13: 100 mg via ORAL
  Filled 2016-03-13: qty 1

## 2016-03-13 MED ORDER — ALUM & MAG HYDROXIDE-SIMETH 200-200-20 MG/5ML PO SUSP: 30.0000 mL | ORAL | Status: DC | PRN

## 2016-03-13 MED ORDER — DIAZEPAM 5 MG PO TABS
5.0000 mg | ORAL_TABLET | Freq: Every day | ORAL | Status: DC
Start: 2016-03-13 — End: 2016-03-13
  Administered 2016-03-13: 5 mg via ORAL
  Filled 2016-03-13: qty 1

## 2016-03-13 MED ORDER — ONDANSETRON HCL 4 MG PO TABS
4.0000 mg | ORAL_TABLET | Freq: Three times a day (TID) | ORAL | Status: DC | PRN
  Administered 2016-03-16: 4 mg via ORAL
  Filled 2016-03-13: qty 1

## 2016-03-13 MED ORDER — PRAZOSIN HCL 1 MG PO CAPS
1.0000 mg | ORAL_CAPSULE | Freq: Every day | ORAL | Status: DC
  Filled 2016-03-13: qty 1

## 2016-03-13 MED ORDER — ENSURE ENLIVE PO LIQD
237.0000 mL | Freq: Two times a day (BID) | ORAL | Status: DC
  Administered 2016-03-14 – 2016-03-17 (×6): 237 mL via ORAL

## 2016-03-13 MED ORDER — GABAPENTIN 300 MG PO CAPS
300.0000 mg | ORAL_CAPSULE | Freq: Three times a day (TID) | ORAL | Status: DC
  Administered 2016-03-13 – 2016-03-17 (×12): 300 mg via ORAL
  Filled 2016-03-13 (×18): qty 1

## 2016-03-13 MED ORDER — TRAZODONE HCL 100 MG PO TABS
100.0000 mg | ORAL_TABLET | Freq: Every day | ORAL | Status: DC
  Administered 2016-03-13: 100 mg via ORAL
  Filled 2016-03-13: qty 1

## 2016-03-13 MED ORDER — ACETAMINOPHEN 325 MG PO TABS
650.0000 mg | ORAL_TABLET | Freq: Four times a day (QID) | ORAL | Status: DC | PRN
  Administered 2016-03-13: 650 mg via ORAL
  Filled 2016-03-13: qty 2

## 2016-03-13 MED ORDER — MONTELUKAST SODIUM 10 MG PO TABS
10.0000 mg | ORAL_TABLET | Freq: Every day | ORAL | Status: DC
  Administered 2016-03-14 – 2016-03-17 (×4): 10 mg via ORAL
  Filled 2016-03-13 (×6): qty 1

## 2016-03-13 MED ORDER — MONTELUKAST SODIUM 10 MG PO TABS
10.0000 mg | ORAL_TABLET | Freq: Every day | ORAL | Status: DC
  Administered 2016-03-13: 10 mg via ORAL
  Filled 2016-03-13: qty 1

## 2016-03-13 MED ORDER — TOPIRAMATE 25 MG PO TABS
50.0000 mg | ORAL_TABLET | Freq: Every day | ORAL | Status: DC
  Administered 2016-03-13: 50 mg via ORAL
  Filled 2016-03-13: qty 2

## 2016-03-13 MED ORDER — GABAPENTIN 300 MG PO CAPS
300.0000 mg | ORAL_CAPSULE | Freq: Three times a day (TID) | ORAL | Status: DC
  Administered 2016-03-13: 300 mg via ORAL
  Filled 2016-03-13: qty 1

## 2016-03-13 MED ORDER — ADULT MULTIVITAMIN W/MINERALS CH
1.0000 | ORAL_TABLET | Freq: Every day | ORAL | Status: DC
  Administered 2016-03-13: 1 via ORAL
  Filled 2016-03-13: qty 1

## 2016-03-13 MED ORDER — ONDANSETRON HCL 4 MG PO TABS: 4.0000 mg | ORAL_TABLET | Freq: Three times a day (TID) | ORAL | Status: DC | PRN

## 2016-03-13 MED ORDER — TRAZODONE HCL 50 MG PO TABS: 50.0000 mg | ORAL_TABLET | Freq: Every day | ORAL | Status: DC

## 2016-03-13 MED ORDER — ACETAMINOPHEN 325 MG PO TABS
650.0000 mg | ORAL_TABLET | Freq: Four times a day (QID) | ORAL | Status: DC | PRN
  Administered 2016-03-16: 650 mg via ORAL
  Filled 2016-03-13: qty 2

## 2016-03-13 MED ORDER — LEVOTHYROXINE SODIUM 88 MCG PO TABS
88.0000 ug | ORAL_TABLET | Freq: Every day | ORAL | Status: DC
  Administered 2016-03-14 – 2016-03-17 (×4): 88 ug via ORAL
  Filled 2016-03-13 (×8): qty 1

## 2016-03-13 MED ORDER — SERTRALINE HCL 50 MG PO TABS
200.0000 mg | ORAL_TABLET | Freq: Every day | ORAL | Status: DC
  Administered 2016-03-13: 200 mg via ORAL
  Filled 2016-03-13: qty 4

## 2016-03-13 MED ORDER — ACETAMINOPHEN 325 MG PO TABS
650.0000 mg | ORAL_TABLET | Freq: Four times a day (QID) | ORAL | Status: DC | PRN
Start: 2016-03-13 — End: 2016-03-14
  Administered 2016-03-13 – 2016-03-14 (×2): 650 mg via ORAL
  Filled 2016-03-13 (×2): qty 2

## 2016-03-13 MED ORDER — TRAZODONE HCL 50 MG PO TABS
50.0000 mg | ORAL_TABLET | Freq: Every day | ORAL | Status: DC
  Administered 2016-03-13: 50 mg via ORAL
  Filled 2016-03-13 (×4): qty 1

## 2016-03-13 MED ORDER — LEVOTHYROXINE SODIUM 88 MCG PO TABS
88.0000 ug | ORAL_TABLET | Freq: Every day | ORAL | Status: DC
  Administered 2016-03-13: 88 ug via ORAL
  Filled 2016-03-13 (×2): qty 1

## 2016-03-13 MED ORDER — ADULT MULTIVITAMIN W/MINERALS CH
1.0000 | ORAL_TABLET | Freq: Every day | ORAL | Status: DC
  Administered 2016-03-14 – 2016-03-17 (×4): 1 via ORAL
  Filled 2016-03-13 (×6): qty 1

## 2016-03-13 MED ORDER — MAGNESIUM HYDROXIDE 400 MG/5ML PO SUSP: 30.0000 mL | Freq: Every day | ORAL | Status: DC | PRN

## 2016-03-13 MED ORDER — DIAZEPAM 5 MG PO TABS: 5.0000 mg | ORAL_TABLET | Freq: Every day | ORAL | Status: DC | PRN

## 2016-03-13 MED ORDER — SERTRALINE HCL 100 MG PO TABS
200.0000 mg | ORAL_TABLET | Freq: Every day | ORAL | Status: DC
  Administered 2016-03-13 – 2016-03-16 (×4): 200 mg via ORAL
  Filled 2016-03-13 (×8): qty 2

## 2016-03-13 MED ORDER — TOPIRAMATE 25 MG PO TABS
50.0000 mg | ORAL_TABLET | Freq: Every day | ORAL | Status: DC
Start: 2016-03-13 — End: 2016-03-14
  Administered 2016-03-13: 50 mg via ORAL
  Filled 2016-03-13 (×4): qty 2

## 2016-03-13 NOTE — ED Notes (Signed)
Pt transported to [facility] by [facility]. No distress noted.

## 2016-03-13 NOTE — Progress Notes (Signed)
This is a 45 yr old female who came to [facility] with complain of worsening depression and suicidal ideation. Pt stated she sustained head injury  One year ago, she has been out of work since then. 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 Pt had crying spell during admission, but was cooperative. Pt's alert and oriented x 4, denied SI/HI and verbally agreed to seek staff for help. Consents signed, skin/belongings search completed and pt oriented to unit. Pt stable at this time. Pt given the opportunity to express concerns and ask questions. Pt given toiletries. Will continue to monitor.

## 2016-03-13 NOTE — BHH Counselor (Signed)
Patient accepted to [facility]. Patient must come to [facility] after 5pm. Room assignment is 405-2. Support paperwork has been completed. Nursing report [phone]. Patient is voluntary and [name] will provide transportation to [facility].
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
## 2016-03-13 NOTE — Tx Team (Signed)
Initial Interdisciplinary Treatment Plan   PATIENT STRESSORS: Financial difficulties Health problems Traumatic event   PATIENT STRENGTHS: Ability for insight Communication skills Motivation for treatment/growth Supportive family/friends   PROBLEM LIST: Problem List/Patient Goals Date to be addressed Date deferred Reason deferred Estimated date of resolution  Anxiety 03/13/16     Depression 03/13/16     "Get better." 03/13/16     "Be a better person." 03/13/16                                    DISCHARGE CRITERIA:  Ability to meet basic life and health needs Improved stabilization in mood, thinking, and/or behavior Medical problems require only outpatient monitoring Motivation to continue treatment in a less acute level of care  PRELIMINARY DISCHARGE PLAN: Attend aftercare/continuing care group Attend PHP/IOP Attend 12-step recovery group Outpatient therapy  PATIENT/FAMIILY INVOLVEMENT: This treatment plan has been presented to and reviewed with the patient, Morgan [name], and/or family member.  The patient and family have been given the opportunity to ask questions and make suggestions.  Wolfgang Phoenix 03/13/2016, 6:07 PM
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
## 2016-03-14 ENCOUNTER — Encounter (HOSPITAL_COMMUNITY): Payer: Self-pay | Admitting: Psychiatry

## 2016-03-14 DIAGNOSIS — F4312 Post-traumatic stress disorder, chronic: Principal | ICD-10-CM

## 2016-03-14 DIAGNOSIS — F332 Major depressive disorder, recurrent severe without psychotic features: Secondary | ICD-10-CM

## 2016-03-14 MED ORDER — CHLORDIAZEPOXIDE HCL 25 MG PO CAPS: 25.0000 mg | ORAL_CAPSULE | Freq: Four times a day (QID) | ORAL | Status: DC | PRN

## 2016-03-14 MED ORDER — TOPIRAMATE 100 MG PO TABS
100.0000 mg | ORAL_TABLET | Freq: Two times a day (BID) | ORAL | Status: DC
Start: 2016-03-15 — End: 2016-03-14
  Filled 2016-03-14: qty 1

## 2016-03-14 MED ORDER — TRAZODONE HCL 100 MG PO TABS
100.0000 mg | ORAL_TABLET | Freq: Every day | ORAL | Status: DC
  Administered 2016-03-14 – 2016-03-16 (×3): 100 mg via ORAL
  Filled 2016-03-14 (×6): qty 1

## 2016-03-14 MED ORDER — CYCLOBENZAPRINE HCL 10 MG PO TABS
10.0000 mg | ORAL_TABLET | Freq: Three times a day (TID) | ORAL | Status: DC
  Administered 2016-03-14 – 2016-03-17 (×9): 10 mg via ORAL
  Filled 2016-03-14 (×16): qty 1

## 2016-03-14 MED ORDER — TOPIRAMATE 25 MG PO TABS
25.0000 mg | ORAL_TABLET | Freq: Every day | ORAL | Status: DC
  Administered 2016-03-14: 25 mg via ORAL
  Filled 2016-03-14 (×3): qty 1

## 2016-03-14 MED ORDER — TOPIRAMATE 25 MG PO TABS
150.0000 mg | ORAL_TABLET | Freq: Two times a day (BID) | ORAL | Status: DC
  Administered 2016-03-15 – 2016-03-17 (×5): 150 mg via ORAL
  Filled 2016-03-14 (×10): qty 2

## 2016-03-14 MED ORDER — LINACLOTIDE 145 MCG PO CAPS
145.0000 ug | ORAL_CAPSULE | Freq: Every day | ORAL | Status: DC
  Administered 2016-03-15 – 2016-03-17 (×3): 145 ug via ORAL
  Filled 2016-03-14 (×6): qty 1

## 2016-03-14 NOTE — [facility] Group Notes (Signed)
Pt attended karaoke group.  Devon Kingdon, MHT 

## 2016-03-14 NOTE — Progress Notes (Signed)
NUTRITION ASSESSMENT  Pt identified as at risk on the Malnutrition Screen Tool  INTERVENTION 1. Supplements: Continue Ensure Enlive po BID, each supplement provides 350 kcal and 20 grams of protein  NUTRITION DIAGNOSIS: Unintentional weight loss related to sub-optimal intake as evidenced by pt report.   Goal: Pt to meet >/= 90% of their estimated nutrition needs.  Monitor:  PO intake  Assessment:  Pt admitted with depression, anxiety and PTSD. Pt with history of a head injury last year. PMHx of lap band in 2011 and gastric bypass in 2015, which explains her major weight loss. Pt does endorse poor appetite. Pt has been ordered Ensure supplements, will continue order.  Height: Ht Readings from Last 1 Encounters:  03/13/16 5\' 3"  (1.6 m)    Weight: Wt Readings from Last 1 Encounters:  03/13/16 114 lb (51.71 kg)    Weight Hx: Wt Readings from Last 10 Encounters:  03/13/16 114 lb (51.71 kg)  12/26/14 164 lb (74.39 kg)  06/08/14 196 lb (88.905 kg)  07/13/13 215 lb (97.523 kg)  06/27/13 205 lb (92.987 kg)  03/31/13 225 lb (102.059 kg)    BMI:  Body mass index is 20.2 kg/(m^2). Pt meets criteria for normal range based on current BMI.  Estimated Nutritional Needs: Kcal: 25-30 kcal/kg Protein: > 1 gram protein/kg Fluid: 1 ml/kcal  Diet Order: Diet regular Room service appropriate?: Yes; Fluid consistency:: Thin Pt is also offered choice of unit snacks mid-morning and mid-afternoon.  Pt is eating as desired.   Lab results and medications reviewed.   Clayton Bibles, MS, RD, LDN Pager: [phone] After Hours Pager: [phone]

## 2016-03-14 NOTE — [facility] Group Notes (Addendum)
[place] LCSW Group Therapy 03/14/2016 1:15 PM Type of Therapy: Group Therapy Participation Level: Active  Participation Quality: Attentive, Sharing and Supportive  Affect: Depressed and Flat  Cognitive: Alert and Oriented  Insight: Developing/Improving and Engaged  Engagement in Therapy: Developing/Improving and Engaged  Modes of Intervention: Activity, Clarification, Confrontation, Discussion, Education, Exploration, Limit-setting, Orientation, Problem-solving, Rapport Building, Art therapist, Socialization and Support  Summary of Progress/Problems: Patient was attentive and engaged with speaker from [place]. Patient was attentive to speaker while they shared their story of dealing with mental health and overcoming it. Patient expressed interest in their programs and services and received information on their agency. Patient processed ways they can relate to the speaker.   Tilden Fossa, LCSW Clinical Social Worker [facility] [phone]
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
## 2016-03-14 NOTE — Progress Notes (Signed)
  D: Pt was flat and depressed, but was observed in the dayroom interacting with her peers. During med pass pt informed the writer that she usually takes her topamax bid at 150 mg each for total of 300mg . Pt has no other questions or concerns. Informed pt that info would be passed on to her Dr.  Loni Muse:  Support and encouragement was offered. 15 min checks continued for safety.  R: Pt remains safe.

## 2016-03-14 NOTE — Plan of Care (Signed)
Problem: Alteration in mood & ability to function due to Goal: LTG-Pt reports reduction in suicidal thoughts (Patient reports reduction in suicidal thoughts and is able to verbalize a safety plan for whenever patient is feeling suicidal)  Outcome: Progressing Pt. Was able to deny SI today. SHe is able to contract for safety and agrees to come to staff if this changes.

## 2016-03-14 NOTE — [facility] Suicide Risk Assessment (Addendum)
[facility] Admission Suicide Risk Assessment   Nursing information obtained from:   patient and chart  Demographic factors:   45 year old married female, lives with husband, has 93 year old daughter  currently not working Current Mental Status:   see below  Loss Factors:   history of head trauma, currently on workmans' comp, states that she has been unable to work or function at premorbid level since head trauma  Historical Factors:    reports history of depression, anxiety, since episode of head trauma last years  Risk Reduction Factors:   resilience   Total Time spent with patient: 45 minutes Principal Problem:  MDD, PTSD  Diagnosis:   Patient Active Problem List   Diagnosis Date Noted  . Major depressive disorder, recurrent, severe without psychotic behavior ([place]) [F33.2] 03/13/2016  . PTSD (post-traumatic stress disorder) [F43.10] 03/13/2016  . Chronic post-traumatic stress disorder (PTSD) [F43.12] 03/13/2016  . Depression [F32.9]   . Suicidal ideation [R45.851]   . Ureteral calculus [N20.1] 12/26/2014  . Ureteral calculus, right [N20.1] 03/31/2013     Continued Clinical Symptoms:  Alcohol Use Disorder Identification Test Final Score (AUDIT): 0 The "Alcohol Use Disorders Identification Test", Guidelines for Use in Primary Care, Second Edition.  World Pharmac[employer] [facility]). Score between 0-7:  no or low risk or alcohol related problems. Score between 8-15:  moderate risk of alcohol related problems. Score between 16-19:  high risk of alcohol related problems. Score 20 or above:  warrants further diagnostic evaluation for alcohol dependence and treatment.   CLINICAL FACTORS:  45 year old married female.   Reports worsening  depression , with recent onset ideations of " giving up " and overdosing on medications. Reports a sense of sadness and of having become a burden to her family due to inability to work. 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 She states she had been started on Valium for anxiety, but that it has  not worked and she feels they might be making her nightmares worse . Reports she had head trauma after she fell off a stool at work last year. She states she has been having headaches since then. She states she has been unable to work consistently due to worsening memory, headaches , inability to drive . She states she is under " a lot of stress", related to not having a neurologist, states she was seeing a Neurologist , who left practice, so she is now followed by PCP. Has been unable to see a new neurologist , because " they don't take workman's comp".  No history of prior psychiatric admissions , denies history of suicide attempts, states symptoms started after head trauma. Denies alcohol, denies drug abuse  Dx- MDD, PTSD Plan- Inpatient admission  Have reviewed medication history with patient. Reports she has been consistently  taking Neurontin 300 mgrs TID, Topamax 150 mgrs BID, Flexeril 10 mgrs TID for several months, without side effects and wants to continue them. As does not feel Valium has helped, will D/C - Librium PRNs for potential WDL symptoms as needed  States she had been on Zoloft 100 mgrs QDAY x several months and was just recently titrated up to [address] [place] for history of constipation. ( Denies medication side effects from above med list )       Musculoskeletal: Strength & Muscle Tone: within normal limits Gait & Station: normal Patient leans:  N/A  Psychiatric Specialty Exam: ROS  Blood pressure 81/56, pulse 90, temperature 97.5 F (36.4 C), temperature source Oral, resp. rate 16, height 5\' 3"  (1.6 m), weight 114 lb (51.71 kg).Body mass index is 20.2 kg/(m^2).  General Appearance: Well Groomed  Engineer, water::  Good  Speech:  Normal Rate  Volume:  Decreased  Mood:  Depressed   Affect:  constricted, anxious  Thought Process:  Linear  Orientation:  Other:  fully alert and attentive   Thought Content:  denies hallucinations, no delusions, not internally preoccupied   Suicidal Thoughts:  No denies plan or intention of suicide or of hurting herself and contracts for safety on unit   Homicidal Thoughts:  No  Memory:  recernt and remote grossly intact   Judgement:  Fair  Insight:  Fair  Psychomotor Activity:  Normal  Concentration:  Good  Recall:  Good  Fund of Knowledge:Good  Language: Good  Akathisia:  No   Handed:  Right  AIMS (if indicated):     Assets:  Desire for Improvement Resilience  Sleep:  Number of Hours: 6.75  Cognition: WNL  ADL's:  Intact    COGNITIVE FEATURES THAT CONTRIBUTE TO RISK:  Closed-mindedness and Loss of executive function    SUICIDE RISK:   Moderate:  Frequent suicidal ideation with limited intensity, and duration, some specificity in terms of plans, no associated intent, good self-control, limited dysphoria/symptomatology, some risk factors present, and identifiable protective factors, including available and accessible social support.  PLAN OF CARE: Patient will be admitted to inpatient psychiatric unit for stabilization and safety. Will provide and encourage milieu participation. Provide medication management and maked adjustments as needed.  Will follow daily.    I certify that inpatient services furnished can reasonably be expected to improve the patient's condition.   Neita Garnet, MD 03/14/2016, 3:43 PM

## 2016-03-14 NOTE — Progress Notes (Addendum)
D) Pt. Has been calm and cooperative on the unit this shift. She denies SI/HI?AVH. She reports pain of 10/10 in her mif head this am and received tylenol. Pt. Reports 0 BM for two days and states she has medication from her medical MD that is not being given.A) Tylenol was only minimally effective, bringing pain to 8/10. Emotional support and small snack provided, with better results, bringing pain to 6/10. MD notified of medications for constipation. R) Continue to maintain safety while on the unit.

## 2016-03-14 NOTE — H&P (Signed)
Psychiatric Admission Assessment Adult  Patient Identification: Morgan [name]  MRN:  [id number]  Date of Evaluation:  03/14/2016  Chief Complaint:  PTSD chronic MDD single episode severe   Principal Diagnosis: Major depressive disorder, recurrent episodes, PTSD  Diagnosis:   Patient Active Problem List   Diagnosis Date Noted  . Major depressive disorder, recurrent, severe without psychotic behavior ([place]) [F33.2] 03/13/2016  . PTSD (post-traumatic stress disorder) [F43.10] 03/13/2016  . Chronic post-traumatic stress disorder (PTSD) [F43.12] 03/13/2016  . Depression [F32.9]   . Suicidal ideation [R45.851]   . Ureteral calculus [N20.1] 12/26/2014  . Ureteral calculus, right [N20.1] 03/31/2013   History of Present Illness: Morgan [name] is a 45 year old Caucasian female. Admitted to [facility] from the [facility] ED with complaints of suicidal ideations & nightmares since sustaining a concussion in May of 2015. 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 I was sitting on a stool. In an instant, I felt very dizzy, fell of my stool & hit my head on a concrete floor. I was started on medication. Then came September 2016, I developed panic attacks. I was put on Valium for the panic attacks. After starting the Valium, I developed nightmares. Since my concussion, my memory has gone bad. I can't remember much any more. Both my long & short term memories are affected. I have bad neck pain since all these began, constant headaches. I have been to [facility], they have done CT Scan of my head, still no definite answer for me. I have also been to the [facility] here in [place] for some kind of test. I see a Neurologist in [place], Dr. Catalina Gravel & a psychologist in [place], [place]. I need help. I believe that Valium is causing me a lot of problems".  Associated  Signs/Symptoms:  Depression Symptoms:  depressed mood, insomnia, impaired memory, anxiety, panic attacks,  (Hypo) Manic Symptoms:  Denies any hypomanic symptoms  Anxiety Symptoms:  Excessive Worry, Panic Symptoms,  Psychotic Symptoms:  Denies any hallucinations, delusions or paranoia  PTSD Symptoms: Re-experiencing:  Flashbacks Nightmares  Total Time spent with patient: 1 hour  Past Psychiatric History: Major depressive disorder  Is the patient at risk to self? No.  Has the patient been a risk to self in the past 6 months? No.  Has the patient been a risk to self within the distant past? No.  Is the patient a risk to others? No.  Has the patient been a risk to others in the past 6 months? No.  Has the patient been a risk to others within the distant past? No.   Prior Inpatient Therapy: Prior Inpatient Therapy: No Prior Therapy Dates: na Prior Therapy Facilty/Provider(s): na Reason for Treatment: na Prior Outpatient Therapy: Prior Outpatient Therapy: No Prior Therapy Dates: na Prior Therapy Facilty/Provider(s): na Reason for Treatment: na Does patient have an ACCT team?: No Does patient have Intensive In-House Services?  : No Does patient have [facility] services? : No Does patient have P4CC services?: No  Alcohol Screening: Patient refused Alcohol Screening Tool: Yes 1. How often do you have a drink containing alcohol?: Never 2. How many drinks containing alcohol do you have on a typical day when you are drinking?: 1 or 2 3. How often do you have six or more drinks on one occasion?: Never Preliminary Score: 0 4. How often during the last  year have you found that you were not able to stop drinking once you had started?: Never 5. How often during the last year have you failed to do what was normally expected from you becasue of drinking?: Never 6. How often during the last year have you needed a first drink in the morning to get yourself going after a heavy drinking  session?: Never 7. How often during the last year have you had a feeling of guilt of remorse after drinking?: Never 8. How often during the last year have you been unable to remember what happened the night before because you had been drinking?: Never 9. Have you or someone else been injured as a result of your drinking?: No 10. Has a relative or friend or a doctor or another health worker been concerned about your drinking or suggested you cut down?: No Alcohol Use Disorder Identification Test Final Score (AUDIT): 0 Brief Intervention: AUDIT score less than 7 or less-screening does not suggest unhealthy drinking-brief intervention not indicated  Substance Abuse History in the last 12 months:  No.  Consequences of Substance Abuse: Denies any substance abuse history  Previous Psychotropic Medications: Yes (Sertraline, Valium, Trazodone)  Psychological Evaluations: Yes   Past Medical History:  Past Medical History  Diagnosis Date  . Right ureteral stone   . RLS (restless legs syndrome)   . Acid reflux   . Hematuria   . Renal calculi BILATERAL  . History of kidney stones   . PONV (postoperative nausea and vomiting)   . Hypothyroidism   . H. pylori infection     hx of , none now  . Family history of adverse reaction to anesthesia     sister has PONV  . Headache     migraines, uses imitrex as needed    Past Surgical History  Procedure Laterality Date  . Laparoscopic assisted vaginal hysterectomy  MARCH 2014    W/ BILATERAL SALPINGOOPHORECTOMY  . Cholecystectomy    . Laparoscopic gastric banding  SEPT 2011  . Tonsillectomy and adenoidectomy  AGE 30'S  . Ureterolithotomy  NOV 2013  . Cystoscopy/retrograde/ureteroscopy Right 03/31/2013    Procedure: CYSTOSCOPY/RETROGRADE/URETEROSCOPY;  Surgeon: Bernestine Amass, MD;  Location: [facility];  Service: Urology;  Laterality: Right;  . Holmium laser application Right 0/24/0973    Procedure: HOLMIUM LASER APPLICATION;   Surgeon: Bernestine Amass, MD;  Location[facility];  Service: Urology;  Laterality: Right;  . Gastric bypass  03/15/14  . Abdominal hysterectomy    . Cystoscopy with retrograde pyelogram, ureteroscopy and stent placement Left 12/26/2014    Procedure: CYSTOSCOPY WITH RETROGRADE PYELOGRAM, URETEROSCOPY AND STENT PLACEMENT;  Surgeon: Bernestine Amass, MD;  Location[facility];  Service: Urology;  Laterality: Left;  . Holmium laser application Left 03/21/2991    Procedure: HOLMIUM LASER APPLICATION;  Surgeon: Bernestine Amass, MD;  Location: [facility];  Service: Urology;  Laterality: Left;   Family History: History reviewed. No pertinent family history.  Family Psychiatric  History: Denies any familial Hx of mental illness.  Tobacco Screening: Denies cigarette smoking or use of tobacco products  Social History:  History  Alcohol Use No     History  Drug Use No    Additional Social History: Marital status: Married    Pain Medications: See PTA med list Prescriptions: See PTA med list Over the Counter: See PTA med list History of alcohol / drug use?: No history of alcohol / drug abuse  Allergies:   Allergies  Allergen Reactions  . Clindamycin/Lincomycin Hives  . Nsaids     Gastric Bypass  . Oxycodone Other (See Comments)    Makes her mean spirited.    Lab Results:  Results for orders placed or performed during the hospital encounter of 03/12/16 (from the past 48 hour(s))  Urine rapid drug screen (hosp performed) (Not at [facility])     Status: Abnormal   Collection Time: 03/12/16 11:03 PM  Result Value Ref Range   Opiates NONE DETECTED NONE DETECTED   Cocaine NONE DETECTED NONE DETECTED   Benzodiazepines POSITIVE (A) NONE DETECTED   Amphetamines NONE DETECTED NONE DETECTED   Tetrahydrocannabinol NONE DETECTED NONE DETECTED   Barbiturates NONE DETECTED NONE DETECTED    Comment:        DRUG SCREEN FOR MEDICAL PURPOSES ONLY.  IF CONFIRMATION  IS NEEDED FOR ANY PURPOSE, NOTIFY LAB WITHIN 5 DAYS.        LOWEST DETECTABLE LIMITS FOR URINE DRUG SCREEN Drug Class       Cutoff (ng/mL) Amphetamine      1000 Barbiturate      200 Benzodiazepine   453 Tricyclics       646 Opiates          300 Cocaine          300 THC              50   Comprehensive metabolic panel     Status: Abnormal   Collection Time: 03/12/16 11:32 PM  Result Value Ref Range   Sodium 141 135 - 145 mmol/L   Potassium 4.1 3.5 - 5.1 mmol/L   Chloride 109 101 - 111 mmol/L   CO2 27 22 - 32 mmol/L   Glucose, Bld 107 (H) 65 - 99 mg/dL   BUN 11 6 - 20 mg/dL   Creatinine, Ser 0.98 0.44 - 1.00 mg/dL   Calcium 9.5 8.9 - 10.3 mg/dL   Total Protein 6.9 6.5 - 8.1 g/dL   Albumin 4.2 3.5 - 5.0 g/dL   AST 38 15 - 41 U/L   ALT 40 14 - 54 U/L   Alkaline Phosphatase 87 38 - 126 U/L   Total Bilirubin 0.2 (L) 0.3 - 1.2 mg/dL   GFR calc non Af Amer >60 >60 mL/min   GFR calc Af Amer >60 >60 mL/min    Comment: (NOTE) The eGFR has been calculated using the CKD EPI equation. This calculation has not been validated in all clinical situations. eGFR's persistently <60 mL/min signify possible Chronic Kidney Disease.    Anion gap 5 5 - 15  Ethanol (ETOH)     Status: None   Collection Time: 03/12/16 11:32 PM  Result Value Ref Range   Alcohol, Ethyl (B) <5 <5 mg/dL    Comment:        LOWEST DETECTABLE LIMIT FOR SERUM ALCOHOL IS 5 mg/dL FOR MEDICAL PURPOSES ONLY   Salicylate level     Status: None   Collection Time: 03/12/16 11:32 PM  Result Value Ref Range   Salicylate Lvl <8.0 2.8 - 30.0 mg/dL  Acetaminophen level     Status: Abnormal   Collection Time: 03/12/16 11:32 PM  Result Value Ref Range   Acetaminophen (Tylenol), Serum <10 (L) 10 - 30 ug/mL    Comment:        THERAPEUTIC CONCENTRATIONS VARY SIGNIFICANTLY. A RANGE OF 10-30 ug/mL MAY BE AN EFFECTIVE CONCENTRATION FOR MANY PATIENTS. HOWEVER, SOME ARE BEST TREATED AT CONCENTRATIONS OUTSIDE  THIS RANGE. ACETAMINOPHEN CONCENTRATIONS >150 ug/mL AT 4 HOURS AFTER INGESTION AND >50 ug/mL AT 12 HOURS AFTER INGESTION ARE OFTEN ASSOCIATED WITH TOXIC REACTIONS.   CBC     Status: None   Collection Time: 03/12/16 11:32 PM  Result Value Ref Range   WBC 5.4 4.0 - 10.5 K/uL   RBC 4.79 3.87 - 5.11 MIL/uL   Hemoglobin 13.2 12.0 - 15.0 g/dL   HCT 63.4 24.4 - 48.8 %   MCV 83.1 78.0 - 100.0 fL   MCH 27.6 26.0 - 34.0 pg   MCHC 33.2 30.0 - 36.0 g/dL   RDW 06.9 07.3 - 17.5 %   Platelets 185 150 - 400 K/uL    Blood Alcohol level:  Lab Results  Component Value Date   ETH <5 03/12/2016   Metabolic Disorder Labs:  No results found for: HGBA1C, MPG No results found for: PROLACTIN No results found for: CHOL, TRIG, HDL, CHOLHDL, VLDL, LDLCALC  Current Medications: Current Facility-Administered Medications  Medication Dose Route Frequency Provider Last Rate Last Dose  . acetaminophen (TYLENOL) tablet 650 mg  650 mg Oral Q6H PRN Earney Navy, NP      . acetaminophen (TYLENOL) tablet 650 mg  650 mg Oral Q6H PRN Earney Navy, NP   650 mg at 03/14/16 0815  . alum & mag hydroxide-simeth (MAALOX/MYLANTA) 200-200-20 MG/5ML suspension 30 mL  30 mL Oral Q4H PRN Earney Navy, NP      . cyclobenzaprine (FLEXERIL) tablet 10 mg  10 mg Oral TID PRN Earney Navy, NP      . diazepam (VALIUM) tablet 5 mg  5 mg Oral Daily PRN Earney Navy, NP      . feeding supplement (ENSURE ENLIVE) (ENSURE ENLIVE) liquid 237 mL  237 mL Oral BID BM Craige Cotta, MD   237 mL at 03/14/16 0944  . gabapentin (NEURONTIN) capsule 300 mg  300 mg Oral TID Earney Navy, NP   300 mg at 03/14/16 0813  . levothyroxine (SYNTHROID, LEVOTHROID) tablet 88 mcg  88 mcg Oral QAC breakfast Earney Navy, NP   88 mcg at 03/14/16 1285  . magnesium hydroxide (MILK OF MAGNESIA) suspension 30 mL  30 mL Oral Daily PRN Earney Navy, NP      . montelukast (SINGULAIR) tablet 10 mg  10 mg Oral Daily  Earney Navy, NP   10 mg at 03/14/16 0813  . multivitamin with minerals tablet 1 tablet  1 tablet Oral Daily Earney Navy, NP   1 tablet at 03/14/16 0813  . ondansetron (ZOFRAN) tablet 4 mg  4 mg Oral Q8H PRN Earney Navy, NP      . prazosin (MINIPRESS) capsule 1 mg  1 mg Oral QHS Earney Navy, NP   1 mg at 03/13/16 2200  . sertraline (ZOLOFT) tablet 200 mg  200 mg Oral QHS Earney Navy, NP   200 mg at 03/13/16 2143  . topiramate (TOPAMAX) tablet 100 mg  100 mg Oral Daily Earney Navy, NP   100 mg at 03/14/16 0813  . topiramate (TOPAMAX) tablet 50 mg  50 mg Oral QHS Earney Navy, NP   50 mg at 03/13/16 2143  . traZODone (DESYREL) tablet 50 mg  50 mg Oral QHS Earney Navy, NP   50 mg at 03/13/16 2143   PTA Medications: Prescriptions prior to admission  Medication Sig Dispense Refill Last Dose  . cyclobenzaprine (FLEXERIL) 10 MG tablet Take 10  mg by mouth 3 (three) times daily as needed for muscle spasms.   03/12/2016 at Unknown time  . diazepam (VALIUM) 5 MG tablet Take 5-10 mg by mouth at bedtime. 10 mg at night May also take 5 mg as needed for anxiety  0 03/11/2016 at Unknown time  . gabapentin (NEURONTIN) 300 MG capsule Take 300 mg by mouth 3 (three) times daily.    03/12/2016 at 1200  . ipratropium-albuterol (DUONEB) 0.5-2.5 (3) MG/3ML SOLN Inhale 3 mLs into the lungs every 6 (six) hours as needed (SOB, wheezing).    Past Month at Unknown time  . levothyroxine (LEVOTHROID) 88 MCG tablet Take 88 mcg by mouth daily before breakfast.   03/11/2016 at Unknown time  . Melatonin (RA MELATONIN) 10 MG TABS Take 10 mg by mouth at bedtime.   03/11/2016 at Unknown time  . Meth-Hyo-M Bl-Na Phos-Ph Sal (URIBEL) 118 MG CAPS Take 1 capsule (118 mg total) by mouth 3 (three) times daily as needed. (Patient not taking: Reported on 03/12/2016) 15 capsule 1 Not Taking at Unknown time  . montelukast (SINGULAIR) 10 MG tablet Take 10 mg by mouth daily.   03/12/2016 at Unknown  time  . Multiple Vitamin (MULTIVITAMIN WITH MINERALS) TABS tablet Take 1 tablet by mouth daily.   03/12/2016 at Unknown time  . ondansetron (ZOFRAN ODT) 4 MG disintegrating tablet Take 4 mg by mouth every 8 (eight) hours as needed for nausea.    03/11/2016 at Unknown time  . sertraline (ZOLOFT) 100 MG tablet Take 200 mg by mouth at bedtime.    03/11/2016 at Unknown time  . topiramate (TOPAMAX) 100 MG tablet Take 100 mg by mouth daily.  11 03/12/2016 at Unknown time  . topiramate (TOPAMAX) 50 MG tablet Take 50 mg by mouth at bedtime.    03/11/2016 at Unknown time  . traMADol (ULTRAM) 50 MG tablet Take 50 mg by mouth every 6 (six) hours as needed for moderate pain (kidney stones).   03/12/2016 at Unknown time  . traZODone (DESYREL) 50 MG tablet Take 100 mg by mouth at bedtime.    03/11/2016 at Unknown time   Musculoskeletal: Strength & Muscle Tone: within normal limits Gait & Station: normal Patient leans: N/A  Psychiatric Specialty Exam: Physical Exam  Constitutional: She is oriented to person, place, and time. She appears well-developed and well-nourished.  HENT:  Head: Normocephalic.  Eyes: Pupils are equal, round, and reactive to light.  Neck: Normal range of motion.  Cardiovascular: Normal rate.   Respiratory: Effort normal.  GI: Soft.  Genitourinary:  Denies any issues in this area  Musculoskeletal: Normal range of motion.  Neurological: She is alert and oriented to person, place, and time.  Skin: Skin is warm and dry.  Psychiatric: Her speech is normal and behavior is normal. Thought content normal. Her mood appears anxious. Her affect is angry (Intermitent). Her affect is not blunt, not labile and not inappropriate. Cognition and memory are normal. She expresses impulsivity. She exhibits a depressed mood.    Review of Systems  Constitutional: Negative.   Eyes: Negative.   Respiratory: Negative.   Cardiovascular: Negative.   Gastrointestinal: Negative.   Genitourinary: Negative.    Musculoskeletal: Negative.   Skin: Negative.   Neurological: Positive for headaches.  Endo/Heme/Allergies: Negative.   Psychiatric/Behavioral: Positive for depression and suicidal ideas. Negative for hallucinations, memory loss and substance abuse. The patient is nervous/anxious and has insomnia.     Blood pressure 81/56, pulse 90, temperature 97.5 F (36.4 C), temperature  source Oral, resp. rate 16, height '5\' 3"'$  (1.6 m), weight 51.71 kg (114 lb).Body mass index is 20.2 kg/(m^2).  General Appearance: Casual, tearful  Eye Contact::  Fair  Speech:  Clear and Coherent  Volume:  Decreased  Mood:  Anxious and Depressed  Affect:  Tearful  Thought Process:  Intact and Logical  Orientation:  Full (Time, Place, and Person)  Thought Content:  Ruminations, denies any hallucinations  Suicidal Thoughts:  No, Patient denies  Homicidal Thoughts:  No, Patient denies  Memory:  Immediate;   Fair Recent;   Fair Remote;   Poor  Judgement:  Fair  Insight:  Present  Psychomotor Activity:  Decreased  Concentration:  Fair  Recall:  [employer] of Knowledge:Fair  Language: Good  Akathisia:  Negative  Handed:  Right  AIMS (if indicated):     Assets:  Communication Skills Desire for Improvement  ADL's:  Intact  Cognition: Impaired,  Mild  Sleep:  Number of Hours: 6.75   Treatment Plan/Recommendations: 1. Admit for crisis management and stabilization, estimated length of stay 3-5 days.  2. Medication management to reduce current symptoms to base line and improve the patient's overall level of functioning;Resume Sertraline 200 mg for depression, Topamax 25 mg for headaches, Trazodone 100 mg for insomnia, Minipress 1 mg for nightmares,Gabapentin 300 mg for agitation, Flexeril 10 mg for muscle spasms, Synthroid 88 mcg for Hypothyroidism, Singulair 10 mg for COPD  3. Treat health problems as indicated.  4. Develop treatment plan to decrease risk of relapse upon discharge and the need for readmission.  5.  Psycho-social education regarding relapse prevention and self care.  6. Health care follow up as needed for medical problems.  7. Review, reconcile, and reinstate any pertinent home medications for other health issues where appropriate. 8. Call for consults with hospitalist for any additional specialty patient care services as needed.  Observation Level/Precautions:  15 minute checks  Laboratory:  Per ED  Psychotherapy: Group mileu   Medications: Resume Sertraline 200 mg for depression, Topamax 25 mg for headaches, Trazodone 100 mg for insomnia, Minipress 1 mg for nightmares,Gabapentin 300 mg for agitation, Flexeril 10 mg for muscle spasms, Synthroid 88 mcg for hypothyroidism,    Consultations: As needed   Discharge Concerns: Safety, mood stability  Estimated LOS: 3-5 days  Other: admit to [id number]   I certify that inpatient services furnished can reasonably be expected to improve the patient's condition.    Encarnacion Slates, NP, PMHNP-BC 4/27/201711:53 AM Case reviewed with NP and patient seen by me Agree with NP note and assessment  45 year old married female.  Reports worsening depression , with recent onset ideations of " giving up " and overdosing on medications. Reports a sense of sadness and of having become a burden to her family due to inability to work. 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 She states she had been started on Valium for anxiety, but that it has not worked and she feels they might be making her nightmares worse . Reports she had head trauma after she fell off a stool at work last year. She states she has been having headaches since then. She states she has been unable to work consistently due to worsening memory, headaches , inability to drive . She states she is under " a lot of stress", related to not having a neurologist,  states she was seeing a Garment/textile technologist ,  who left practice, so she is now followed by PCP. Has been unable to see a new neurologist , because " they don't take workman's comp".  No history of prior psychiatric admissions , denies history of suicide attempts, states symptoms started after head trauma. Denies alcohol, denies drug abuse  Dx- MDD, PTSD Plan- Inpatient admission  Have reviewed medication history with patient. Reports she has been consistently taking Neurontin 300 mgrs TID, Topamax 150 mgrs BID, Flexeril 10 mgrs TID for several months, without side effects and wants to continue them. As does not feel Valium has helped, will D/C - Librium PRNs for potential WDL symptoms as needed  States she had been on Zoloft 100 mgrs QDAY x several months and was just recently titrated up to 200 [place] for history of constipation. ( Denies medication side effects from above med list )

## 2016-03-14 NOTE — [facility] Group Notes (Signed)
The focus of this group is to educate the patient on the purpose and policies of crisis stabilization and provide a format to answer questions about their admission.  The group details unit policies and expectations of patients while admitted.  Patient attended 0900 nurse education orientation group this morning.  Patient actively participated and had appropriate affect.  Patient is alert.  Patient had appropriate insight and appropriate engagement.  Today patient will work on 3 goals for discharge.  

## 2016-03-14 NOTE — Progress Notes (Signed)
Morgan [name] states she had a good day. She rates anxiety and Depression 2/10. Her goal today was to "try not to get upset and cry and to start focusing on myself". Denies SI/HI/AVH. Encouragement and support given. Medications administered as prescribed. Continue to monitor Q 15 minutes for patient safety and medication effectiveness.

## 2016-03-15 NOTE — Progress Notes (Signed)
[facility] MD Progress Note  03/15/2016 3:45 PM RONNICA DREESE  MRN:  [id number] Subjective:   Patient states she is feeling better, less depressed.  At this time denies medication side effects. Objective : I have discussed case with treatment team and have met with patient . Patient presents with improved mood and improved range of affect . She describes feeling better than she had been prior to admission  She is not endorsing medication  side effects . She tolerated low dose  Minipress well, and states that she did not have nightmares last night and slept better. At this time denies any suicidal ideations, and states she is feeling better, more hopeful. No disruptive or agitated behaviors on unit, going to groups.  Principal Problem: Chronic post-traumatic stress disorder (PTSD) Diagnosis:   Patient Active Problem List   Diagnosis Date Noted  . Major depressive disorder, recurrent, severe without psychotic behavior (HCC) [F33.2] 03/13/2016  . PTSD (post-traumatic stress disorder) [F43.10] 03/13/2016  . Chronic post-traumatic stress disorder (PTSD) [F43.12] 03/13/2016  . Depression [F32.9]   . Suicidal ideation [R45.851]   . Ureteral calculus [N20.1] 12/26/2014  . Ureteral calculus, right [N20.1] 03/31/2013   Total Time spent with patient: 20 minutes    Past Medical History:  Past Medical History  Diagnosis Date  . Right ureteral stone   . RLS (restless legs syndrome)   . Acid reflux   . Hematuria   . Renal calculi BILATERAL  . History of kidney stones   . PONV (postoperative nausea and vomiting)   . Hypothyroidism   . H. pylori infection     hx of , none now  . Family history of adverse reaction to anesthesia     sister has PONV  . Headache     migraines, uses imitrex as needed    Past Surgical History  Procedure Laterality Date  . Laparoscopic assisted vaginal hysterectomy  MARCH 2014    W/ BILATERAL SALPINGOOPHORECTOMY  . Cholecystectomy    . Laparoscopic gastric  banding  SEPT 2011  . Tonsillectomy and adenoidectomy  AGE 45'S  . Ureterolithotomy  NOV 2013  . Cystoscopy/retrograde/ureteroscopy Right 03/31/2013    Procedure: CYSTOSCOPY/RETROGRADE/URETEROSCOPY;  Surgeon: Valetta Fuller, MD;  Location: [facility];  Service: Urology;  Laterality: Right;  . Holmium laser application Right 03/31/2013    Procedure: HOLMIUM LASER APPLICATION;  Surgeon: Valetta Fuller, MD;  Location: [facility];  Service: Urology;  Laterality: Right;  . Gastric bypass  03/15/14  . Abdominal hysterectomy    . Cystoscopy with retrograde pyelogram, ureteroscopy and stent placement Left 12/26/2014    Procedure: CYSTOSCOPY WITH RETROGRADE PYELOGRAM, URETEROSCOPY AND STENT PLACEMENT;  Surgeon: Valetta Fuller, MD;  Location: [facility];  Service: Urology;  Laterality: Left;  . Holmium laser application Left 12/26/2014    Procedure: HOLMIUM LASER APPLICATION;  Surgeon: Valetta Fuller, MD;  Location: [facility];  Service: Urology;  Laterality: Left;   Family History: History reviewed. No pertinent family history.  Social History:  History  Alcohol Use No     History  Drug Use No    Social History   Social History  . Marital Status: Married    Spouse Name: N/A  . Number of Children: N/A  . Years of Education: N/A   Social History Main Topics  . Smoking status: Never Smoker   . Smokeless tobacco: Never Used  . Alcohol Use: No  . Drug Use: No  .  Sexual Activity: Not Asked   Other Topics Concern  . None   Social History Narrative   Additional Social History:    Pain Medications: See PTA med list Prescriptions: See PTA med list Over the Counter: See PTA med list History of alcohol / drug use?: No history of alcohol / drug abuse  Sleep: improved   Appetite:  Good  Current Medications: Current Facility-Administered Medications  Medication Dose Route Frequency Provider Last Rate Last Dose  . acetaminophen  (TYLENOL) tablet 650 mg  650 mg Oral Q6H PRN Delfin Gant, NP      . alum & mag hydroxide-simeth (MAALOX/MYLANTA) 200-200-20 MG/5ML suspension 30 mL  30 mL Oral Q4H PRN Delfin Gant, NP      . chlordiazePOXIDE (LIBRIUM) capsule 25 mg  25 mg Oral QID PRN Encarnacion Slates, NP      . cyclobenzaprine (FLEXERIL) tablet 10 mg  10 mg Oral TID Jenne Campus, MD   10 mg at 03/15/16 1203  . feeding supplement (ENSURE ENLIVE) (ENSURE ENLIVE) liquid 237 mL  237 mL Oral BID BM Myer Peer Cobos, MD   237 mL at 03/15/16 1000  . gabapentin (NEURONTIN) capsule 300 mg  300 mg Oral TID Delfin Gant, NP   300 mg at 03/15/16 1203  . levothyroxine (SYNTHROID, LEVOTHROID) tablet 88 mcg  88 mcg Oral QAC breakfast Delfin Gant, NP   88 mcg at 03/15/16 9470  . linaclotide (LINZESS) capsule 145 mcg  145 mcg Oral QAC breakfast Jenne Campus, MD   145 mcg at 03/15/16 0846  . magnesium hydroxide (MILK OF MAGNESIA) suspension 30 mL  30 mL Oral Daily PRN Delfin Gant, NP      . montelukast (SINGULAIR) tablet 10 mg  10 mg Oral Daily Delfin Gant, NP   10 mg at 03/15/16 0823  . multivitamin with minerals tablet 1 tablet  1 tablet Oral Daily Delfin Gant, NP   1 tablet at 03/15/16 0823  . ondansetron (ZOFRAN) tablet 4 mg  4 mg Oral Q8H PRN Delfin Gant, NP      . prazosin (MINIPRESS) capsule 1 mg  1 mg Oral QHS Delfin Gant, NP   1 mg at 03/14/16 2238  . sertraline (ZOLOFT) tablet 200 mg  200 mg Oral QHS Delfin Gant, NP   200 mg at 03/14/16 2238  . topiramate (TOPAMAX) tablet 150 mg  150 mg Oral BID Jenne Campus, MD   150 mg at 03/15/16 0823  . traZODone (DESYREL) tablet 100 mg  100 mg Oral QHS Encarnacion Slates, NP   100 mg at 03/14/16 2239    Lab Results: No results found for this or any previous visit (from the past 48 hour(s)).  Blood Alcohol level:  Lab Results  Component Value Date   ETH <5 03/12/2016    Physical Findings: AIMS: Facial and Oral  Movements Muscles of Facial Expression: None, normal Lips and Perioral Area: None, normal Jaw: None, normal Tongue: None, normal,Extremity Movements Upper (arms, wrists, hands, fingers): None, normal Lower (legs, knees, ankles, toes): None, normal, Trunk Movements Neck, shoulders, hips: None, normal, Overall Severity Severity of abnormal movements (highest score from questions above): None, normal Incapacitation due to abnormal movements: None, normal Patient's awareness of abnormal movements (rate only patient's report): No Awareness, Dental Status Current problems with teeth and/or dentures?: No Does patient usually wear dentures?: No  CIWA:  CIWA-Ar Total: 1 COWS:  COWS Total Score: 2  Musculoskeletal: Strength & Muscle Tone: within normal limits Gait & Station: normal Patient leans: N/A  Psychiatric Specialty Exam: ROS chronic headaches, but at this time no severe headache reported, no chest pain, no shortness of breath, no vomiting   Blood pressure 87/50, pulse 92, temperature 98.4 F (36.9 C), temperature source Oral, resp. rate 16, height '5\' 3"'$  (1.6 m), weight 114 lb (51.71 kg).Body mass index is 20.2 kg/(m^2).  General Appearance: Well Groomed  Engineer, water::  Good  Speech:  Normal Rate  Volume:  Normal  Mood:  less depressed, improving   Affect:  more reactive   Thought Process:  Linear  Orientation:  Other:  fully alert and attentive   Thought Content:  denies hallucinations, no delusions   Suicidal Thoughts:  No denies any suicidal ideations, denies any homicidal ideations   Homicidal Thoughts:  No  Memory:  recent and remote grossly intact   Judgement:  Other:  improving   Insight:  improving   Psychomotor Activity:  Normal  Concentration:  Good  Recall:  Good  Fund of Knowledge:Good  Language: Good  Akathisia:  Negative  Handed:  Right  AIMS (if indicated):     Assets:  Desire for Improvement Resilience  ADL's:  Intact  Cognition: WNL  Sleep:  Number of  Hours: 5.75  Assessment - patient reports improvement of mood, and presents with fuller range of affect, less constricted in affect. No SI at this time. Has history of PTSD- states she had no nightmares last night and slept better on low dose Minipress . At this time not endorsing medication side effects.  Treatment Plan Summary: Daily contact with patient to assess and evaluate symptoms and progress in treatment, Medication management, Plan inpatient treatment and medications as below  Encourage ongoing milieu and group participation to work on coping skills and symptom reduction Continue Minipress 1 mgr QHS for PTSD related nightmares- patient encouraged to drink fluids in order to minimize potential orthostatic side effects  Continue Zoloft 200 mgrs QDAY for depression and PTSD Continue Neurontin 300 mgrs TID for pain and anxiety  Continue Topamax 150 mgrs BID for chronic headaches  Treatment team working on discharge planning options  Neita Garnet, MD 03/15/2016, 3:45 PM

## 2016-03-15 NOTE — BHH Suicide Risk Assessment (Addendum)
[place] INPATIENT:  Family/Significant Other Suicide Prevention Education  Suicide Prevention Education:  Education Completed; Arcie Chait, husband, [phone] has been identified by the patient as the family member/significant other with whom the patient will be residing, and identified as the person(s) who will aid the patient in the event of a mental health crisis (suicidal ideations/suicide attempt).  With written consent from the patient, the family member/significant other has been provided the following suicide prevention education, prior to the and/or following the discharge of the patient.  The suicide prevention education provided includes the following:  Suicide risk factors  Suicide prevention and interventions  National Suicide Hotline telephone number  [facility] assessment telephone number  [facility] Emergency Assistance [place] and/or Residential Mobile Crisis Unit telephone number  Request made of family/significant other to:  Remove weapons (e.g., guns, rifles, knives), all items previously/currently identified as safety concern.    Remove drugs/medications (over-the-counter, prescriptions, illicit drugs), all items previously/currently identified as a safety concern.  The family member/significant other verbalizes understanding of the suicide prevention education information provided.  The family member/significant other agrees to remove the items of safety concern listed above. Husband states he will take guns our of the home and give them to son for now for safe keeping.  Roque Lias B 03/15/2016, 1:53 PM

## 2016-03-15 NOTE — Progress Notes (Signed)
Patient ID: Morgan [name], female   DOB: 1971-07-02, 45 y.o.   MRN: [id number]   Pt currently presents with a flat affect and depressed, demanding behavior. Per self inventory, pt rates depression at a 2, hopelessness 0 and anxiety 2. Pt's daily goal is to "myself and getting home" and they intend to do so by "go to groups." Pt reports fair sleep, a good appetite, low energy and good concentration. Pt reports head pain 9/10 that has been chronic "since I fell a while ago."   Pt provided with medications per providers orders. Pt's labs and vitals were monitored throughout the day. Pt supported emotionally and encouraged to express concerns and questions. Pt educated on medications.  Pt's safety ensured with 15 minute and environmental checks. Pt currently denies SI/HI and A/V hallucinations. Pt verbally agrees to seek staff if SI/HI or A/VH occurs and to consult with staff before acting on these thoughts. Will continue POC.

## 2016-03-15 NOTE — Progress Notes (Signed)
  [facility] Adult Case Management Discharge Plan :  Will you be returning to the same living situation after discharge:  Yes,  patient plans to return home At discharge, do you have transportation home?: Yes,  patient reports access to transportation Do you have the ability to pay for your medications: Yes,  patient will be provided with prescriptions  Release of information consent forms completed and in the chart;  Patient's signature needed at discharge.  Patient to Follow up at: Follow-up Information    Follow up with [facility].   Why:  Referral made on 4/28. Social worker will call you within 2-3 business days of discharge with appointment information for therapy and medication management.    Contact information:   [address], [place] [address]      Next level of care provider has access to [place] and Suicide Prevention discussed: Yes,  with patient and husband  Have you used any form of tobacco in the last 30 days? (Cigarettes, Smokeless Tobacco, Cigars, and/or Pipes): No  Has patient been referred to the Quitline?: N/A patient is not a smoker  Patient has been referred for addiction treatment: Yes  Tamalyn Wadsworth, Casimiro Needle 03/15/2016, 5:43 PM

## 2016-03-15 NOTE — BHH Group Notes (Signed)
   [facility] LCSW Aftercare Discharge Planning Group Note  03/15/2016  8:45 AM   Participation Quality: Alert, Appropriate and Oriented  Mood/Affect: Depressed and Flat  Depression Rating: 2-3  Anxiety Rating: 2-3  Thoughts of Suicide: Pt denies SI/HI  Will you contract for safety? Yes  Current AVH: Pt denies  Plan for Discharge/Comments: Pt attended discharge planning group and actively participated in group. CSW provided pt with today's workbook. Patient plans to return home to follow up with outpatient services.   Transportation Means: Pt reports access to transportation  Supports: No supports mentioned at this time  Tilden Fossa, MSW, [employer] [facility] Clinical Social Worker [employer] [phone]
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
## 2016-03-16 LAB — GLUCOSE, CAPILLARY: Glucose-Capillary: 84 mg/dL (ref 65–99)

## 2016-03-16 MED ORDER — LUBIPROSTONE 24 MCG PO CAPS
24.0000 ug | ORAL_CAPSULE | Freq: Two times a day (BID) | ORAL | Status: DC
  Administered 2016-03-16 – 2016-03-17 (×2): 24 ug via ORAL
  Filled 2016-03-16 (×8): qty 1

## 2016-03-16 MED ORDER — DOCUSATE SODIUM 100 MG PO CAPS
200.0000 mg | ORAL_CAPSULE | Freq: Every day | ORAL | Status: DC
  Administered 2016-03-16: 200 mg via ORAL
  Filled 2016-03-16 (×3): qty 2

## 2016-03-16 NOTE — Progress Notes (Signed)
Patient ID: Morgan [name], female   DOB: Sep 06, 1971, 45 y.o.   MRN: [id number]   D: Pt has been very flat and depressed on the unit today. Pt reported that she just did not feel good due to not having a bowel movement since Monday. This Probation officer spoke to May NP, new orders noted for her home meds Amitiza and Colace. Pt reported that her depression was a 0, her hopelessness was a 0, and her anxiety was a 2. Pt reported that her goal for today was to work on herself. Pt reported being negative SI/HI, no AH/VH noted. A: 15 min checks continued for patient safety. R: Pt safety maintained.

## 2016-03-16 NOTE — Progress Notes (Signed)
D: Patient seen on day room. No interaction. Denies pain, SI, AH/VH. Patient stated that the she sleeps with a special pillow and that DR. Has already approves that. This writer could not seen any order/report concerning that. - To remind Dr. In the morning. Patient accepted her bedtime meds and went to bed.  A: Support and encouragement offered to patient. Due meds given as ordered. Every 15 minutes check for safety maintained. Will continue to monitor patient for safety and stability. R: Patient receptive to treatments.

## 2016-03-16 NOTE — Progress Notes (Signed)
Patient ID: Morgan [name], female   DOB: [birth year], 45 y.o.   MRN: [id number] [facility] MD Progress Note  03/16/2016 2:35 PM DEBROAH SHUTTLEWORTH  MRN:  [id number] Subjective:   Patient states she is feeling better, less depressed.   Looking forward to discharge. At this time denies medication side effects. Objective : I have discussed case with treatment team and have met with patient . Patient presents with improved mood and improved range of affect . She describes feeling better than she had been prior to admission  She is not endorsing medication  side effects . She tolerated low dose  Minipress well, and states that she has better sleep with help of the med.   No disruptive or agitated behaviors on unit, going to groups.  Principal Problem: Chronic post-traumatic stress disorder (PTSD) Diagnosis:   Patient Active Problem List   Diagnosis Date Noted  . Major depressive disorder, recurrent, severe without psychotic behavior ([place]) [F33.2] 03/13/2016  . PTSD (post-traumatic stress disorder) [F43.10] 03/13/2016  . Chronic post-traumatic stress disorder (PTSD) [F43.12] 03/13/2016  . Depression [F32.9]   . Suicidal ideation [R45.851]   . Ureteral calculus [N20.1] 12/26/2014  . Ureteral calculus, right [N20.1] 03/31/2013   Total Time spent with patient: 20 minutes    Past Medical History:  Past Medical History  Diagnosis Date  . Right ureteral stone   . RLS (restless legs syndrome)   . Acid reflux   . Hematuria   . Renal calculi BILATERAL  . History of kidney stones   . PONV (postoperative nausea and vomiting)   . Hypothyroidism   . H. pylori infection     hx of , none now  . Family history of adverse reaction to anesthesia     sister has PONV  . Headache     migraines, uses imitrex as needed    Past Surgical History  Procedure Laterality Date  . Laparoscopic assisted vaginal hysterectomy  MARCH 2014    W/ BILATERAL SALPINGOOPHORECTOMY  . Cholecystectomy    . Laparoscopic  gastric banding  SEPT 2011  . Tonsillectomy and adenoidectomy  AGE 71'S  . Ureterolithotomy  NOV 2013  . Cystoscopy/retrograde/ureteroscopy Right 03/31/2013    Procedure: CYSTOSCOPY/RETROGRADE/URETEROSCOPY;  Surgeon: Bernestine Amass, MD;  Location: [facility];  Service: Urology;  Laterality: Right;  . Holmium laser application Right 03/04/4080    Procedure: HOLMIUM LASER APPLICATION;  Surgeon: Bernestine Amass, MD;  Location: [facility];  Service: Urology;  Laterality: Right;  . Gastric bypass  03/15/14  . Abdominal hysterectomy    . Cystoscopy with retrograde pyelogram, ureteroscopy and stent placement Left 12/26/2014    Procedure: CYSTOSCOPY WITH RETROGRADE PYELOGRAM, URETEROSCOPY AND STENT PLACEMENT;  Surgeon: Bernestine Amass, MD;  Location: [facility];  Service: Urology;  Laterality: Left;  . Holmium laser application Left 02/20/8184    Procedure: HOLMIUM LASER APPLICATION;  Surgeon: Bernestine Amass, MD;  Location: [facility];  Service: Urology;  Laterality: Left;   Family History: History reviewed. No pertinent family history.  Social History:  History  Alcohol Use No     History  Drug Use No    Social History   Social History  . Marital Status: Married    Spouse Name: N/A  . Number of Children: N/A  . Years of Education: N/A   Social History Main Topics  . Smoking status: Never Smoker   . Smokeless tobacco: Never Used  . Alcohol Use: No  .  Drug Use: No  . Sexual Activity: Not Asked   Other Topics Concern  . None   Social History Narrative   Additional Social History:    Pain Medications: See PTA med list Prescriptions: See PTA med list Over the Counter: See PTA med list History of alcohol / drug use?: No history of alcohol / drug abuse  Sleep: improved   Appetite:  Good  Current Medications: Current Facility-Administered Medications  Medication Dose Route Frequency Provider Last Rate Last Dose  .  acetaminophen (TYLENOL) tablet 650 mg  650 mg Oral Q6H PRN Earney Navy, NP      . alum & mag hydroxide-simeth (MAALOX/MYLANTA) 200-200-20 MG/5ML suspension 30 mL  30 mL Oral Q4H PRN Earney Navy, NP      . chlordiazePOXIDE (LIBRIUM) capsule 25 mg  25 mg Oral QID PRN Sanjuana Kava, NP      . cyclobenzaprine (FLEXERIL) tablet 10 mg  10 mg Oral TID Craige Cotta, MD   10 mg at 03/16/16 1131  . docusate sodium (COLACE) capsule 200 mg  200 mg Oral QHS Adonis Brook, NP      . feeding supplement (ENSURE ENLIVE) (ENSURE ENLIVE) liquid 237 mL  237 mL Oral BID BM Rockey Situ Cobos, MD   237 mL at 03/16/16 1500  . gabapentin (NEURONTIN) capsule 300 mg  300 mg Oral TID Earney Navy, NP   300 mg at 03/16/16 1131  . levothyroxine (SYNTHROID, LEVOTHROID) tablet 88 mcg  88 mcg Oral QAC breakfast Earney Navy, NP   88 mcg at 03/16/16 2878  . linaclotide (LINZESS) capsule 145 mcg  145 mcg Oral QAC breakfast Craige Cotta, MD   145 mcg at 03/16/16 6767  . lubiprostone (AMITIZA) capsule 24 mcg  24 mcg Oral BID WC Adonis Brook, NP      . magnesium hydroxide (MILK OF MAGNESIA) suspension 30 mL  30 mL Oral Daily PRN Earney Navy, NP      . montelukast (SINGULAIR) tablet 10 mg  10 mg Oral Daily Earney Navy, NP   10 mg at 03/16/16 0804  . multivitamin with minerals tablet 1 tablet  1 tablet Oral Daily Earney Navy, NP   1 tablet at 03/16/16 0804  . ondansetron (ZOFRAN) tablet 4 mg  4 mg Oral Q8H PRN Earney Navy, NP   4 mg at 03/16/16 0805  . prazosin (MINIPRESS) capsule 1 mg  1 mg Oral QHS Earney Navy, NP   1 mg at 03/15/16 2123  . sertraline (ZOLOFT) tablet 200 mg  200 mg Oral QHS Earney Navy, NP   200 mg at 03/15/16 2123  . topiramate (TOPAMAX) tablet 150 mg  150 mg Oral BID Craige Cotta, MD   150 mg at 03/16/16 0804  . traZODone (DESYREL) tablet 100 mg  100 mg Oral QHS Sanjuana Kava, NP   100 mg at 03/15/16 2123    Lab Results:  Results for  orders placed or performed during the hospital encounter of 03/13/16 (from the past 48 hour(s))  Glucose, capillary     Status: None   Collection Time: 03/16/16  6:43 AM  Result Value Ref Range   Glucose-Capillary 84 65 - 99 mg/dL    Blood Alcohol level:  Lab Results  Component Value Date   ETH <5 03/12/2016    Physical Findings: AIMS: Facial and Oral Movements Muscles of Facial Expression: None, normal Lips and Perioral Area: None, normal Jaw: None,  normal Tongue: None, normal,Extremity Movements Upper (arms, wrists, hands, fingers): None, normal Lower (legs, knees, ankles, toes): None, normal, Trunk Movements Neck, shoulders, hips: None, normal, Overall Severity Severity of abnormal movements (highest score from questions above): None, normal Incapacitation due to abnormal movements: None, normal Patient's awareness of abnormal movements (rate only patient's report): No Awareness, Dental Status Current problems with teeth and/or dentures?: No Does patient usually wear dentures?: No  CIWA:  CIWA-Ar Total: 1 COWS:  COWS Total Score: 2  Musculoskeletal: Strength & Muscle Tone: within normal limits Gait & Station: normal Patient leans: N/A  Psychiatric Specialty Exam: ROS chronic headaches, but at this time no severe headache reported, no chest pain, no shortness of breath, no vomiting   Blood pressure 91/62, pulse 97, temperature 98.5 F (36.9 C), temperature source Oral, resp. rate 14, height '5\' 3"'$  (1.6 m), weight 51.71 kg (114 lb).Body mass index is 20.2 kg/(m^2).  General Appearance: Well Groomed  Engineer, water::  Good  Speech:  Normal Rate  Volume:  Normal  Mood:  less depressed, improving   Affect:  more reactive   Thought Process:  Linear  Orientation:  Other:  fully alert and attentive   Thought Content:  denies hallucinations, no delusions   Suicidal Thoughts:  No denies any suicidal ideations, denies any homicidal ideations   Homicidal Thoughts:  No  Memory:   recent and remote grossly intact   Judgement:  Other:  improving   Insight:  improving   Psychomotor Activity:  Normal  Concentration:  Good  Recall:  Good  Fund of Knowledge:Good  Language: Good  Akathisia:  Negative  Handed:  Right  AIMS (if indicated):     Assets:  Desire for Improvement Resilience  ADL's:  Intact  Cognition: WNL  Sleep:  Number of Hours: 6.75  Assessment - patient reports improvement of mood, and presents with fuller range of affect, less constricted in affect. No SI at this time. Has history of PTSD- states she had no nightmares last night and slept better on low dose Minipress . At this time not endorsing medication side effects.  Treatment Plan Summary: Daily contact with patient to assess and evaluate symptoms and progress in treatment, Medication management, Plan inpatient treatment and medications as below  Encourage ongoing milieu and group participation to work on coping skills and symptom reduction Continue Minipress 1 mgr QHS for PTSD related nightmares- patient encouraged to drink fluids in order to minimize potential orthostatic side effects  Continue Zoloft 200 mgrs QDAY for depression and PTSD Continue Neurontin 300 mgrs TID for pain and anxiety  Continue Topamax 150 mgrs BID for chronic headaches  Treatment team working on discharge planning options   Janett Labella, NP [facility] 03/16/2016, 2:35 PM

## 2016-03-16 NOTE — BHH Group Notes (Signed)
[facility] LCSW Group Therapy  03/16/2016 / 10:15 - 11:15 AM  Type of Therapy:  Group Therapy  Participation Level:  Active  Participation Quality:  Appropriate  Affect:  Appropriate  Cognitive:  Appropriate  Insight:  Engaged  Engagement in Therapy:  Engaged  Modes of Intervention:  Discussion, Exploration, Rapport Building, Socialization and Support  Summary of Progress/Problems:   Summary of Progress/Problems: The main focus of today's process group was for the patient to identify ways in which they have in the past sabotaged their own recovery. Motivational Interviewing was utilized to ask the group members what they get out of their self sabotaging behavior(s), and what reasons they may have for wanting to change. The Stages of Change were explained using a handout, and patients identified where they currently are with regard to stages of change. Patient acknowledged she needs to improve her self care. "I take care of others better than myself and would not take no for an answer from others but I do with myself." Patient made note of HALT exercise and reports that she is in preparation stager as she is willing to develop a daily schedule and post on frig at home.   Lyla Glassing

## 2016-03-17 ENCOUNTER — Encounter (HOSPITAL_COMMUNITY): Payer: Self-pay | Admitting: Registered Nurse

## 2016-03-17 MED ORDER — TOPIRAMATE 100 MG PO TABS: 100.0000 mg | ORAL_TABLET | Freq: Two times a day (BID) | ORAL | Status: DC

## 2016-03-17 MED ORDER — PRAZOSIN HCL 1 MG PO CAPS: 1.0000 mg | ORAL_CAPSULE | Freq: Every day | ORAL | Status: DC

## 2016-03-17 MED ORDER — TOPIRAMATE 100 MG PO TABS: 150.0000 mg | ORAL_TABLET | Freq: Two times a day (BID) | ORAL | Status: DC

## 2016-03-17 MED ORDER — LINACLOTIDE 145 MCG PO CAPS: 145.0000 ug | ORAL_CAPSULE | Freq: Every day | ORAL | Status: DC

## 2016-03-17 NOTE — Progress Notes (Signed)
Patient up and visible in the dayroom. Conversing with peers. Affect blunted, anxious with congruent mood though patient is pleasant and cooperative. Rates her depression and hopelessness both at a 0/10, anxiety at a 2/10. Reports her goal is to discharge today and patient feels prepared. Medicated per orders, no prn's requested or required. Emotional support provided. Praised for progress. Rechecked BP after fluids provided as patient was orthostatic this AM. VS improved (see doc flowsheets - patient does trend low). Self inventory reviewed. Denies SI/HI and remains safe on level III obs. Awaiting discharge orders.

## 2016-03-17 NOTE — Progress Notes (Signed)
Patient verbalizes readiness to discharge. Follow up plan explained, Rx's given. No belongings stored/to be returned. Patient verbalizes understanding of plan. Denies SI/HI and assures this Probation officer she will seek assistance should that change. Discharged ambulatory and in stable condition to husband.

## 2016-03-17 NOTE — Plan of Care (Signed)
Problem: Diagnosis: Increased Risk For Suicide Attempt Goal: STG-Patient Will Comply With Medication Regime Outcome: Progressing Patient was compliant wit her medications.

## 2016-03-17 NOTE — BHH Suicide Risk Assessment (Signed)
[facility] Discharge Suicide Risk Assessment   Principal Problem: Chronic post-traumatic stress disorder (PTSD) Discharge Diagnoses:  Patient Active Problem List   Diagnosis Date Noted  . Major depressive disorder, recurrent, severe without psychotic behavior ([place]) [F33.2] 03/13/2016  . PTSD (post-traumatic stress disorder) [F43.10] 03/13/2016  . Chronic post-traumatic stress disorder (PTSD) [F43.12] 03/13/2016  . Depression [F32.9]   . Suicidal ideation [R45.851]   . Ureteral calculus [N20.1] 12/26/2014  . Ureteral calculus, right [N20.1] 03/31/2013    Total Time spent with patient: 30 minutes  Musculoskeletal: Strength & Muscle Tone: within normal limits Gait & Station: normal Patient leans: N/A  Psychiatric Specialty Exam: ROS  Blood pressure 81/31, pulse 99, temperature 97.8 F (36.6 C), temperature source Oral, resp. rate 16, height 5\' 3"  (1.6 m), weight 51.71 kg (114 lb).Body mass index is 20.2 kg/(m^2).  General Appearance: Fairly Groomed  Engineer, water::  Good  Speech:  Clear and Coherent and Normal Rate409  Volume:  Normal  Mood:  Euthymic  Affect:  Congruent  Thought Process:  Intact and Linear  Orientation:  Full (Time, Place, and Person)  Thought Content:  Negative  Suicidal Thoughts:  No  Homicidal Thoughts:  No  Memory:  Immediate;   Fair Recent;   Fair Remote;   Fair  Judgement:  Intact  Insight:  Present  Psychomotor Activity:  Normal  Concentration:  Good  Recall:  Good  Fund of Knowledge:Good  Language: Good  Akathisia:  No  Handed:  Right  AIMS (if indicated):     Assets:  Communication Skills Desire for Improvement Housing  Sleep:  Number of Hours: 6  Cognition: WNL  ADL's:  Intact   Mental Status Per Nursing Assessment::   On Admission:     Demographic Factors:  Caucasian, Unemployed and married, living with husband  Loss Factors: Decrease in vocational status and Decline in physical health  Historical Factors: NA  Risk Reduction  Factors:   Responsible for children under 30 years of age, Sense of responsibility to family, Living with another person, especially a relative and Positive social support  Continued Clinical Symptoms:  Pt states she is excited to be d/c home today. She plans to take her meds and make a routine for herself.  States she is doing better and depression is 0/10. Denies irritability and anxiety. Sleep, appetite and energy are good. Denies SI/HI/AVH. Reports dizziness from Minipress so have d/c it. Pt wants to join IOP after d/c.   Cognitive Features That Contribute To Risk:  None    Suicide Risk:  Minimal: No identifiable suicidal ideation.  Patients presenting with no risk factors but with morbid ruminations; may be classified as minimal risk based on the severity of the depressive symptoms  Follow-up Information    Follow up with [facility].   Why:  Referral made on 4/28. Social worker will call you within 2-3 business days of discharge with appointment information for therapy and medication management.    Contact information:   [place],  [id number]      Follow up with [facility] Intensive Outpatient Program.   Why:  Social Worker will call you at [phone] with a start date for Intensive Outpatient Program.  To reach your social worker Erasmo Downer, call [phone].   Contact information:   [place]  [address] [phone]      Plan Of Care/Follow-up recommendations:  Activity:  as tolerated. Diet:  resume normal diet Other:  take meds as prescribed.  f/up with doctors. Avoid illicit drugs and alcohol.  Charlcie Cradle, MD 03/17/2016, 8:53 AM

## 2016-03-17 NOTE — Progress Notes (Signed)
  [facility] Adult Case Management Discharge Plan :  Will you be returning to the same living situation after discharge:  Yes,  returning home with husband At discharge, do you have transportation home?: Yes,  has arranged Do you have the ability to pay for your medications: Yes,  will get prescriptions at d/c  Release of information consent forms completed and in the chart;  Patient's signature needed at discharge.  Patient to Follow up at: Follow-up Information    Follow up with [facility].   Why:  Referral made on 4/28. Social worker will call you within 2-3 business days of discharge with appointment information for therapy and medication management.    Contact information:   [place], [place] [address]      Follow up with [facility] Intensive Outpatient Program.   Why:  Social Worker will call you at [phone] with a start date for Intensive Outpatient Program.  To reach your social worker Erasmo Downer, call [phone].   Contact information:   [place]  [phone]      Next level of care provider has access to [place] and Suicide Prevention discussed: Yes,  with patient and her husband  Have you used any form of tobacco in the last 30 days? (Cigarettes, Smokeless Tobacco, Cigars, and/or Pipes): No  Has patient been referred to the Quitline?: N/A patient is not a smoker  Patient has been referred for addiction treatment: N/A  Lysle Dingwall 03/17/2016, 1:23 PM

## 2016-03-17 NOTE — Discharge Summary (Signed)
Physician Discharge Summary Note  Patient:  Morgan [name] is an 45 y.o., female MRN:  [id number] DOB:  [birth year] Patient phone:  There is no home phone number on file.  Patient address:   [address] [place] [id number],  Total Time spent with patient: 30 minutes  Date of Admission:  03/13/2016 Date of Discharge: 03/17/2016  Reason for Admission:  Worsening depression and complaints of confusion and nightmares  Principal Problem: Chronic post-traumatic stress disorder (PTSD) Discharge Diagnoses: Patient Active Problem List   Diagnosis Date Noted  . Major depressive disorder, recurrent, severe without psychotic behavior ([place]) [F33.2] 03/13/2016  . PTSD (post-traumatic stress disorder) [F43.10] 03/13/2016  . Chronic post-traumatic stress disorder (PTSD) [F43.12] 03/13/2016  . Depression [F32.9]   . Suicidal ideation [R45.851]   . Ureteral calculus [N20.1] 12/26/2014  . Ureteral calculus, right [N20.1] 03/31/2013    Past Psychiatric History: Major depressive disorder; Yes (Sertraline, Valium, Trazodone)  Past Medical History:  Past Medical History  Diagnosis Date  . Right ureteral stone   . RLS (restless legs syndrome)   . Acid reflux   . Hematuria   . Renal calculi BILATERAL  . History of kidney stones   . PONV (postoperative nausea and vomiting)   . Hypothyroidism   . H. pylori infection     hx of , none now  . Family history of adverse reaction to anesthesia     sister has PONV  . Headache     migraines, uses imitrex as needed    Past Surgical History  Procedure Laterality Date  . Laparoscopic assisted vaginal hysterectomy  MARCH 2014    W/ BILATERAL SALPINGOOPHORECTOMY  . Cholecystectomy    . Laparoscopic gastric banding  SEPT 2011  . Tonsillectomy and adenoidectomy  AGE 54'S  . Ureterolithotomy  NOV 2013  . Cystoscopy/retrograde/ureteroscopy Right 03/31/2013    Procedure: CYSTOSCOPY/RETROGRADE/URETEROSCOPY;  Surgeon: Bernestine Amass, MD;  Location:  [facility] ;  Service: Urology;  Laterality: Right;  . Holmium laser application Right [id number]    Procedure: HOLMIUM LASER APPLICATION;  Surgeon: Bernestine Amass, MD;  Location: [facility];  Service: Urology;  Laterality: Right;  . Gastric bypass  03/15/14  . Abdominal hysterectomy    . Cystoscopy with retrograde pyelogram, ureteroscopy and stent placement Left 12/26/2014    Procedure: CYSTOSCOPY WITH RETROGRADE PYELOGRAM, URETEROSCOPY AND STENT PLACEMENT;  Surgeon: Bernestine Amass, MD;  Location: [facility];  Service: Urology;  Laterality: Left;  . Holmium laser application Left [id number]    Procedure: HOLMIUM LASER APPLICATION;  Surgeon: Bernestine Amass, MD;  Location: [facility];  Service: Urology;  Laterality: Left;   Family History: History reviewed. No pertinent family history. Family Psychiatric  History: Denies any familial Hx of mental illness. Social History:  History  Alcohol Use No     History  Drug Use No    Social History   Social History  . Marital Status: Married    Spouse Name: N/A  . Number of Children: N/A  . Years of Education: N/A   Social History Main Topics  . Smoking status: Never Smoker   . Smokeless tobacco: Never Used  . Alcohol Use: No  . Drug Use: No  . Sexual Activity: Not Asked   Other Topics Concern  . None   Social History Narrative    Hospital Course:  TASHIA SHELDEN was admitted for Chronic post-traumatic stress disorder (PTSD) and crisis management.  She was treated  with the following medications: Her Sertraline was resumed at 200 mg daily for Major Depression; Started Trazodone 100 mg at bedtime for Insomnia; Minipress 1 mg Q hs for nightmares; Gabapentin 300 mg for agitation; Continued her Topamax 150 mg twice daily for headache.  Prior to discharge Minipress was discontinued related to low blood pressure 100-90/60-39 and complaints of dizziness.  She was also informed that  Topamax may be the cause of her confusion; Patient wanted to continue Topamax; informed patient that she should address with her neurologist; understanding voiced.  Morgan [name] was discharged with current medication and was instructed on how to take medications as prescribed; (details listed below under Medication List).  Medical problems were identified and treated as needed.  Home medications were restarted as appropriate.  Improvement was monitored by observation and Morgan [name] daily report of symptom reduction.  Emotional and mental status was monitored by daily self-inventory reports completed by Morgan [name] and clinical staff.         Morgan [name] was evaluated by the treatment team for stability and plans for continued recovery upon discharge.  Morgan [name] motivation was an integral factor for scheduling further treatment.  Employment, transportation, bed availability, health status, family support, and any pending legal issues were also considered during her hospital stay.  She was offered further treatment options upon discharge including but not limited to Residential, Intensive Outpatient, and Outpatient treatment.  Morgan [name] will follow up with the services as listed below under Follow Up Information.     Upon completion of this admission the Morgan [name] was both mentally and medically stable for discharge denying suicidal/homicidal ideation, auditory/visual/tactile hallucinations, delusional thoughts and paranoia.       Physical Findings: AIMS: Facial and Oral Movements Muscles of Facial Expression: None, normal Lips and Perioral Area: None, normal Jaw: None, normal Tongue: None, normal,Extremity Movements Upper (arms, wrists, hands, fingers): None, normal Lower (legs, knees, ankles, toes): None, normal, Trunk Movements Neck, shoulders, hips: None, normal, Overall Severity Severity of abnormal movements (highest score from questions above):  None, normal Incapacitation due to abnormal movements: None, normal Patient's awareness of abnormal movements (rate only patient's report): No Awareness, Dental Status Current problems with teeth and/or dentures?: No Does patient usually wear dentures?: No  CIWA:  CIWA-Ar Total: 1 COWS:  COWS Total Score: 2  Musculoskeletal: Strength & Muscle Tone: within normal limits Gait & Station: normal Patient leans: N/A  Psychiatric Specialty Exam: See Suicide Risk Assessment ROS  Blood pressure 85/51, pulse 125, temperature 97.8 F (36.6 C), temperature source Oral, resp. rate 16, height 5\' 3"  (1.6 m), weight 51.71 kg (114 lb).Body mass index is 20.2 kg/(m^2).  Have you used any form of tobacco in the last 30 days? (Cigarettes, Smokeless Tobacco, Cigars, and/or Pipes): No  Has this patient used any form of tobacco in the last 30 days? (Cigarettes, Smokeless Tobacco, Cigars, and/or Pipes) Yes, No  Blood Alcohol level:  Lab Results  Component Value Date   ETH <5 [id number]    Metabolic Disorder Labs:  No results found for: HGBA1C, MPG No results found for: PROLACTIN No results found for: CHOL, TRIG, HDL, CHOLHDL, VLDL, LDLCALC  See Psychiatric Specialty Exam and Suicide Risk Assessment completed by Attending Physician prior to discharge.  Discharge destination:  Home  Is patient on multiple antipsychotic therapies at discharge:  No   Has Patient had three or more failed trials of antipsychotic monotherapy by history:  No  Recommended  Plan for Multiple Antipsychotic Therapies: NA      Discharge Instructions    Activity as tolerated - No restrictions    Complete by:  As directed      Diet general    Complete by:  As directed      Discharge instructions    Complete by:  As directed   Take all of you medications as prescribed by your mental healthcare provider.  Report any adverse effects and reactions from your medications to your outpatient provider promptly.  Do not engage in  alcohol and or illegal drug use while on prescription medicines. Keep all scheduled appointments. This is to ensure that you are getting refills on time and to avoid any interruption in your medication.  If you are unable to keep an appointment call to reschedule.  Be sure to follow up with resources and follow ups given. In the event of worsening symptoms call the crisis hotline, 911, and or go to the nearest emergency department for appropriate evaluation and treatment of symptoms. Follow-up with your primary care provider for your medical issues, concerns and or health care needs.            Medication List    STOP taking these medications        diazepam 5 MG tablet  Commonly known as:  VALIUM     RA MELATONIN 10 MG Tabs  Generic drug:  Melatonin     URIBEL 118 MG Caps      TAKE these medications      Indication   cyclobenzaprine 10 MG tablet  Commonly known as:  FLEXERIL  Take 10 mg by mouth 3 (three) times daily as needed for muscle spasms.      DUONEB 0.5-2.5 (3) MG/3ML Soln  Generic drug:  ipratropium-albuterol  Inhale 3 mLs into the lungs every 6 (six) hours as needed (SOB, wheezing).      gabapentin 300 MG capsule  Commonly known as:  NEURONTIN  Take 300 mg by mouth 3 (three) times daily.      LEVOTHROID 88 MCG tablet  Generic drug:  levothyroxine  Take 88 mcg by mouth daily before breakfast.      linaclotide 145 MCG Caps capsule  Commonly known as:  LINZESS  Take 1 capsule (145 mcg total) by mouth daily before breakfast.   Indication:  Constipation caused by Irritable Bowel Syndrome     montelukast 10 MG tablet  Commonly known as:  SINGULAIR  Take 10 mg by mouth daily.      multivitamin with minerals Tabs tablet  Take 1 tablet by mouth daily.      sertraline 100 MG tablet  Commonly known as:  ZOLOFT  Take 200 mg by mouth at bedtime.      topiramate 50 MG tablet  Commonly known as:  TOPAMAX  Take 50 mg by mouth 2 (two) times daily.      topiramate  100 MG tablet  Commonly known as:  TOPAMAX  Take 1 tablet (100 mg total) by mouth 2 (two) times daily.      traMADol 50 MG tablet  Commonly known as:  ULTRAM  Take 50 mg by mouth every 6 (six) hours as needed for moderate pain (kidney stones).      traZODone 50 MG tablet  Commonly known as:  DESYREL  Take 100 mg by mouth at bedtime.      ZOFRAN ODT 4 MG disintegrating tablet  Generic drug:  ondansetron  Take 4 mg  by mouth every 8 (eight) hours as needed for nausea.        Follow-up Information    Follow up with [facility].   Why:  Referral made on 4/28. Social worker will call you within 2-3 business days of discharge with appointment information for therapy and medication management.    Contact information:   [place], [place] [address]      Follow up with [facility] Intensive Outpatient Program.   Why:  Social Worker will call you at [phone] with a start date for Intensive Outpatient Program.  To reach your social worker Erasmo Downer, call [phone].   Contact information:   [place]  [address] [phone]      Follow-up recommendations:  Activity:  As tolerated Diet:  As tolerated  Comments:  Morgan [name] has been instructed to take medications as prescribed; and report adverse effects to outpatient provider.  Follow up with primary doctor for any medical issues and If symptoms recur report to nearest emergency or crisis hot line.    SignedEarleen Newport, NP 03/17/2016, 9:30 AM

## 2016-03-17 NOTE — Progress Notes (Deleted)
Writer spoke with patient and she reports that her day has been better although she was irritable this morning and was aware that she was rude upon her admission and this morning. She reports she had a bad experience at the ED before coming here and was still irritable. She reports feeling better and is hopeful to discharge soon. She attended group and participated. Support given and safety maintained on unit with 15 min checks.

## 2016-03-17 NOTE — [facility] Group Notes (Signed)
[facility] LCSW Group Therapy  03/17/2016 10:00 AM  Type of Therapy:  Group Therapy  Participation Level:  Minimal  Participation Quality:  Appropriate and Attentive  Affect:  Appropriate  Cognitive:  Alert, Appropriate and Oriented  Insight:  None  Engagement in Therapy:  Limited  Modes of Intervention:  Discussion  Summary of Progress/Problems: Participants discussed challenges of poor self-esteem and asked questions to gain better understanding of the issue. Group discussion processed roots of self-esteem in order to identify a plan to work on self-esteem. Identify self-esteem and how it is influenced of others. Acknowledge the importance of being honest with yourself about feelings and self-worth.  Identify supports that will affirm esteem. Identify activities for acceptance of self. Also, began to discuss the process of loss in relationships during the search for additional supports. Patient did not participate in much conversation with group but did visually affirm peer comments and presented as engaged with group topic.   Christene Lye 03/17/2016, 3:45 PM

## 2016-03-17 NOTE — Progress Notes (Signed)
Patient has been up and active on the unit, attended group this evening and has voiced no complaints. She is hopeful to discharge on tomorrow. Patient currently denies si/hi/a/v hall. Support and encouragement offered, safety maintained on unit, will continue to monitor.

## 2016-03-18 ENCOUNTER — Ambulatory Visit: Payer: Self-pay | Admitting: Cardiology

## 2016-04-09 ENCOUNTER — Encounter: Payer: Self-pay | Admitting: Cardiology

## 2016-04-12 ENCOUNTER — Encounter: Payer: Self-pay | Admitting: *Deleted

## 2016-04-12 ENCOUNTER — Encounter: Payer: Self-pay | Admitting: Cardiology

## 2016-04-12 ENCOUNTER — Ambulatory Visit (INDEPENDENT_AMBULATORY_CARE_PROVIDER_SITE_OTHER): Payer: BC Managed Care – PPO | Admitting: Cardiology

## 2016-04-12 VITALS — BP 100/68 | HR 81 | Ht 63.0 in | Wt 120.4 lb

## 2016-04-12 DIAGNOSIS — R55 Syncope and collapse: Secondary | ICD-10-CM

## 2016-04-12 DIAGNOSIS — I951 Orthostatic hypotension: Secondary | ICD-10-CM | POA: Diagnosis not present

## 2016-04-12 NOTE — Patient Instructions (Signed)
Medication Instructions:  The current medical regimen is effective;  continue present plan and medications.  Testing/Procedures: Your physician has requested that you have an echocardiogram. Echocardiography is a painless test that uses sound waves to create images of your heart. It provides your doctor with information about the size and shape of your heart and how well your heart's chambers and valves are working. This procedure takes approximately one hour. There are no restrictions for this procedure.  Follow-Up: Follow up as needed with Dr Skains.  If you need a refill on your cardiac medications before your next appointment, please call your pharmacy.  Thank you for choosing [facility]!!     

## 2016-04-12 NOTE — Progress Notes (Signed)
Cardiology Office Note    Date:  04/12/2016   ID:  Morgan [name], DOB 1971/11/13, MRN [id number]  PCP:  Morgan Morning, DO  Cardiologist:   Dr. Bettina [name]     History of Present Illness:  Morgan [name] is a 45 y.o. female here for evaluation of lightheadedness she had seen a previous cardiologist regarding cardiac evaluation for chest discomfort. I will review records from Dr. Catalina [name] of headache and neck pain clinic. She was hospitalized at behavioral health for suicidal ideation, depression. She was advised to see a psychiatrist as well as psychologist. She's been working with her attorney for [employer]. Anxious, panic attacks. Headaches. Forgetful. Occasionally will feel lightheaded. Extensive lab work including CBC, basic metabolic profile, liver function studies were unremarkable other than mild elevation of liver enzymes. When I reviewed liver functions from 03/12/16, they were all normal.  Has seen [facility] in [place]. Dr. Bettina [name]. She reports having cardiac catheterization that was reportedly normal. I reviewed echocardiogram from February 2015 that was normal.  Since her concussion a year ago, she has been forgetful.She has not had any other episodes of syncope. No recent chest pain episode. No significant shortness of breath.  She is concerned because of a family history of heart disease that she may have a heart attack in the future. We discussed at length.  Past Medical History  Diagnosis Date  . Right ureteral stone   . RLS (restless legs syndrome)   . Acid reflux   . Hematuria   . Renal calculi BILATERAL  . History of kidney stones   . PONV (postoperative nausea and vomiting)   . Hypothyroidism   . H. pylori infection     hx of , none now  . Family history of adverse reaction to anesthesia     sister has PONV  . Headache     migraines, uses imitrex as needed    Past Surgical History  Procedure Laterality Date  . Laparoscopic  assisted vaginal hysterectomy  MARCH 2014    W/ BILATERAL SALPINGOOPHORECTOMY  . Cholecystectomy    . Laparoscopic gastric banding  SEPT 2011  . Tonsillectomy and adenoidectomy  AGE 37'S  . Ureterolithotomy  NOV 2013  . Cystoscopy/retrograde/ureteroscopy Right 03/31/2013    Procedure: CYSTOSCOPY/RETROGRADE/URETEROSCOPY;  Surgeon: Bernestine Amass, MD;  Location: [facility];  Service: Urology;  Laterality: Right;  . Holmium laser application Right [id number]    Procedure: HOLMIUM LASER APPLICATION;  Surgeon: Bernestine Amass, MD;  [facility]: [facility];  Service: Urology;  Laterality: Right;  . Gastric bypass  03/15/14  . Abdominal hysterectomy    . Cystoscopy with retrograde pyelogram, ureteroscopy and stent placement Left 12/26/2014    Procedure: CYSTOSCOPY WITH RETROGRADE PYELOGRAM, URETEROSCOPY AND STENT PLACEMENT;  Surgeon: Bernestine Amass, MD;  Location: [facility];  Service: Urology;  Laterality: Left;  . Holmium laser application Left [id number]    Procedure: HOLMIUM LASER APPLICATION;  Surgeon: Bernestine Amass, MD;  Location: [facility];  Service: Urology;  Laterality: Left;    Current Medications: Outpatient Prescriptions Prior to Visit  Medication Sig Dispense Refill  . cyclobenzaprine (FLEXERIL) 10 MG tablet Take 10 mg by mouth 3 (three) times daily as needed for muscle spasms.    Marland Kitchen gabapentin (NEURONTIN) 300 MG capsule Take 300 mg by mouth 3 (three) times daily.     Marland Kitchen ipratropium-albuterol (DUONEB) 0.5-2.5 (3) MG/3ML SOLN Inhale 3 mLs into the lungs every 6 (  six) hours as needed (SOB, wheezing).     Marland Kitchen levothyroxine (LEVOTHROID) 88 MCG tablet Take 88 mcg by mouth daily before breakfast.    . montelukast (SINGULAIR) 10 MG tablet Take 10 mg by mouth daily.    . Multiple Vitamin (MULTIVITAMIN WITH MINERALS) TABS tablet Take 1 tablet by mouth daily.    . ondansetron (ZOFRAN ODT) 4 MG disintegrating tablet Take 4 mg by mouth every 8  (eight) hours as needed for nausea.     . sertraline (ZOLOFT) 100 MG tablet Take 200 mg by mouth at bedtime.     . topiramate (TOPAMAX) 100 MG tablet Take 1 tablet (100 mg total) by mouth 2 (two) times daily. 45 tablet 0  . topiramate (TOPAMAX) 50 MG tablet Take 50 mg by mouth 2 (two) times daily.     . traMADol (ULTRAM) 50 MG tablet Take 50 mg by mouth every 6 (six) hours as needed for moderate pain (kidney stones).    . traZODone (DESYREL) 50 MG tablet Take 100 mg by mouth at bedtime.     Marland Kitchen linaclotide (LINZESS) 145 MCG CAPS capsule Take 1 capsule (145 mcg total) by mouth daily before breakfast. 30 capsule 0   No facility-administered medications prior to visit.     Allergies:   Clindamycin/lincomycin; Nsaids; and Oxycodone   Social History   Social History  . Marital Status: Married    Spouse Name: N/A  . Number of Children: N/A  . Years of Education: N/A   Social History Main Topics  . Smoking status: Never Smoker   . Smokeless tobacco: Never Used  . Alcohol Use: No  . Drug Use: No  . Sexual Activity: Not Asked   Other Topics Concern  . None   Social History Narrative     Family History:  The patient's family history includes COPD in her father; Cancer in her mother; Deafness in her father; Diabetes in her mother; Emphysema in her father; Hypertension in her father and mother.   ROS:   Please see the history of present illness.   Depression, anxiety, dizziness ROS All other systems reviewed and are negative.   PHYSICAL EXAM:   VS:  BP 100/68 mmHg  Pulse 81  Ht 5\' 3"  (1.6 m)  Wt 120 lb 6.4 oz (54.613 kg)  BMI 21.33 kg/m2   GEN: Thin, in no acute distress HEENT: normal Neck: no JVD, carotid bruits, or masses Cardiac: RRR; no murmurs, rubs, or gallops,no edema  Respiratory:  clear to auscultation bilaterally, normal work of breathing GI: soft, nontender, nondistended, + BS MS: no deformity or atrophy Skin: warm and dry, no rash Neuro:  Alert and Oriented x 3,  Strength and sensation are intact Psych: depressed mood, flat affect  Wt Readings from Last 3 Encounters:  04/12/16 120 lb 6.4 oz (54.613 kg)  03/13/16 114 lb (51.71 kg)  12/26/14 164 lb (74.39 kg)      Studies/Labs Reviewed:   EKG:  EKG is ordered today.  The ekg ordered today demonstrates 04/12/16 normal rhythm 81 bpm, vertical axis, normal intervals, no other abnormality.  Recent Labs: 03/12/2016: ALT 40; BUN 11; Creatinine, Ser 0.98; Hemoglobin 13.2; Platelets 185; Potassium 4.1; Sodium 141   Lipid Panel No results found for: CHOL, TRIG, HDL, CHOLHDL, VLDL, LDLCALC, LDLDIRECT  Additional studies/ records that were reviewed today include:  Prior office records, lab work, cardiac studies reviewed    ASSESSMENT:    1. Near syncope   2. Orthostatic hypotension  PLAN:  In order of problems listed above:  Lightheadedness/near syncope  - This is secondary to her hypotension, low blood pressure. She does demonstrate decreased blood pressure when standing down into the upper [id number] systolic. I agree with Dr. Bettina [name], aggressive hydration, saw liberalization. No further cardiac interventions needed. She does note that Dr. Bettina [name] performed a radial artery cardiac catheterization. This was reported as normal. She wanted me to review. I will have her sign a medical release form. We will review.  - She reports only one episode of true syncope last year which resulted in concussion.  - EKG is reassuring. Previous cardiac workup unremarkable.  - Of course, some of her medications can cause hypotension.  - If this is not are ready been done, her primary care physician may wish to test her for Addison's/adrenal insufficiency which can also cause chronic hypotension.  - I reviewed ER notes from original syncopal episode, the thought was that she had vasovagal syncope. No further cardiac workup other than repeat echocardiogram to ensure proper structure and function. Prior echocardiogram  reviewed from 2015 February showed normal EF, normal function.        Medication Adjustments/Labs and Tests Ordered: Current medicines are reviewed at length with the patient today.  Concerns regarding medicines are outlined above.  Medication changes, Labs and Tests ordered today are listed in the Patient Instructions below. Patient Instructions  Medication Instructions:  The current medical regimen is effective;  continue present plan and medications.  Testing/Procedures: Your physician has requested that you have an echocardiogram. Echocardiography is a painless test that uses sound waves to create images of your heart. It provides your doctor with information about the size and shape of your heart and how well your heart's chambers and valves are working. This procedure takes approximately one hour. There are no restrictions for this procedure.  Follow-Up: Follow up as needed with Dr Marlou Porch.  If you need a refill on your cardiac medications before your next appointment, please call your pharmacy.  Thank you for choosing [facility]!!           Signed, Candee Furbish, MD  04/12/2016 3:55 PM    [address], [place], [place]  [address] Phone: [phone]; Fax: [phone]

## 2016-05-03 ENCOUNTER — Ambulatory Visit (HOSPITAL_COMMUNITY): Payer: Self-pay

## 2016-05-23 ENCOUNTER — Ambulatory Visit (HOSPITAL_COMMUNITY): Payer: Self-pay

## 2016-06-07 ENCOUNTER — Other Ambulatory Visit (HOSPITAL_COMMUNITY): Payer: Self-pay

## 2016-07-16 ENCOUNTER — Ambulatory Visit (HOSPITAL_COMMUNITY): Payer: BC Managed Care – PPO | Attending: Cardiology

## 2016-07-16 ENCOUNTER — Other Ambulatory Visit: Payer: Self-pay

## 2016-07-16 DIAGNOSIS — R55 Syncope and collapse: Secondary | ICD-10-CM | POA: Diagnosis not present

## 2016-07-16 DIAGNOSIS — I313 Pericardial effusion (noninflammatory): Secondary | ICD-10-CM | POA: Insufficient documentation

## 2016-08-11 ENCOUNTER — Encounter (HOSPITAL_COMMUNITY): Payer: Self-pay | Admitting: Emergency Medicine

## 2016-08-11 ENCOUNTER — Emergency Department (HOSPITAL_COMMUNITY)
Admission: EM | Admit: 2016-08-11 | Discharge: 2016-08-11 | Disposition: A | Discharge status: Home / Self Care | Payer: BC Managed Care – PPO | Attending: Emergency Medicine | Admitting: Emergency Medicine

## 2016-08-11 ENCOUNTER — Emergency Department (HOSPITAL_COMMUNITY): Payer: BC Managed Care – PPO

## 2016-08-11 DIAGNOSIS — E039 Hypothyroidism, unspecified: Secondary | ICD-10-CM | POA: Insufficient documentation

## 2016-08-11 DIAGNOSIS — Z79899 Other long term (current) drug therapy: Secondary | ICD-10-CM | POA: Insufficient documentation

## 2016-08-11 DIAGNOSIS — R1031 Right lower quadrant pain: Secondary | ICD-10-CM | POA: Diagnosis not present

## 2016-08-11 HISTORY — DX: Unspecified intracranial injury with loss of consciousness of unspecified duration, initial encounter: S06.9X9A

## 2016-08-11 LAB — URINALYSIS, ROUTINE W REFLEX MICROSCOPIC
BILIRUBIN URINE: NEGATIVE
Glucose, UA: NEGATIVE mg/dL
Hgb urine dipstick: NEGATIVE
KETONES UR: NEGATIVE mg/dL
NITRITE: NEGATIVE
PROTEIN: NEGATIVE mg/dL
SPECIFIC GRAVITY, URINE: 1.012 (ref 1.005–1.030)
pH: 5.5 (ref 5.0–8.0)

## 2016-08-11 LAB — COMPREHENSIVE METABOLIC PANEL
ALBUMIN: 4.3 g/dL (ref 3.5–5.0)
ALK PHOS: 93 U/L (ref 38–126)
ALT: 33 U/L (ref 14–54)
ANION GAP: 7 (ref 5–15)
AST: 30 U/L (ref 15–41)
BILIRUBIN TOTAL: 0.7 mg/dL (ref 0.3–1.2)
BUN: 17 mg/dL (ref 6–20)
CALCIUM: 9.1 mg/dL (ref 8.9–10.3)
CO2: 24 mmol/L (ref 22–32)
CREATININE: 0.8 mg/dL (ref 0.44–1.00)
Chloride: 108 mmol/L (ref 101–111)
GFR calc non Af Amer: >60 mL/min (ref >60)
GLUCOSE: 95 mg/dL (ref 65–99)
Potassium: 4.2 mmol/L (ref 3.5–5.1)
Sodium: 139 mmol/L (ref 135–145)
TOTAL PROTEIN: 7.3 g/dL (ref 6.5–8.1)

## 2016-08-11 LAB — CBC
HCT: 39.8 % (ref 36.0–46.0)
HEMOGLOBIN: 12.8 g/dL (ref 12.0–15.0)
MCH: 27.5 pg (ref 26.0–34.0)
MCHC: 32.2 g/dL (ref 30.0–36.0)
MCV: 85.4 fL (ref 78.0–100.0)
PLATELETS: 187 10*3/uL (ref 150–400)
RBC: 4.66 MIL/uL (ref 3.87–5.11)
RDW: 13.4 % (ref 11.5–15.5)
WBC: 5.1 10*3/uL (ref 4.0–10.5)

## 2016-08-11 LAB — URINE MICROSCOPIC-ADD ON
Bacteria, UA: NONE SEEN
RBC / HPF: NONE SEEN RBC/hpf (ref 0–5)

## 2016-08-11 LAB — LIPASE, BLOOD: Lipase: 33 U/L (ref 11–51)

## 2016-08-11 MED ORDER — IOPAMIDOL (ISOVUE-300) INJECTION 61%
100.0000 mL | Freq: Once | INTRAVENOUS | Status: AC | PRN
  Administered 2016-08-11: 100 mL via INTRAVENOUS

## 2016-08-11 MED ORDER — IOPAMIDOL (ISOVUE-300) INJECTION 61%
15.0000 mL | Freq: Once | INTRAVENOUS | Status: AC | PRN
  Administered 2016-08-11: 15 mL via ORAL

## 2016-08-11 NOTE — ED Notes (Signed)
Patient transported to X-ray 

## 2016-08-11 NOTE — ED Triage Notes (Signed)
Pt reports she has not had a bowel movement in 2 weeks. Has had generalized abd pain for the past week. Has some liquid from rectum intermittently, but no stool in liquid. Pt tried miralax, lactulose, amitiza, and linzess prescribed by doctor at [facility] with no relief. No dysuria.

## 2016-08-11 NOTE — ED Provider Notes (Signed)
[facility] DEPT Provider Note   CSN: [id number] Arrival date & time: 08/11/16  1017     History   Chief Complaint Chief Complaint  Patient presents with  . Abdominal Pain    HPI Morgan [name] is a 45 y.o. female.  HPI Patient states that she has had constipation. She has not had a bowel movement in 2 weeks. She reports she is getting generalized abdominal pain is cramping in nature. It is worse in the right lower quadrant. She has had this problem in the past. She reports that sometime she had gone up to a month or more without having a bowel movement. She reports she sees a gastroenterologist at [facility]. She now takes [place] and started having regular bowel movements. She reports this episode however is now going on 2 weeks. She is also tried MiraLAX and lactulose without relief. No pain burning or urgency. Medication. No back pain. No vomiting or. No fever no chills. Past Medical History:  Diagnosis Date  . Acid reflux   . Family history of adverse reaction to anesthesia    sister has PONV  . H. pylori infection    hx of , none now  . Headache    migraines, uses imitrex as needed  . Hematuria   . History of kidney stones   . Hypothyroidism   . PONV (postoperative nausea and vomiting)   . Renal calculi BILATERAL  . Right ureteral stone   . RLS (restless legs syndrome)   . TBI (traumatic brain injury) ([place]) 04/12/2015    Patient Active Problem List   Diagnosis Date Noted  . Major depressive disorder, recurrent, severe without psychotic behavior ([place]) 03/13/2016  . PTSD (post-traumatic stress disorder) 03/13/2016  . Chronic post-traumatic stress disorder (PTSD) 03/13/2016  . Depression   . Suicidal ideation   . Ureteral calculus 12/26/2014  . Ureteral calculus, right 03/31/2013    Past Surgical History:  Procedure Laterality Date  . ABDOMINAL HYSTERECTOMY    . CHOLECYSTECTOMY    . CYSTOSCOPY WITH RETROGRADE PYELOGRAM, URETEROSCOPY AND  STENT PLACEMENT Left 12/26/2014   Procedure: CYSTOSCOPY WITH RETROGRADE PYELOGRAM, URETEROSCOPY AND STENT PLACEMENT;  Surgeon: Bernestine Amass, MD;  Location: [facility];  Service: Urology;  Laterality: Left;  . CYSTOSCOPY/RETROGRADE/URETEROSCOPY Right 03/31/2013   Procedure: CYSTOSCOPY/RETROGRADE/URETEROSCOPY;  Surgeon: Bernestine Amass, MD;  Location: [facility];  Service: Urology;  Laterality: Right;  . 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  . HOLMIUM LASER APPLICATION Right [id number]   Procedure: HOLMIUM LASER APPLICATION;  Surgeon: Bernestine Amass, MD;  Location: [facility];  Service: Urology;  Laterality: Right;  . HOLMIUM LASER APPLICATION Left [id number]   Procedure: HOLMIUM LASER APPLICATION;  Surgeon: Bernestine Amass, MD;  Location: [facility];  Service: Urology;  Laterality: Left;  . LAPAROSCOPIC ASSISTED VAGINAL HYSTERECTOMY  MARCH 2014   W/ BILATERAL SALPINGOOPHORECTOMY  . LAPAROSCOPIC GASTRIC BANDING  SEPT 2011  . TONSILLECTOMY AND ADENOIDECTOMY  AGE 37'S  . URETEROLITHOTOMY  NOV 2013    OB History    No data available       Home Medications    Prior to Admission medications   Medication Sig Start Date End Date Taking? Authorizing Provider  cyclobenzaprine (FLEXERIL) 10 MG tablet Take 10 mg by mouth 3 (three) times daily as needed for muscle spasms.   Yes Historical Provider, MD  gabapentin (NEURONTIN) 300 MG capsule Take 300 mg by mouth 3 (three) times daily.  09/06/14  Yes Historical Provider, MD  levothyroxine (LEVOTHROID) 88 MCG tablet Take 88 mcg by mouth daily before breakfast.   Yes Historical Provider, MD  linaclotide (LINZESS) 290 MCG CAPS capsule Take 290 mcg by mouth daily before breakfast.   Yes Historical Provider, MD  montelukast (SINGULAIR) 10 MG tablet Take 10 mg by mouth at bedtime.  02/05/16  Yes Historical Provider, MD  Multiple Vitamin (MULTIVITAMIN WITH MINERALS) TABS tablet Take 1 tablet by mouth daily.   Yes  Historical Provider, MD  sertraline (ZOLOFT) 100 MG tablet Take 200 mg by mouth at bedtime.    Yes Historical Provider, MD  topiramate (TOPAMAX) 100 MG tablet Take 1 tablet (100 mg total) by mouth 2 (two) times daily. 03/17/16  Yes Shuvon B Rankin, NP  topiramate (TOPAMAX) 50 MG tablet Take 50 mg by mouth 2 (two) times daily.    Yes Historical Provider, MD  traZODone (DESYREL) 50 MG tablet Take 100 mg by mouth at bedtime.    Yes Historical Provider, MD  ipratropium-albuterol (DUONEB) 0.5-2.5 (3) MG/3ML SOLN Inhale 3 mLs into the lungs every 6 (six) hours as needed (SOB, wheezing).     Historical Provider, MD  ondansetron (ZOFRAN ODT) 4 MG disintegrating tablet Take 4 mg by mouth every 8 (eight) hours as needed for nausea.  02/05/16   Historical Provider, MD  traMADol (ULTRAM) 50 MG tablet Take 50 mg by mouth every 6 (six) hours as needed for moderate pain (kidney stones).    Historical Provider, MD    Family History Family History  Problem Relation Age of Onset  . Diabetes Mother   . Hypertension Mother   . Cancer Mother   . Hypertension Father   . COPD Father   . Deafness Father   . Emphysema Father     Social History Social History  Substance Use Topics  . Smoking status: Never Smoker  . Smokeless tobacco: Never Used  . Alcohol use No     Allergies   Clindamycin/lincomycin; Nsaids; and Oxycodone   Review of Systems Review of Systems 10 Systems reviewed and are negative for acute change except as noted in the HPI.   Physical Exam Updated Vital Signs BP 101/69   Pulse 76   Temp 97.6 F (36.4 C) (Oral)   Resp 16   SpO2 100%   Physical Exam  Constitutional: She is oriented to person, place, and time. She appears well-developed and well-nourished. No distress.  HENT:  Head: Normocephalic and atraumatic.  Eyes: Conjunctivae are normal.  Neck: Neck supple.  Cardiovascular: Normal rate and regular rhythm.   No murmur heard. Pulmonary/Chest: Effort normal and breath  sounds normal. No respiratory distress.  Abdominal: Soft. Bowel sounds are normal. There is tenderness.  Patient endorses moderate tenderness in the right lower quadrant. No guarding or rebound. Abdomen is soft without mass.  Genitourinary:  Genitourinary Comments: Normal perianal area without hemorrhoids. Digital exam reveals rectal vault to be completely empty. No masses, fullness or tenderness.  Musculoskeletal: She exhibits no edema or tenderness.  Neurological: She is alert and oriented to person, place, and time. She exhibits normal muscle tone. Coordination normal.  Skin: Skin is warm and dry.  Psychiatric: She has a normal mood and affect.  Nursing note and vitals reviewed.    ED Treatments / Results  Labs (all labs ordered are listed, but only abnormal results are displayed) Labs Reviewed  URINALYSIS, ROUTINE W REFLEX MICROSCOPIC (NOT AT [facility]) - Abnormal; Notable for the following:  Result Value   Leukocytes, UA SMALL (*)    All other components within normal limits  URINE MICROSCOPIC-ADD ON - Abnormal; Notable for the following:    Squamous Epithelial / LPF 0-5 (*)    All other components within normal limits  LIPASE, BLOOD  COMPREHENSIVE METABOLIC PANEL  CBC    EKG  EKG Interpretation None       Radiology Ct Abdomen Pelvis W Contrast  Result Date: 08/11/2016 CLINICAL DATA:  Generalized abdominal pain and constipation. Ileus versus distal colonic obstruction on radiographs from earlier today. EXAM: CT ABDOMEN AND PELVIS WITH CONTRAST TECHNIQUE: Multidetector CT imaging of the abdomen and pelvis was performed using the standard protocol following bolus administration of intravenous contrast. CONTRAST:  27mL ISOVUE-300 IOPAMIDOL (ISOVUE-300) INJECTION 61%, 184mL ISOVUE-300 IOPAMIDOL (ISOVUE-300) INJECTION 61% COMPARISON:  Chest and abdominal radiographs from earlier today. 04/04/2016 CT abdomen/pelvis. FINDINGS: Lower chest: Subpleural 3 mm anterior right lower  lobe pulmonary nodule is stable back to 12/12/2014 and considered benign. Trace pericardial effusion/thickening appears decreased. Partially visualized intact appearing bilateral breast prostheses. Hepatobiliary: Normal liver with no liver mass. Cholecystectomy. Stable bile ducts, which are within expected post cholecystectomy limits. Pancreas: Normal, with no mass or duct dilation. Spleen: Normal size. No mass. Adrenals/Urinary Tract: Normal adrenals. Simple 1.3 cm renal cyst in the anterior interpolar right kidney. No additional renal lesions. No hydronephrosis. Mildly distended bladder with no bladder wall thickening. Stomach/Bowel: Stable appearance status post gastric bypass surgery with collapsed excluded distal stomach and intact appearing gastrojejunostomy, with no appreciable gastric wall thickening. Normal caliber small bowel with no small bowel wall thickening. Oral contrast progresses to the distal small bowel. Normal appendix. There is mild to moderate diffuse gaseous distension of the large bowel. Predominantly liquid stool is present throughout the large bowel and rectum. No large bowel wall thickening or pericolonic fat stranding. No focal large bowel caliber transition. Vascular/Lymphatic: Normal caliber abdominal aorta. Patent portal, splenic, hepatic and renal veins. No pathologically enlarged lymph nodes in the abdomen or pelvis. Reproductive: Status post hysterectomy, with no abnormal findings at the vaginal cuff. No adnexal mass. Other: No pneumoperitoneum, ascites or focal fluid collection. Musculoskeletal: No aggressive appearing focal osseous lesions. Minimal thoracolumbar spondylosis. IMPRESSION: 1. No evidence of bowel obstruction or acute bowel inflammation. 2. Predominantly liquid stools throughout the large bowel, indicating a nonspecific malabsorptive state. 3. Expected postsurgical changes from gastric bypass surgery. 4. Trace pericardial effusion/thickening, decreased from 03/25/2016.  Electronically Signed   By: Ilona Sorrel M.D.   On: 08/11/2016 16:30   Dg Abd Acute W/chest  Result Date: 08/11/2016 CLINICAL DATA:  Diffuse abdominal pain for the past week. No bowel movement for the past 2 weeks. Dysuria. EXAM: DG ABDOMEN ACUTE W/ 1V CHEST COMPARISON:  Abdomen and pelvis CT dated 04/04/2016. Chest radiographs dated 01/10/2016. FINDINGS: Normal sized heart. Clear lungs. Bilateral breast implants. Gas distended colon with air-fluid levels. No significant stool. Oval calcific density in the left pelvis, not previously present. Upper abdominal surgical clips and staples. Left mid abdominal surgical staples. Unremarkable bones. IMPRESSION: 1. Gas distended colon with air-fluid levels, most pronounced involving the sigmoid colon. This could be due to ileus or partial obstruction distally. 2. Oval calcific density in the pelvis on the left, possibly representing an ingested tablet. 3. Previous cholecystectomy and gastric bypass. Electronically Signed   By: Claudie Revering M.D.   On: 08/11/2016 13:43    Procedures Procedures (including critical care time)  Medications Ordered in ED Medications  iopamidol (ISOVUE-300) 61 %  injection 100 mL (100 mLs Intravenous Contrast Given 08/11/16 1557)  iopamidol (ISOVUE-300) 61 % injection 15 mL (15 mLs Oral Contrast Given 08/11/16 1557)     Initial Impression / Assessment and Plan / ED Course  I have reviewed the triage vital signs and the nursing notes.  Pertinent labs & imaging results that were available during my care of the patient were reviewed by me and considered in my medical decision making (see chart for details).  Clinical Course  Value Comment By Time  BUN: 17 (Reviewed) Charlesetta Shanks, MD 09/24 1241    Final Clinical Impressions(s) / ED Diagnoses   Final diagnoses:  Right lower quadrant abdominal pain   CT does not show obstruction. This was done to follow-up on an obstructive pattern identified on acute abdominal series.  Diagnostic studies are within normal limits. Patient does have problems with chronic bowel dysfunction and abdominal pain. She is counseled to follow-up with her gastroenterologist tomorrow to discuss ongoing management. New Prescriptions New Prescriptions   No medications on file     Charlesetta Shanks, MD 08/11/16 1743

## 2016-08-11 NOTE — ED Notes (Signed)
Patient transported to CT 

## 2016-08-11 NOTE — ED Notes (Signed)
MD at bedside. 

## 2017-06-20 ENCOUNTER — Encounter (HOSPITAL_COMMUNITY): Payer: Self-pay | Admitting: Emergency Medicine

## 2017-06-20 ENCOUNTER — Emergency Department (HOSPITAL_COMMUNITY)
Admission: EM | Admit: 2017-06-20 | Discharge: 2017-06-21 | Disposition: A | Discharge status: Home / Self Care | Payer: BC Managed Care – PPO | Attending: Emergency Medicine | Admitting: Emergency Medicine

## 2017-06-20 DIAGNOSIS — G43009 Migraine without aura, not intractable, without status migrainosus: Secondary | ICD-10-CM | POA: Diagnosis not present

## 2017-06-20 DIAGNOSIS — E039 Hypothyroidism, unspecified: Secondary | ICD-10-CM | POA: Insufficient documentation

## 2017-06-20 DIAGNOSIS — Z79899 Other long term (current) drug therapy: Secondary | ICD-10-CM | POA: Diagnosis not present

## 2017-06-20 MED ORDER — SODIUM CHLORIDE 0.9 % IV BOLUS (SEPSIS)
1000.0000 mL | Freq: Once | INTRAVENOUS | Status: AC
  Administered 2017-06-20: 1000 mL via INTRAVENOUS

## 2017-06-20 MED ORDER — KETOROLAC TROMETHAMINE 15 MG/ML IJ SOLN
15.0000 mg | Freq: Once | INTRAMUSCULAR | Status: AC
Start: 2017-06-20 — End: 2017-06-20
  Administered 2017-06-20: 15 mg via INTRAVENOUS
  Filled 2017-06-20: qty 1

## 2017-06-20 MED ORDER — DEXAMETHASONE SODIUM PHOSPHATE 10 MG/ML IJ SOLN
10.0000 mg | Freq: Once | INTRAMUSCULAR | Status: AC
  Administered 2017-06-20: 10 mg via INTRAVENOUS
  Filled 2017-06-20: qty 1

## 2017-06-20 MED ORDER — PROCHLORPERAZINE EDISYLATE 5 MG/ML IJ SOLN
10.0000 mg | Freq: Once | INTRAMUSCULAR | Status: AC
  Administered 2017-06-20: 10 mg via INTRAVENOUS
  Filled 2017-06-20: qty 2

## 2017-06-20 MED ORDER — DIPHENHYDRAMINE HCL 50 MG/ML IJ SOLN
25.0000 mg | Freq: Once | INTRAMUSCULAR | Status: AC
  Administered 2017-06-20: 25 mg via INTRAVENOUS
  Filled 2017-06-20: qty 1

## 2017-06-20 NOTE — ED Triage Notes (Signed)
Pt states she has a migraine headache that started yesterday  Pt states she went to her dr today and received a shot of phenergan and toradol and has not had any relief  Pt says her head is throbbing and she has nausea without vomiting  Pt states sound makes it worse  Pt left in triage 1 with lights turned out and warm blanket given

## 2017-06-20 NOTE — ED Provider Notes (Signed)
[facility] DEPT Provider Note   CSN: [id number] Arrival date & time: 06/20/17  2109     History   Chief Complaint Chief Complaint  Patient presents with  . Migraine    HPI Morgan [name] is a 46 y.o. female.  The history is provided by the patient.  Migraine  This is a recurrent (similar to prior migraines) problem. The current episode started 2 days ago. The problem occurs constantly. The problem has not changed since onset.Associated symptoms include headaches. Pertinent negatives include no chest pain, no abdominal pain and no shortness of breath. Exacerbated by: light and sound. Nothing relieves the symptoms. Treatments tried: tylenol. Toradol and phenergan provided by PCP yesterday. The treatment provided no relief.    Past Medical History:  Diagnosis Date  . Acid reflux   . Family history of adverse reaction to anesthesia    sister has PONV  . H. pylori infection    hx of , none now  . Headache    migraines, uses imitrex as needed  . Hematuria   . History of kidney stones   . Hypothyroidism   . PONV (postoperative nausea and vomiting)   . Renal calculi BILATERAL  . Right ureteral stone   . RLS (restless legs syndrome)   . TBI (traumatic brain injury) ([place]) 04/12/2015    Patient Active Problem List   Diagnosis Date Noted  . Major depressive disorder, recurrent, severe without psychotic behavior ([place]) 03/13/2016  . PTSD (post-traumatic stress disorder) 03/13/2016  . Chronic post-traumatic stress disorder (PTSD) 03/13/2016  . Depression   . Suicidal ideation   . Ureteral calculus 12/26/2014  . Ureteral calculus, right 03/31/2013    Past Surgical History:  Procedure Laterality Date  . ABDOMINAL HYSTERECTOMY    . CHOLECYSTECTOMY    . CYSTOSCOPY WITH RETROGRADE PYELOGRAM, URETEROSCOPY AND STENT PLACEMENT Left 12/26/2014   Procedure: CYSTOSCOPY WITH RETROGRADE PYELOGRAM, URETEROSCOPY AND STENT PLACEMENT;  Surgeon: Bernestine Amass, MD;  Location: [facility];  Service: Urology;  Laterality: Left;  . CYSTOSCOPY/RETROGRADE/URETEROSCOPY Right 03/31/2013   Procedure: CYSTOSCOPY/RETROGRADE/URETEROSCOPY;  Surgeon: Bernestine Amass, MD;  Location: [facility];  Service: Urology;  Laterality: Right;  . 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  . HOLMIUM LASER APPLICATION Right 5/57/3220   Procedure: HOLMIUM LASER APPLICATION;  Surgeon: Bernestine Amass, MD;  Location[facility];  Service: Urology;  Laterality: Right;  . HOLMIUM LASER APPLICATION Left 12/23/4268   Procedure: HOLMIUM LASER APPLICATION;  Surgeon: Bernestine Amass, MD;  Location: [facility];  Service: Urology;  Laterality: Left;  . LAPAROSCOPIC ASSISTED VAGINAL HYSTERECTOMY  MARCH 2014   W/ BILATERAL SALPINGOOPHORECTOMY  . LAPAROSCOPIC GASTRIC BANDING  SEPT 2011  . TONSILLECTOMY AND ADENOIDECTOMY  AGE 10'S  . URETEROLITHOTOMY  NOV 2013    OB History    No data available       Home Medications    Prior to Admission medications   Medication Sig Start Date End Date Taking? Authorizing Provider  cyclobenzaprine (FLEXERIL) 10 MG tablet Take 10 mg by mouth 3 (three) times daily as needed for muscle spasms.    [provider]  gabapentin (NEURONTIN) 300 MG capsule Take 300 mg by mouth 3 (three) times daily.  09/06/14   [provider]  ipratropium-albuterol (DUONEB) 0.5-2.5 (3) MG/3ML SOLN Inhale 3 mLs into the lungs every 6 (six) hours as needed (SOB, wheezing).     [provider]  levothyroxine (LEVOTHROID) 88 MCG tablet Take 88  mcg by mouth daily before breakfast.    [provider]  linaclotide (LINZESS) 290 MCG CAPS capsule Take 290 mcg by mouth daily before breakfast.    [provider]  montelukast (SINGULAIR) 10 MG tablet Take 10 mg by mouth at bedtime.  02/05/16   [provider]  Multiple Vitamin (MULTIVITAMIN WITH MINERALS) TABS tablet Take 1 tablet by mouth daily.    [provider]  ondansetron (ZOFRAN ODT) 4 MG disintegrating tablet Take 4 mg by mouth every 8 (eight) hours as needed for nausea.  02/05/16   [provider]  sertraline (ZOLOFT) 100 MG tablet Take 200 mg by mouth at bedtime.     [provider]  topiramate (TOPAMAX) 100 MG tablet Take 1 tablet (100 mg total) by mouth 2 (two) times daily. 03/17/16   Rankin, Shuvon B, NP  topiramate (TOPAMAX) 50 MG tablet Take 50 mg by mouth 2 (two) times daily.     [provider]  traMADol (ULTRAM) 50 MG tablet Take 50 mg by mouth every 6 (six) hours as needed for moderate pain (kidney stones).    [provider]  traZODone (DESYREL) 50 MG tablet Take 100 mg by mouth at bedtime.     [provider]    Family History Family History  Problem Relation Age of Onset  . Diabetes Mother   . Hypertension Mother   . Cancer Mother   . Hypertension Father   . COPD Father   . Deafness Father   . Emphysema Father     Social History Social History  Substance Use Topics  . Smoking status: Never Smoker  . Smokeless tobacco: Never Used  . Alcohol use No     Allergies   Clindamycin/lincomycin; Nsaids; and Oxycodone   Review of Systems Review of Systems  Constitutional: Negative for chills and fever.  HENT: Negative for ear pain and sore throat.   Eyes: Negative for pain and visual disturbance.  Respiratory: Negative for cough and shortness of breath.   Cardiovascular: Negative for chest pain and palpitations.  Gastrointestinal: Negative for abdominal pain and vomiting.  Genitourinary: Negative for dysuria and hematuria.  Musculoskeletal: Negative for arthralgias and back pain.  Skin: Negative for color change and rash.  Neurological: Positive for headaches. Negative for seizures and syncope.  All other systems reviewed and are negative.    Physical Exam Updated Vital Signs BP 102/71 (BP Location: Left Arm)   Pulse 80   Temp 98.2 F (36.8 C) (Oral)   Resp 16   Ht  5\' 3"  (1.6 m)   Wt 61.7 kg (136 lb)   SpO2 100%   BMI 24.09 kg/m   Physical Exam  Constitutional: She is oriented to person, place, and time. She appears well-developed and well-nourished. No distress.  HENT:  Head: Normocephalic and atraumatic.  Nose: Nose normal.  Eyes: Pupils are equal, round, and reactive to light. Conjunctivae and EOM are normal. Right eye exhibits no discharge. Left eye exhibits no discharge. No scleral icterus.  Neck: Normal range of motion. Neck supple.  Cardiovascular: Normal rate and regular rhythm.  Exam reveals no gallop and no friction rub.   No murmur heard. Pulmonary/Chest: Effort normal and breath sounds normal. No stridor. No respiratory distress. She has no rales.  Abdominal: Soft. She exhibits no distension. There is no tenderness.  Musculoskeletal: She exhibits no edema or tenderness.  Neurological: She is alert and oriented to person, place, and time.  Mental Status: Alert and oriented  to person, place, and time. Attention and concentration normal. Speech clear. Recent memory is intact  Cranial Nerves  II Visual Fields: Intact to confrontation. Visual fields intact. III, IV, VI: Pupils equal and reactive to light and near. Full eye movement without nystagmus  V Facial Sensation: Normal. No weakness of masticatory muscles  VII: No facial weakness or asymmetry  VIII Auditory Acuity: Grossly normal  IX/X: The uvula is midline; the palate elevates symmetrically  XI: Normal sternocleidomastoid and trapezius strength  XII: The tongue is midline. No atrophy or fasciculations.   Motor System: Muscle Strength: 5/5 and symmetric in the upper and lower extremities. No pronation or drift.  Muscle Tone: Tone and muscle bulk are normal in the upper and lower extremities.   Reflexes: DTRs: 1+ and symmetrical in all four extremities. Plantar responses are flexor bilaterally.  Coordination: Intact finger-to-nose, heel-to-shin. No tremor.  Sensation: Intact to  light touch, and pinprick.  Gait: Routine gait normal.    Skin: Skin is warm and dry. No rash noted. She is not diaphoretic. No erythema.  Psychiatric: She has a normal mood and affect.  Vitals reviewed.    ED Treatments / Results  Labs (all labs ordered are listed, but only abnormal results are displayed) Labs Reviewed - No data to display  EKG  EKG Interpretation None       Radiology No results found.  Procedures Procedures (including critical care time)  Medications Ordered in ED Medications  diphenhydrAMINE (BENADRYL) injection 25 mg (25 mg Intravenous Given 06/20/17 2320)  dexamethasone (DECADRON) injection 10 mg (10 mg Intravenous Given 06/20/17 2320)  prochlorperazine (COMPAZINE) injection 10 mg (10 mg Intravenous Given 06/20/17 2321)  ketorolac (TORADOL) 15 MG/ML injection 15 mg (15 mg Intravenous Given 06/20/17 2321)  sodium chloride 0.9 % bolus 1,000 mL (1,000 mLs Intravenous New Bag/Given 06/20/17 2319)     Initial Impression / Assessment and Plan / ED Course  I have reviewed the triage vital signs and the nursing notes.  Pertinent labs & imaging results that were available during my care of the patient were reviewed by me and considered in my medical decision making (see chart for details).     Typical migraine headache for the pt. Non focal neuro exam. No recent head trauma. No fever. Doubt meningitis. Doubt intracranial bleed. Doubt IIH. No indication for imaging. Will treat with migraine cocktail and reevaluate.  11:55 PM Significant improvement following a migraine cocktail.  The patient is safe for discharge with strict return precautions.   Final Clinical Impressions(s) / ED Diagnoses   Final diagnoses:  Migraine without aura and without status migrainosus, not intractable   Disposition: Discharge  Condition: Good  I have discussed the results, Dx and Tx plan with the patient who expressed understanding and agree(s) with the plan. Discharge  instructions discussed at great length. The patient was given strict return precautions who verbalized understanding of the instructions. No further questions at time of discharge.    New Prescriptions   No medications on file    Follow Up: Janie Morning, DO [place] [address] [phone]  Schedule an appointment as soon as possible for a visit  As needed      Cardama, Grayce Sessions, MD 06/20/17 2356

## 2017-07-02 DIAGNOSIS — D225 Melanocytic nevi of trunk: Secondary | ICD-10-CM | POA: Diagnosis not present

## 2017-07-02 DIAGNOSIS — Z862 Personal history of diseases of the blood and blood-forming organs and certain disorders involving the immune mechanism: Secondary | ICD-10-CM | POA: Diagnosis not present

## 2017-08-16 ENCOUNTER — Emergency Department (HOSPITAL_COMMUNITY)
Admission: EM | Admit: 2017-08-16 | Discharge: 2017-08-16 | Discharge status: Against Medical Advice | Payer: BC Managed Care – PPO

## 2017-08-16 NOTE — ED Notes (Signed)
Pt told EMT they did not want to have to wait and that they were going to be seen elsewhere.

## 2017-10-22 ENCOUNTER — Emergency Department (HOSPITAL_COMMUNITY)
Admission: EM | Admit: 2017-10-22 | Discharge: 2017-10-22 | Discharge status: Against Medical Advice | Payer: BC Managed Care – PPO

## 2017-10-22 NOTE — ED Notes (Addendum)
Pt LWBS prior to triage. She returned her name labels to registration.

## 2017-10-23 ENCOUNTER — Other Ambulatory Visit: Payer: Self-pay | Admitting: Neurology

## 2017-10-23 DIAGNOSIS — T48295A Adverse effect of other drugs acting on muscles, initial encounter: Secondary | ICD-10-CM

## 2017-10-23 DIAGNOSIS — M542 Cervicalgia: Secondary | ICD-10-CM

## 2017-10-29 ENCOUNTER — Emergency Department (HOSPITAL_COMMUNITY): Payer: BC Managed Care – PPO

## 2017-10-29 ENCOUNTER — Encounter (HOSPITAL_COMMUNITY): Payer: Self-pay | Admitting: *Deleted

## 2017-10-29 ENCOUNTER — Emergency Department (HOSPITAL_COMMUNITY)
Admission: EM | Admit: 2017-10-29 | Discharge: 2017-10-30 | Disposition: A | Discharge status: Home / Self Care | Payer: BC Managed Care – PPO | Attending: Emergency Medicine | Admitting: Emergency Medicine

## 2017-10-29 DIAGNOSIS — R51 Headache: Secondary | ICD-10-CM | POA: Insufficient documentation

## 2017-10-29 DIAGNOSIS — Z79899 Other long term (current) drug therapy: Secondary | ICD-10-CM | POA: Diagnosis not present

## 2017-10-29 DIAGNOSIS — E039 Hypothyroidism, unspecified: Secondary | ICD-10-CM | POA: Insufficient documentation

## 2017-10-29 DIAGNOSIS — M542 Cervicalgia: Secondary | ICD-10-CM | POA: Insufficient documentation

## 2017-10-29 DIAGNOSIS — H53149 Visual discomfort, unspecified: Secondary | ICD-10-CM | POA: Diagnosis not present

## 2017-10-29 LAB — BASIC METABOLIC PANEL
Anion gap: 6 (ref 5–15)
BUN: 12 mg/dL (ref 6–20)
CALCIUM: 9.2 mg/dL (ref 8.9–10.3)
CO2: 28 mmol/L (ref 22–32)
CREATININE: 0.87 mg/dL (ref 0.44–1.00)
Chloride: 104 mmol/L (ref 101–111)
GFR calc non Af Amer: >60 mL/min (ref >60)
Glucose, Bld: 118 mg/dL — ABNORMAL HIGH (ref 65–99)
Potassium: 3.8 mmol/L (ref 3.5–5.1)
SODIUM: 138 mmol/L (ref 135–145)

## 2017-10-29 LAB — CBC WITH DIFFERENTIAL/PLATELET
BASOS PCT: 1 %
Basophils Absolute: 0 10*3/uL (ref 0.0–0.1)
EOS ABS: 0.4 10*3/uL (ref 0.0–0.7)
Eosinophils Relative: 6 %
HCT: 37.5 % (ref 36.0–46.0)
HEMOGLOBIN: 12.3 g/dL (ref 12.0–15.0)
Lymphocytes Relative: 37 %
Lymphs Abs: 2.4 10*3/uL (ref 0.7–4.0)
MCH: 26.7 pg (ref 26.0–34.0)
MCHC: 32.8 g/dL (ref 30.0–36.0)
MCV: 81.5 fL (ref 78.0–100.0)
MONOS PCT: 6 %
Monocytes Absolute: 0.4 10*3/uL (ref 0.1–1.0)
NEUTROS PCT: 50 %
Neutro Abs: 3.3 10*3/uL (ref 1.7–7.7)
PLATELETS: 173 10*3/uL (ref 150–400)
RBC: 4.6 MIL/uL (ref 3.87–5.11)
RDW: 14.3 % (ref 11.5–15.5)
WBC: 6.5 10*3/uL (ref 4.0–10.5)

## 2017-10-29 MED ORDER — METOCLOPRAMIDE HCL 5 MG/ML IJ SOLN
10.0000 mg | Freq: Once | INTRAMUSCULAR | Status: AC
  Administered 2017-10-29: 10 mg via INTRAVENOUS
  Filled 2017-10-29: qty 2

## 2017-10-29 MED ORDER — DICLOFENAC SODIUM 1 % TD GEL: 4.0000 g | Freq: Four times a day (QID) | TRANSDERMAL | 0 refills | Status: DC

## 2017-10-29 MED ORDER — SODIUM CHLORIDE 0.9 % IV BOLUS (SEPSIS)
500.0000 mL | Freq: Once | INTRAVENOUS | Status: AC
  Administered 2017-10-29: 500 mL via INTRAVENOUS

## 2017-10-29 MED ORDER — PROCHLORPERAZINE EDISYLATE 5 MG/ML IJ SOLN
10.0000 mg | Freq: Once | INTRAMUSCULAR | Status: AC
  Administered 2017-10-29: 10 mg via INTRAVENOUS
  Filled 2017-10-29: qty 2

## 2017-10-29 MED ORDER — LORAZEPAM 2 MG/ML IJ SOLN
0.5000 mg | Freq: Once | INTRAMUSCULAR | Status: AC
  Administered 2017-10-29: 0.5 mg via INTRAVENOUS
  Filled 2017-10-29: qty 1

## 2017-10-29 NOTE — ED Triage Notes (Signed)
Pt complains of migraine for the past couple days, with pain radiating down her neck. Pt had botox injection last month for the migraine but states her pain is getting worse. Pt states she is supposed to have CT of her head on Friday for neck.

## 2017-10-29 NOTE — ED Notes (Signed)
Patient transported to MRI 

## 2017-10-29 NOTE — Discharge Instructions (Signed)
It was my pleasure taking care of you today!   Voltaren gel to the neck as directed for pain. You can continue your home pain regimen along with this.   Please call your neurologist in the morning to schedule a follow up appointment.   Return to ER for new or worsening symptoms, any additional concerns.

## 2017-10-29 NOTE — ED Provider Notes (Signed)
[facility] DEPT Provider Note   CSN: [id number] Arrival date & time: 10/29/17  1553     History   Chief Complaint Chief Complaint  Patient presents with  . Migraine  . Neck Pain    HPI Morgan [name] is a 46 y.o. female.  The history is provided by the patient and medical records. No language interpreter was used.  Migraine  Associated symptoms include headaches.  Neck Pain   Associated symptoms include photophobia and headaches. Pertinent negatives include no numbness and no weakness.   Morgan [name] is a 46 y.o. female  with a PMH of TBI, migraines who presents to the Emergency Department complaining of headache x 2-3 days. Associated with photophobia. They feel similar to prior migraines and is both frontal and to the top of the head.  She also endorses pain to bilateral aspects of her neck for 1-2 weeks.  She was seen by her neurologist on 12 6 for this neck pain.  She was started on Valium and steroid taper, and ordered an outpatient CT neck soft tissue to be obtained. She does not feel like these medications are helping. Neck pain is worse with any movement of the neck (up, down, left or right). No fever, chills. No slurred speech, new confusion, visual changes, nausea or vomiting.     Past Medical History:  Diagnosis Date  . Acid reflux   . Family history of adverse reaction to anesthesia    sister has PONV  . H. pylori infection    hx of , none now  . Headache    migraines, uses imitrex as needed  . Hematuria   . History of kidney stones   . Hypothyroidism   . PONV (postoperative nausea and vomiting)   . Renal calculi BILATERAL  . Right ureteral stone   . RLS (restless legs syndrome)   . TBI (traumatic brain injury) ([place]) 04/12/2015    Patient Active Problem List   Diagnosis Date Noted  . Major depressive disorder, recurrent, severe without psychotic behavior ([place]) 03/13/2016  . PTSD (post-traumatic stress disorder)  03/13/2016  . Chronic post-traumatic stress disorder (PTSD) 03/13/2016  . Depression   . Suicidal ideation   . Ureteral calculus 12/26/2014  . Ureteral calculus, right 03/31/2013    Past Surgical History:  Procedure Laterality Date  . ABDOMINAL HYSTERECTOMY    . CHOLECYSTECTOMY    . CYSTOSCOPY WITH RETROGRADE PYELOGRAM, URETEROSCOPY AND STENT PLACEMENT Left 12/26/2014   Procedure: CYSTOSCOPY WITH RETROGRADE PYELOGRAM, URETEROSCOPY AND STENT PLACEMENT;  Surgeon: Bernestine Amass, MD;  Location: [facility];  Service: Urology;  Laterality: Left;  . CYSTOSCOPY/RETROGRADE/URETEROSCOPY Right 03/31/2013   Procedure: CYSTOSCOPY/RETROGRADE/URETEROSCOPY;  Surgeon: Bernestine Amass, MD;  Location: [facility];  Service: Urology;  Laterality: Right;  . 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  . HOLMIUM LASER APPLICATION Right 2/35/3614   Procedure: HOLMIUM LASER APPLICATION;  Surgeon: Bernestine Amass, MD;  Location: [facility];  Service: Urology;  Laterality: Right;  . HOLMIUM LASER APPLICATION Left 02/19/1539   Procedure: HOLMIUM LASER APPLICATION;  Surgeon: Bernestine Amass, MD;  Location: [facility];  Service: Urology;  Laterality: Left;  . LAPAROSCOPIC ASSISTED VAGINAL HYSTERECTOMY  MARCH 2014   W/ BILATERAL SALPINGOOPHORECTOMY  . LAPAROSCOPIC GASTRIC BANDING  SEPT 2011  . TONSILLECTOMY AND ADENOIDECTOMY  AGE 9'S  . URETEROLITHOTOMY  NOV 2013    OB History    No data available  Home Medications    Prior to Admission medications   Medication Sig Start Date End Date Taking? Authorizing Provider  AIMOVIG 70 MG/ML SOAJ Inject 70 mg into the muscle every 30 (thirty) days.  10/13/17  Yes [provider]  AMITIZA 24 MCG capsule Take 24 mcg by mouth 2 (two) times daily with a meal.  09/24/17  Yes [provider]  busPIRone (BUSPAR) 10 MG tablet Take 10 mg by mouth 3 (three) times daily.  09/10/17  Yes [provider]    diazepam (VALIUM) 5 MG tablet Take 5 mg by mouth every 8 (eight) hours.  10/23/17  Yes [provider]  divalproex (DEPAKOTE ER) 500 MG 24 hr tablet Take 500 mg by mouth daily.  10/29/17  Yes [provider]  fluticasone (FLONASE) 50 MCG/ACT nasal spray Place 1 spray into both nostrils daily as needed for allergies or rhinitis.   Yes [provider]  gabapentin (NEURONTIN) 400 MG capsule Take 400-800 mg by mouth 4 (four) times daily. Take 400 mg (1 capsule) TID and Take 800 mg (2 capsules) at bedtime 09/09/17  Yes [provider]  INGREZZA 80 MG CAPS Take 80 mg by mouth daily.  10/20/17  Yes [provider]  ketorolac (TORADOL) 10 MG tablet TK 1 T PO  Q 6 H PRF P 10/16/17  Yes [provider]  levothyroxine (SYNTHROID, LEVOTHROID) 100 MCG tablet Take 100 mcg by mouth daily before breakfast.  09/09/17  Yes [provider]  linaclotide (LINZESS) 290 MCG CAPS capsule Take 290 mcg by mouth daily before breakfast.   Yes [provider]  Lurasidone HCl (LATUDA) 120 MG TABS Take 1 tablet by mouth daily.   Yes [provider]  montelukast (SINGULAIR) 10 MG tablet TAKE 1 TABLET BY MOUTH NIGHTLY 02/05/16  Yes [provider]  Multiple Vitamin (MULTIVITAMIN WITH MINERALS) TABS tablet Take 1 tablet by mouth daily.   Yes [provider]  nortriptyline (PAMELOR) 10 MG capsule Take 10 mg by mouth at bedtime.  09/09/17  Yes [provider]  omeprazole (PRILOSEC) 40 MG capsule Take 40 mg by mouth daily.  07/29/17  Yes [provider]  predniSONE (DELTASONE) 10 MG tablet Take 10 mg by mouth daily with breakfast.  10/23/17  Yes [provider]  pregabalin (LYRICA) 100 MG capsule Take 100 mg by mouth daily.   Yes [provider]  sertraline (ZOLOFT) 100 MG tablet Take 200 mg by mouth at bedtime.    Yes [provider]  topiramate (TOPAMAX) 100 MG tablet Take 1 tablet (100 mg total)  by mouth 2 (two) times daily. 03/17/16  Yes Rankin, Shuvon B, NP  topiramate (TOPAMAX) 50 MG tablet Take 50 mg by mouth 2 (two) times daily.    Yes [provider]  traZODone (DESYREL) 100 MG tablet Take 100 mg by mouth at bedtime.  09/09/17  Yes [provider]  cyclobenzaprine (FLEXERIL) 10 MG tablet Take 10 mg by mouth 3 (three) times daily as needed for muscle spasms.    [provider]  diazepam (VALIUM) 10 MG tablet  08/13/17   [provider]  diclofenac sodium (VOLTAREN) 1 % GEL Apply 4 g topically 4 (four) times daily. 10/29/17   Maurie Musco, Ozella Almond, PA-C  gabapentin (NEURONTIN) 300 MG capsule Take 300 mg by mouth 3 (three) times daily.  09/06/14   [provider]  hydrOXYzine (VISTARIL) 25 MG capsule Take 25 mg by mouth every 6 (six) hours as  needed for anxiety or itching.  10/29/17   [provider]  ipratropium-albuterol (DUONEB) 0.5-2.5 (3) MG/3ML SOLN Inhale 3 mLs into the lungs every 6 (six) hours as needed (SOB, wheezing).     [provider]  levothyroxine (LEVOTHROID) 88 MCG tablet Take 88 mcg by mouth daily before breakfast.    [provider]  LYRICA 100 MG capsule  10/23/17   [provider]  montelukast (SINGULAIR) 10 MG tablet Take 10 mg by mouth at bedtime.  02/05/16   [provider]  ondansetron (ZOFRAN ODT) 4 MG disintegrating tablet Take 4 mg by mouth every 8 (eight) hours as needed for nausea.  02/05/16   [provider]  prazosin (MINIPRESS) 2 MG capsule Take 2 mg by mouth at bedtime.  09/09/17   [provider]  promethazine (PHENERGAN) 12.5 MG tablet Take 12.5 mg by mouth every 6 (six) hours as needed for nausea or vomiting.  10/29/17   [provider]  traMADol (ULTRAM) 50 MG tablet Take 50 mg by mouth every 6 (six) hours as needed for moderate pain (kidney stones).    [provider]  traZODone (DESYREL) 50 MG tablet Take 100 mg by mouth at  bedtime.     [provider]    Family History Family History  Problem Relation Age of Onset  . Diabetes Mother   . Hypertension Mother   . Cancer Mother   . Hypertension Father   . COPD Father   . Deafness Father   . Emphysema Father     Social History Social History   Tobacco Use  . Smoking status: Never Smoker  . Smokeless tobacco: Never Used  Substance Use Topics  . Alcohol use: No  . Drug use: No     Allergies   Clindamycin/lincomycin; Nsaids; and Oxycodone   Review of Systems Review of Systems  Eyes: Positive for photophobia. Negative for pain and visual disturbance.  Musculoskeletal: Positive for neck pain.  Neurological: Positive for headaches. Negative for dizziness, syncope, weakness, light-headedness and numbness.  All other systems reviewed and are negative.    Physical Exam Updated Vital Signs BP (!) 87/63 (BP Location: Left Arm)   Pulse 79   Temp 98 F (36.7 C) (Oral)   Resp 16   SpO2 97%   Physical Exam  Constitutional: She is oriented to person, place, and time. She appears well-developed and well-nourished. No distress.  HENT:  Head: Normocephalic and atraumatic.  Mouth/Throat: Oropharynx is clear and moist.  No tenderness of the temporal artery   Eyes: Conjunctivae and EOM are normal. Pupils are equal, round, and reactive to light. No scleral icterus.  No nystagmus   Neck: Normal range of motion. Neck supple.  Diffuse tenderness to bilateral paraspinal musculature as well as midline. Paraspinal much more tender than midline. Decreased range of motion 2/2 pain.   Cardiovascular: Normal rate, regular rhythm, normal heart sounds and intact distal pulses.  Pulmonary/Chest: Effort normal and breath sounds normal. No respiratory distress. She has no wheezes. She has no rales.  Abdominal: Soft. Bowel sounds are normal. She exhibits no distension. There is no tenderness. There is no rebound and no guarding.  Musculoskeletal: Normal  range of motion.  Lymphadenopathy:    She has no cervical adenopathy.  Neurological: She is alert and oriented to person, place, and time. She has normal reflexes. No cranial nerve deficit. Coordination normal.  Alert, oriented, thought content appropriate, able to give a coherent history. Speech is clear and  goal oriented, able to follow commands.  Cranial Nerves:  II:  Peripheral visual fields grossly normal, pupils equal, round, reactive to light III, IV, VI: EOM intact bilaterally, ptosis not present V,VII: smile symmetric, eyes kept closed tightly against resistance, facial light touch sensation equal VIII: hearing grossly normal IX, X: symmetric soft palate movement, uvula elevates symmetrically  XI: bilateral shoulder shrug symmetric and strong XII: midline tongue extension 5/5 muscle strength in upper and lower extremities bilaterally including strong and equal grip strength and dorsiflexion/plantar flexion Sensory to light touch normal in all four extremities.  Normal finger-to-nose and rapid alternating movements. No drift.  Skin: Skin is warm and dry. No rash noted. She is not diaphoretic.  Nursing note and vitals reviewed.    ED Treatments / Results  Labs (all labs ordered are listed, but only abnormal results are displayed) Labs Reviewed  BASIC METABOLIC PANEL - Abnormal; Notable for the following components:      Result Value   Glucose, Bld 118 (*)    All other components within normal limits  CBC WITH DIFFERENTIAL/PLATELET    EKG  EKG Interpretation None       Radiology Mr Cervical Spine Wo Contrast  Result Date: 10/29/2017 CLINICAL DATA:  Migraine with pain radiating to the neck. Recent but ox injection. EXAM: MRI CERVICAL SPINE WITHOUT CONTRAST TECHNIQUE: Multiplanar, multisequence MR imaging of the cervical spine was performed. No intravenous contrast was administered. COMPARISON:  None. FINDINGS: Alignment: Physiologic. Vertebrae: No fracture, evidence of  discitis, or bone lesion. No facet edema. Cord: Normal signal and morphology. Posterior Fossa, vertebral arteries, paraspinal tissues: Normal position of the cerebellar tonsils. Visualized posterior fossa is normal. Vertebral artery flow voids are preserved. No prevertebral soft tissue swelling. Disc levels: There is no disc herniation, spinal canal stenosis or neural foraminal stenosis. IMPRESSION: Normal MRI of the cervical spine. Electronically Signed   By: Ulyses Jarred M.D.   On: 10/29/2017 22:47    Procedures Procedures (including critical care time)  Medications Ordered in ED Medications  sodium chloride 0.9 % bolus 500 mL (0 mLs Intravenous Stopped 10/29/17 2320)  metoCLOPramide (REGLAN) injection 10 mg (10 mg Intravenous Given 10/29/17 2202)  prochlorperazine (COMPAZINE) injection 10 mg (10 mg Intravenous Given 10/29/17 2202)  LORazepam (ATIVAN) injection 0.5 mg (0.5 mg Intravenous Given 10/29/17 2203)     Initial Impression / Assessment and Plan / ED Course  I have reviewed the triage vital signs and the nursing notes.  Pertinent labs & imaging results that were available during my care of the patient were reviewed by me and considered in my medical decision making (see chart for details).    Morgan [name] is a 46 y.o. female who presents to ED for headache and neck pain. Headache today feels like her typical migraines. She was seen by neurologist for neck pain on 12/06 where she was started on steroid taper and valium with little improvement. Neurology has CT neck pending. Given persistent neck pain, will obtain MRI in the ER to further evaluate. Will give ativan for muscle relaxant and migraine cocktail.   Labs reviewed and reassuring.  MRI negative.  Patient feels improved with symptomatic medications given in ED.   Will add voltaren gel to current regimen and have patient follow up with neurology.  Reasons to return to ER discussed and all questions answered.  Patient  discussed with Dr. Darl Householder who agrees with treatment plan.    Final Clinical Impressions(s) / ED Diagnoses   Final diagnoses:  Neck pain    ED Discharge Orders        Ordered    diclofenac sodium (VOLTAREN) 1 % GEL  4 times daily     10/29/17 2307         Jaziyah Gradel, Ozella Almond, PA-C 10/29/17 2330    Drenda Freeze, MD 10/30/17 [phone]

## 2017-10-31 ENCOUNTER — Other Ambulatory Visit: Payer: Self-pay

## 2018-01-29 DIAGNOSIS — S0990XA Unspecified injury of head, initial encounter: Secondary | ICD-10-CM | POA: Insufficient documentation

## 2018-08-21 ENCOUNTER — Other Ambulatory Visit: Payer: Self-pay | Admitting: Psychiatry

## 2018-08-22 LAB — CBC WITH DIFFERENTIAL/PLATELET
BASOS: 1 %
Basophils Absolute: 0 10*3/uL (ref 0.0–0.2)
EOS (ABSOLUTE): 0.2 10*3/uL (ref 0.0–0.4)
EOS: 4 %
HEMOGLOBIN: 10.1 g/dL — AB (ref 11.1–15.9)
Hematocrit: 31 % — ABNORMAL LOW (ref 34.0–46.6)
LYMPHS ABS: 1.3 10*3/uL (ref 0.7–3.1)
Lymphs: 33 %
MCH: 24.3 pg — AB (ref 26.6–33.0)
MCHC: 32.6 g/dL (ref 31.5–35.7)
MCV: 75 fL — ABNORMAL LOW (ref 79–97)
MONOS ABS: 0.3 10*3/uL (ref 0.1–0.9)
Monocytes: 8 %
Neutrophils Absolute: 2.1 10*3/uL (ref 1.4–7.0)
Neutrophils: 54 %
PLATELETS: 168 10*3/uL (ref 150–450)
RBC: 4.16 x10E6/uL (ref 3.77–5.28)
RDW: 15.8 % — ABNORMAL HIGH (ref 12.3–15.4)
WBC: 3.9 10*3/uL (ref 3.4–10.8)

## 2018-09-08 ENCOUNTER — Ambulatory Visit: Payer: Self-pay | Admitting: Psychiatry

## 2018-09-29 ENCOUNTER — Ambulatory Visit: Payer: Self-pay | Admitting: Psychiatry

## 2018-10-01 ENCOUNTER — Ambulatory Visit: Payer: Self-pay | Admitting: Psychiatry

## 2018-10-22 ENCOUNTER — Ambulatory Visit (INDEPENDENT_AMBULATORY_CARE_PROVIDER_SITE_OTHER): Payer: BC Managed Care – PPO | Admitting: Psychiatry

## 2018-10-22 DIAGNOSIS — F3132 Bipolar disorder, current episode depressed, moderate: Secondary | ICD-10-CM

## 2018-10-22 DIAGNOSIS — F29 Unspecified psychosis not due to a substance or known physiological condition: Secondary | ICD-10-CM | POA: Diagnosis not present

## 2018-10-22 DIAGNOSIS — F4310 Post-traumatic stress disorder, unspecified: Secondary | ICD-10-CM

## 2018-10-22 DIAGNOSIS — G2401 Drug induced subacute dyskinesia: Secondary | ICD-10-CM | POA: Diagnosis not present

## 2018-10-22 MED ORDER — TOPIRAMATE 100 MG PO TABS: 100.0000 mg | ORAL_TABLET | Freq: Two times a day (BID) | ORAL | 0 refills | Status: DC

## 2018-10-22 MED ORDER — LITHIUM CARBONATE 300 MG PO CAPS: 600.0000 mg | ORAL_CAPSULE | Freq: Every day | ORAL | 0 refills | Status: DC

## 2018-10-22 MED ORDER — GABAPENTIN 400 MG PO CAPS: ORAL_CAPSULE | ORAL | 0 refills | Status: DC

## 2018-10-22 MED ORDER — OLANZAPINE 20 MG PO TABS
20.0000 mg | ORAL_TABLET | Freq: Every day | ORAL | 0 refills | Status: DC
Start: 2018-10-22 — End: 2018-11-04

## 2018-10-22 MED ORDER — DIAZEPAM 5 MG PO TABS: 5.0000 mg | ORAL_TABLET | Freq: Two times a day (BID) | ORAL | 0 refills | Status: DC

## 2018-10-22 MED ORDER — BUSPIRONE HCL 10 MG PO TABS: 10.0000 mg | ORAL_TABLET | Freq: Three times a day (TID) | ORAL | 0 refills | Status: DC

## 2018-10-22 MED ORDER — HYDROXYZINE HCL 25 MG PO TABS: 25.0000 mg | ORAL_TABLET | Freq: Two times a day (BID) | ORAL | 0 refills | Status: DC

## 2018-10-22 MED ORDER — FLUVOXAMINE MALEATE 50 MG PO TABS: ORAL_TABLET | ORAL | 0 refills | Status: DC

## 2018-10-22 MED ORDER — SERTRALINE HCL 100 MG PO TABS: 100.0000 mg | ORAL_TABLET | Freq: Every day | ORAL | 0 refills | Status: DC

## 2018-10-22 MED ORDER — TRAZODONE HCL 100 MG PO TABS: 100.0000 mg | ORAL_TABLET | Freq: Every day | ORAL | 0 refills | Status: DC

## 2018-10-22 MED ORDER — PRAZOSIN HCL 2 MG PO CAPS: 2.0000 mg | ORAL_CAPSULE | Freq: Every day | ORAL | 0 refills | Status: DC

## 2018-10-22 MED ORDER — TOPIRAMATE 50 MG PO TABS: 50.0000 mg | ORAL_TABLET | Freq: Two times a day (BID) | ORAL | 0 refills | Status: DC

## 2018-10-22 MED ORDER — CYCLOBENZAPRINE HCL 10 MG PO TABS: 10.0000 mg | ORAL_TABLET | Freq: Three times a day (TID) | ORAL | 0 refills | Status: DC | PRN

## 2018-10-22 NOTE — Progress Notes (Signed)
[facility] med check  Patient ID: Morgan [name],  MRN: [id number]  PCP: [place]  Date of Evaluation: 10/22/2018 Time spent: 45 minutes  Chief Complaint:   HISTORY/CURRENT STATUS: HPI patient was last seen 08/11/2018.  Carries a diagnosis of bipolar disorder with psychosis, PTSD, history of TD.  At her last visit she was slightly better but continued to have psychosis that time we increased her clozapine, already on Zyprexa, on Ingrezza. Since her last visit she has been off the clozapine for a month.  Feels like she is doing worse.  Manic symptoms as recently as last week.  Depression is worse is a friend of hers committed suicide.  Patient having passive suicidal thoughts. over week ago she did have active suicidal thoughts using pills.  She continues to have auditory hallucinations both inside and outside her head.  Multiple voices.  Command to kill herself.   she still sees people frequently.  Delusions feel like the TV and radio can control her thoughts.  Paranoia she would like she is being followed sometimes messing with her food or car she feels like someone is out to get her. Her PTSD bothers her some.  Denies daily symptoms.  Individual Medical History/ Review of Systems: Changes? :No   Allergies: Clindamycin/lincomycin; Nsaids; Oxycodone; and Percocet [oxycodone-acetaminophen]  Current Medications:  Current Outpatient Medications:  .  busPIRone (BUSPAR) 10 MG tablet, Take 1 tablet (10 mg total) by mouth 3 (three) times daily., Disp: 270 tablet, Rfl: 0 .  cyclobenzaprine (FLEXERIL) 10 MG tablet, Take 1 tablet (10 mg total) by mouth 3 (three) times daily as needed for muscle spasms., Disp: 270 tablet, Rfl: 0 .  diazepam (VALIUM) 5 MG tablet, Take 1 tablet (5 mg total) by mouth 2 (two) times daily., Disp: 180 tablet, Rfl: 0 .  gabapentin (NEURONTIN) 400 MG capsule, 2 in am  2 at lunch, 4 at bed, Disp: 750 capsule, Rfl: 0 .  hydrOXYzine (ATARAX/VISTARIL) 25  MG tablet, Take 1 tablet (25 mg total) by mouth 2 (two) times daily., Disp: 180 tablet, Rfl: 0 .  INGREZZA 80 MG CAPS, Take 80 mg by mouth daily. , Disp: , Rfl:  .  lithium carbonate 300 MG capsule, Take 2 capsules (600 mg total) by mouth daily., Disp: 180 capsule, Rfl: 0 .  LYRICA 100 MG capsule, , Disp: , Rfl:  .  OLANZapine (ZYPREXA) 20 MG tablet, Take 1 tablet (20 mg total) by mouth at bedtime., Disp: 90 tablet, Rfl: 0 .  prazosin (MINIPRESS) 2 MG capsule, Take 1 capsule (2 mg total) by mouth at bedtime., Disp: 90 capsule, Rfl: 0 .  pregabalin (LYRICA) 100 MG capsule, Take 100 mg by mouth daily., Disp: , Rfl:  .  sertraline (ZOLOFT) 100 MG tablet, Take 1 tablet (100 mg total) by mouth at bedtime., Disp: 90 tablet, Rfl: 0 .  topiramate (TOPAMAX) 100 MG tablet, Take 1 tablet (100 mg total) by mouth 2 (two) times daily., Disp: 180 tablet, Rfl: 0 .  topiramate (TOPAMAX) 50 MG tablet, Take 1 tablet (50 mg total) by mouth 2 (two) times daily., Disp: 180 tablet, Rfl: 0 .  traZODone (DESYREL) 100 MG tablet, Take 100 mg by mouth at bedtime. , Disp: , Rfl:  .  AIMOVIG 70 MG/ML SOAJ, Inject 70 mg into the muscle every 30 (thirty) days. , Disp: , Rfl: 11 .  AMITIZA 24 MCG capsule, Take 24 mcg by mouth 2 (two) times daily with a meal. , Disp: ,  Rfl:  .  diclofenac sodium (VOLTAREN) 1 % GEL, Apply 4 g topically 4 (four) times daily., Disp: 1 Tube, Rfl: 0 .  fluticasone (FLONASE) 50 MCG/ACT nasal spray, Place 1 spray into both nostrils daily as needed for allergies or rhinitis., Disp: , Rfl:  .  fluvoxaMINE (LUVOX) 50 MG tablet, 1 hs for 1 week, then 2 hs, Disp: 180 tablet, Rfl: 0 .  hydrOXYzine (VISTARIL) 25 MG capsule, Take 25 mg by mouth every 6 (six) hours as needed for anxiety or itching. , Disp: , Rfl:  .  ipratropium-albuterol (DUONEB) 0.5-2.5 (3) MG/3ML SOLN, Inhale 3 mLs into the lungs every 6 (six) hours as needed (SOB, wheezing). , Disp: , Rfl:  .  ketorolac (TORADOL) 10 MG tablet, TK 1 T PO  Q 6 H  PRF P, Disp: , Rfl: 0 .  levothyroxine (LEVOTHROID) 88 MCG tablet, Take 88 mcg by mouth daily before breakfast., Disp: , Rfl:  .  levothyroxine (SYNTHROID, LEVOTHROID) 100 MCG tablet, Take 100 mcg by mouth daily before breakfast. , Disp: , Rfl:  .  linaclotide (LINZESS) 290 MCG CAPS capsule, Take 290 mcg by mouth daily before breakfast., Disp: , Rfl:  .  montelukast (SINGULAIR) 10 MG tablet, Take 10 mg by mouth at bedtime. , Disp: , Rfl:  .  montelukast (SINGULAIR) 10 MG tablet, TAKE 1 TABLET BY MOUTH NIGHTLY, Disp: , Rfl:  .  Multiple Vitamin (MULTIVITAMIN WITH MINERALS) TABS tablet, Take 1 tablet by mouth daily., Disp: , Rfl:  .  omeprazole (PRILOSEC) 40 MG capsule, Take 40 mg by mouth daily. , Disp: , Rfl:  .  ondansetron (ZOFRAN ODT) 4 MG disintegrating tablet, Take 4 mg by mouth every 8 (eight) hours as needed for nausea. , Disp: , Rfl:  .  predniSONE (DELTASONE) 10 MG tablet, Take 10 mg by mouth daily with breakfast. , Disp: , Rfl:  .  promethazine (PHENERGAN) 12.5 MG tablet, Take 12.5 mg by mouth every 6 (six) hours as needed for nausea or vomiting. , Disp: , Rfl:  .  traMADol (ULTRAM) 50 MG tablet, Take 50 mg by mouth every 6 (six) hours as needed for moderate pain (kidney stones)., Disp: , Rfl:  .  traZODone (DESYREL) 100 MG tablet, Take 1 tablet (100 mg total) by mouth at bedtime. (Patient not taking: Reported on 10/22/2018), Disp: 90 tablet, Rfl: 0 Medication Side Effects: none  Family Medical/ Social History: Changes? No  MENTAL HEALTH EXAM:  There were no vitals taken for this visit.There is no height or weight on file to calculate BMI.  General Appearance: Casual normal gait and station.  Eye Contact:  Good  Speech:  Normal Rate  Volume:  Normal  Mood:  Depressed  Affect:  Appropriate  Thought Process:  Linear  Orientation:  Full (Time, Place, and Person)  Thought Content: Delusions, Hallucinations: Command:  voices telling her to hurt self Visual and Paranoid Ideation    Suicidal Thoughts:  No  Homicidal Thoughts:  No  Memory:  WNL  Judgement:  Good  Insight:  Good  Psychomotor Activity:  Normal  Concentration:  Concentration: Good  Recall:  Good  Fund of Knowledge: Good  Language: Good  Assets:  Desire for Improvement  ADL's:  Intact  Cognition: WNL  Prognosis:  Fair    DIAGNOSES:    ICD-10-CM   1. Bipolar affective disorder, currently depressed, moderate ([place]) F31.32   2. Psychosis, unspecified psychosis type ([place]) F29   3. PTSD (post-traumatic stress disorder) F43.10  4. Tardive dyskinesia G24.01     Receiving Psychotherapy: No    RECOMMENDATIONS: As patient has been off clozapine for 1 month and only afforded her a little bit of help, will not restart the clozapine.  She will start Luvox 50 mg a day for a week and then 100 mg a day.  Her Zyprexa was increased back to 20 mg a day.  She continued her rest of her medications. She commits to safety.  She is to return in 2 weeks. She is on gabapentin at 800 mg in the morning 800 at lunch and 1600 at bedtime.  She also is on Lyrica 100 mg a day.  We will follow this.  Consider decreasing her gabapentin at the next visit.  We will also follow her for signs of serotonin syndrome. We will also follow her for signs of serotonin syndrome.   Comer Locket, PA-C

## 2018-11-04 ENCOUNTER — Ambulatory Visit (INDEPENDENT_AMBULATORY_CARE_PROVIDER_SITE_OTHER): Payer: BC Managed Care – PPO | Admitting: Psychiatry

## 2018-11-04 DIAGNOSIS — F3130 Bipolar disorder, current episode depressed, mild or moderate severity, unspecified: Secondary | ICD-10-CM

## 2018-11-04 DIAGNOSIS — F29 Unspecified psychosis not due to a substance or known physiological condition: Secondary | ICD-10-CM

## 2018-11-04 DIAGNOSIS — F4310 Post-traumatic stress disorder, unspecified: Secondary | ICD-10-CM

## 2018-11-04 MED ORDER — CYCLOBENZAPRINE HCL 10 MG PO TABS: 10.0000 mg | ORAL_TABLET | Freq: Three times a day (TID) | ORAL | 0 refills | Status: AC | PRN

## 2018-11-04 MED ORDER — GABAPENTIN 800 MG PO TABS: ORAL_TABLET | ORAL | 0 refills | Status: DC

## 2018-11-04 MED ORDER — FLUVOXAMINE MALEATE 100 MG PO TABS
ORAL_TABLET | ORAL | 0 refills | Status: DC
Start: 2018-11-04 — End: 2019-03-16

## 2018-11-04 MED ORDER — DIAZEPAM 5 MG PO TABS: 5.0000 mg | ORAL_TABLET | Freq: Two times a day (BID) | ORAL | 0 refills | Status: DC

## 2018-11-04 MED ORDER — OLANZAPINE 20 MG PO TABS: 20.0000 mg | ORAL_TABLET | Freq: Every day | ORAL | 0 refills | Status: DC

## 2018-11-04 MED ORDER — PRAZOSIN HCL 2 MG PO CAPS: 2.0000 mg | ORAL_CAPSULE | Freq: Every day | ORAL | 0 refills | Status: DC

## 2018-11-04 MED ORDER — TRAZODONE HCL 100 MG PO TABS: ORAL_TABLET | ORAL | 0 refills | Status: DC

## 2018-11-04 MED ORDER — INGREZZA 80 MG PO CAPS: 80.0000 mg | ORAL_CAPSULE | Freq: Every day | ORAL | 3 refills | Status: DC

## 2018-11-04 MED ORDER — BUSPIRONE HCL 10 MG PO TABS: 10.0000 mg | ORAL_TABLET | Freq: Three times a day (TID) | ORAL | 0 refills | Status: DC

## 2018-11-04 NOTE — Progress Notes (Signed)
[facility]  Patient ID: KINDEL ROCHEFORT,  MRN: [id number]  PCP: [place]  Date of Evaluation: 11/04/2018 Time spent:20 minutes  Chief Complaint:   HISTORY/CURRENT STATUS: HPI patient last seen 10/22/2018.  Carries diagnosis of bipolar disorder with psychosis and PTSD.  She has active suicidal thoughts.  When she was last seen she had been off clozapine for 1 month so her psychosis and her bipolar were worse.  Decided to start her on Luvox 50 mg a day for a week and then 100 mg and increase his Zyprexa to 20 mg a day. Since her last visit she does feel like she is doing better.  She has had no manic episodes.  Depression is better.  Psychosis is improved.  However they are not as good as they were when she was on clozapine.  No recent suicidal thoughts.  Individual Medical History/ Review of Systems: Changes? :No   Allergies: Clindamycin/lincomycin; Nsaids; Oxycodone; and Percocet [oxycodone-acetaminophen]  Current Medications:  Current Outpatient Medications:  .  AIMOVIG 70 MG/ML SOAJ, Inject 70 mg into the muscle every 30 (thirty) days. , Disp: , Rfl: 11 .  AMITIZA 24 MCG capsule, Take 24 mcg by mouth 2 (two) times daily with a meal. , Disp: , Rfl:  .  busPIRone (BUSPAR) 10 MG tablet, Take 1 tablet (10 mg total) by mouth 3 (three) times daily., Disp: 270 tablet, Rfl: 0 .  cyclobenzaprine (FLEXERIL) 10 MG tablet, Take 1 tablet (10 mg total) by mouth 3 (three) times daily as needed for muscle spasms., Disp: 270 tablet, Rfl: 0 .  diazepam (VALIUM) 5 MG tablet, Take 1 tablet (5 mg total) by mouth 2 (two) times daily., Disp: 180 tablet, Rfl: 0 .  diclofenac sodium (VOLTAREN) 1 % GEL, Apply 4 g topically 4 (four) times daily., Disp: 1 Tube, Rfl: 0 .  fluticasone (FLONASE) 50 MCG/ACT nasal spray, Place 1 spray into both nostrils daily as needed for allergies or rhinitis., Disp: , Rfl:  .  fluvoxaMINE (LUVOX) 50 MG tablet, 1 hs for 1 week, then 2 hs, Disp:  180 tablet, Rfl: 0 .  gabapentin (NEURONTIN) 400 MG capsule, 2 in am  2 at lunch, 4 at bed, Disp: 750 capsule, Rfl: 0 .  hydrOXYzine (ATARAX/VISTARIL) 25 MG tablet, Take 1 tablet (25 mg total) by mouth 2 (two) times daily., Disp: 180 tablet, Rfl: 0 .  hydrOXYzine (VISTARIL) 25 MG capsule, Take 25 mg by mouth every 6 (six) hours as needed for anxiety or itching. , Disp: , Rfl:  .  INGREZZA 80 MG CAPS, Take 80 mg by mouth daily. , Disp: , Rfl:  .  ipratropium-albuterol (DUONEB) 0.5-2.5 (3) MG/3ML SOLN, Inhale 3 mLs into the lungs every 6 (six) hours as needed (SOB, wheezing). , Disp: , Rfl:  .  ketorolac (TORADOL) 10 MG tablet, TK 1 T PO  Q 6 H PRF P, Disp: , Rfl: 0 .  levothyroxine (LEVOTHROID) 88 MCG tablet, Take 88 mcg by mouth daily before breakfast., Disp: , Rfl:  .  levothyroxine (SYNTHROID, LEVOTHROID) 100 MCG tablet, Take 100 mcg by mouth daily before breakfast. , Disp: , Rfl:  .  linaclotide (LINZESS) 290 MCG CAPS capsule, Take 290 mcg by mouth daily before breakfast., Disp: , Rfl:  .  lithium carbonate 300 MG capsule, Take 2 capsules (600 mg total) by mouth daily., Disp: 180 capsule, Rfl: 0 .  LYRICA 100 MG capsule, , Disp: , Rfl:  .  montelukast (SINGULAIR) 10  MG tablet, Take 10 mg by mouth at bedtime. , Disp: , Rfl:  .  montelukast (SINGULAIR) 10 MG tablet, TAKE 1 TABLET BY MOUTH NIGHTLY, Disp: , Rfl:  .  Multiple Vitamin (MULTIVITAMIN WITH MINERALS) TABS tablet, Take 1 tablet by mouth daily., Disp: , Rfl:  .  OLANZapine (ZYPREXA) 20 MG tablet, Take 1 tablet (20 mg total) by mouth at bedtime., Disp: 90 tablet, Rfl: 0 .  omeprazole (PRILOSEC) 40 MG capsule, Take 40 mg by mouth daily. , Disp: , Rfl:  .  ondansetron (ZOFRAN ODT) 4 MG disintegrating tablet, Take 4 mg by mouth every 8 (eight) hours as needed for nausea. , Disp: , Rfl:  .  prazosin (MINIPRESS) 2 MG capsule, Take 1 capsule (2 mg total) by mouth at bedtime., Disp: 90 capsule, Rfl: 0 .  predniSONE (DELTASONE) 10 MG tablet, Take  10 mg by mouth daily with breakfast. , Disp: , Rfl:  .  pregabalin (LYRICA) 100 MG capsule, Take 100 mg by mouth daily., Disp: , Rfl:  .  promethazine (PHENERGAN) 12.5 MG tablet, Take 12.5 mg by mouth every 6 (six) hours as needed for nausea or vomiting. , Disp: , Rfl:  .  sertraline (ZOLOFT) 100 MG tablet, Take 1 tablet (100 mg total) by mouth at bedtime., Disp: 90 tablet, Rfl: 0 .  topiramate (TOPAMAX) 100 MG tablet, Take 1 tablet (100 mg total) by mouth 2 (two) times daily., Disp: 180 tablet, Rfl: 0 .  topiramate (TOPAMAX) 50 MG tablet, Take 1 tablet (50 mg total) by mouth 2 (two) times daily., Disp: 180 tablet, Rfl: 0 .  traMADol (ULTRAM) 50 MG tablet, Take 50 mg by mouth every 6 (six) hours as needed for moderate pain (kidney stones)., Disp: , Rfl:  .  traZODone (DESYREL) 100 MG tablet, Take 100 mg by mouth at bedtime. , Disp: , Rfl:  .  traZODone (DESYREL) 100 MG tablet, Take 1 tablet (100 mg total) by mouth at bedtime. (Patient not taking: Reported on 10/22/2018), Disp: 90 tablet, Rfl: 0 Medication Side Effects: none  Family Medical/ Social History: Changes? no  MENTAL HEALTH EXAM:  There were no vitals taken for this visit.There is no height or weight on file to calculate BMI.  General Appearance: Casual  Eye Contact:  Good  Speech:  Clear and Coherent  Volume:  Normal  Mood:  Depressed  Affect:  Appropriate  Thought Process:  Linear  Orientation:  Full (Time, Place, and Person)  Thought Content: hallucinations and paranoia  Suicidal Thoughts:  No  Homicidal Thoughts:  No  Memory:  WNL  Judgement:  Good  Insight:  Good  Psychomotor Activity:  Normal  Concentration:  Concentration: Good  Recall:  Good  Fund of Knowledge: Good  Language: Good  Assets:  Desire for Improvement  ADL's:  Intact  Cognition: WNL  Prognosis:  Good    DIAGNOSES: No diagnosis found.  Receiving Psychotherapy: No    RECOMMENDATIONS: We will continue her medications and I will take over writing  of Flexeril.  Medications are BuSpar 10 mg 3 times daily.  Valium 5 mg twice daily.  Luvox 100 mg a day.  Zyprexa 20 mg a day.  Vistaril 25 mg 2-3 times a day.  Flexeril 10 mg 3 times daily.  Minipress 2 mg at bed.  Gabapentin 800 mg 1 in the morning 1 at lunch 2 at supper.  Ingrezza 80 mg.  Trazodone 100 mg a day increase it to 150.  Check 3 weeks  Comer Locket, PA-C

## 2018-11-24 ENCOUNTER — Ambulatory Visit: Payer: BC Managed Care – PPO | Admitting: Psychiatry

## 2018-12-03 ENCOUNTER — Ambulatory Visit: Payer: BC Managed Care – PPO | Admitting: Psychiatry

## 2018-12-27 ENCOUNTER — Other Ambulatory Visit: Payer: Self-pay | Admitting: Psychiatry

## 2018-12-28 NOTE — Telephone Encounter (Signed)
Needs to schedule follow up;

## 2018-12-29 ENCOUNTER — Ambulatory Visit: Payer: BC Managed Care – PPO | Admitting: Psychiatry

## 2019-01-05 ENCOUNTER — Ambulatory Visit: Payer: BC Managed Care – PPO | Admitting: Psychiatry

## 2019-01-11 ENCOUNTER — Ambulatory Visit: Payer: BC Managed Care – PPO | Admitting: Psychiatry

## 2019-01-11 DIAGNOSIS — F3162 Bipolar disorder, current episode mixed, moderate: Secondary | ICD-10-CM | POA: Diagnosis not present

## 2019-01-11 NOTE — Progress Notes (Signed)
[facility]  Patient ID: Morgan [name],  MRN: [id number]  PCP: [place]  Date of Evaluation: 01/11/2019 Time spent:30 minutes  Chief Complaint:   HISTORY/CURRENT STATUS: HPI patient last seen 11/05/2019.  At the time she was still having depression and anxiety. Patient continues with depression that comes and goes.  No suicidal thoughts.  Anxiety comes and goes also.  She has panic attacks. No manic symptoms.   She still is having auditory hallucinations she hears things inside her head and outside her head, this is better .no delusions, and paranoia still feels like someone is following.  Individual Medical History/ Review of Systems: Changes? :No   Allergies: Clindamycin/lincomycin; Nsaids; Oxycodone; and Percocet [oxycodone-acetaminophen]  Current Medications:  Current Outpatient Medications:  .  busPIRone (BUSPAR) 10 MG tablet, Take 1 tablet (10 mg total) by mouth 3 (three) times daily., Disp: 270 tablet, Rfl: 0 .  cyclobenzaprine (FLEXERIL) 10 MG tablet, Take 1 tablet (10 mg total) by mouth 3 (three) times daily as needed for muscle spasms., Disp: 270 tablet, Rfl: 0 .  diazepam (VALIUM) 5 MG tablet, Take 1 tablet (5 mg total) by mouth 2 (two) times daily., Disp: 180 tablet, Rfl: 0 .  fluvoxaMINE (LUVOX) 100 MG tablet, 1 hs, Disp: 90 tablet, Rfl: 0 .  gabapentin (NEURONTIN) 800 MG tablet, 1 in am, 1 at lunch, 2 hs, Disp: 360 tablet, Rfl: 0 .  hydrOXYzine (ATARAX/VISTARIL) 25 MG tablet, Take 1 tablet (25 mg total) by mouth 2 (two) times daily., Disp: 180 tablet, Rfl: 0 .  INGREZZA 80 MG CAPS, Take 80 mg by mouth daily., Disp: 30 capsule, Rfl: 3 .  OLANZapine (ZYPREXA) 20 MG tablet, TAKE 1 TABLET(20 MG) BY MOUTH AT BEDTIME, Disp: 90 tablet, Rfl: 0 .  prazosin (MINIPRESS) 2 MG capsule, Take 1 capsule (2 mg total) by mouth at bedtime., Disp: 90 capsule, Rfl: 0 .  traZODone (DESYREL) 100 MG tablet, 1 and 1/2 tabs hs, Disp: 135 tablet, Rfl: 0 .   AIMOVIG 70 MG/ML SOAJ, Inject 70 mg into the muscle every 30 (thirty) days. , Disp: , Rfl: 11 .  AMITIZA 24 MCG capsule, Take 24 mcg by mouth 2 (two) times daily with a meal. , Disp: , Rfl:  .  diclofenac sodium (VOLTAREN) 1 % GEL, Apply 4 g topically 4 (four) times daily., Disp: 1 Tube, Rfl: 0 .  fluticasone (FLONASE) 50 MCG/ACT nasal spray, Place 1 spray into both nostrils daily as needed for allergies or rhinitis., Disp: , Rfl:  .  ipratropium-albuterol (DUONEB) 0.5-2.5 (3) MG/3ML SOLN, Inhale 3 mLs into the lungs every 6 (six) hours as needed (SOB, wheezing). , Disp: , Rfl:  .  ketorolac (TORADOL) 10 MG tablet, TK 1 T PO  Q 6 H PRF P, Disp: , Rfl: 0 .  levothyroxine (LEVOTHROID) 88 MCG tablet, Take 88 mcg by mouth daily before breakfast., Disp: , Rfl:  .  levothyroxine (SYNTHROID, LEVOTHROID) 100 MCG tablet, Take 100 mcg by mouth daily before breakfast. , Disp: , Rfl:  .  linaclotide (LINZESS) 290 MCG CAPS capsule, Take 290 mcg by mouth daily before breakfast., Disp: , Rfl:  .  LYRICA 100 MG capsule, , Disp: , Rfl:  .  montelukast (SINGULAIR) 10 MG tablet, Take 10 mg by mouth at bedtime. , Disp: , Rfl:  .  montelukast (SINGULAIR) 10 MG tablet, TAKE 1 TABLET BY MOUTH NIGHTLY, Disp: , Rfl:  .  Multiple Vitamin (MULTIVITAMIN WITH MINERALS) TABS tablet, Take  1 tablet by mouth daily., Disp: , Rfl:  .  omeprazole (PRILOSEC) 40 MG capsule, Take 40 mg by mouth daily. , Disp: , Rfl:  .  ondansetron (ZOFRAN ODT) 4 MG disintegrating tablet, Take 4 mg by mouth every 8 (eight) hours as needed for nausea. , Disp: , Rfl:  .  predniSONE (DELTASONE) 10 MG tablet, Take 10 mg by mouth daily with breakfast. , Disp: , Rfl:  .  promethazine (PHENERGAN) 12.5 MG tablet, Take 12.5 mg by mouth every 6 (six) hours as needed for nausea or vomiting. , Disp: , Rfl:  .  topiramate (TOPAMAX) 100 MG tablet, TAKE 1 TABLET(100 MG) BY MOUTH TWICE DAILY, Disp: 180 tablet, Rfl: 0 .  topiramate (TOPAMAX) 50 MG tablet, Take 1 tablet  (50 mg total) by mouth 2 (two) times daily., Disp: 180 tablet, Rfl: 0 .  traMADol (ULTRAM) 50 MG tablet, Take 50 mg by mouth every 6 (six) hours as needed for moderate pain (kidney stones)., Disp: , Rfl:  Medication Side Effects: no  Family Medical/ Social History: Changes? no  MENTAL HEALTH EXAM:  There were no vitals taken for this visit.There is no height or weight on file to calculate BMI.  General Appearance: Casual  Eye Contact:  Good  Speech:  Clear and Coherent  Volume:  Normal  Mood:  Depressed  Affect:  Appropriate  Thought Process:  Linear  Orientation:  Full (Time, Place, and Person)  Thought Content: Logical   Suicidal Thoughts:  No  Homicidal Thoughts:  No  Memory:  WNL  Judgement:  Fair  Insight:  Fair  Psychomotor Activity:  Normal  Concentration:  Concentration: Good  Recall:  Good  Fund of Knowledge: Good  Language: Good  Assets:  Desire for Improvement  ADL's:  Impaired  Cognition: WNL  Prognosis:  fair    DIAGNOSES: No diagnosis found.  Receiving Psychotherapy: No    RECOMMENDATIONS: We will increase Ms. proportioned Luvox.  Currently is 100 mg a day.  I am increasing it to 150 mg a day.  She is to continue her Zyprexa 20 mg a day.  He is to continue Topamax 50 mg twice a day and 100 mg twice a day.  BuSpar 10 mg 3 times daily.  Valium 5 mg twice daily.  Strattera 25 mg 2-3 a day.  Flexeril 10 mg 3 times daily, Minipress 2 mg at bedtime.  Gabapentin 800 mg 1 in the morning 1 at lunch and 2 at supper Ingrezza 80 mg 1 a day.  Trazodone 150 a day.  Zoloft 100 mg a day. We will continue to follow the patient hopefully she will have improvement with the Zyprexa with increasing the Luvox.  We will caution her with gabapentin and Lyrica.  See her again in 1 month   Comer Locket, [place]

## 2019-02-08 ENCOUNTER — Ambulatory Visit: Payer: BC Managed Care – PPO | Admitting: Psychiatry

## 2019-03-02 ENCOUNTER — Ambulatory Visit: Payer: BC Managed Care – PPO | Admitting: Psychiatry

## 2019-03-16 ENCOUNTER — Encounter: Payer: Self-pay | Admitting: Physician Assistant

## 2019-03-16 ENCOUNTER — Other Ambulatory Visit: Payer: Self-pay

## 2019-03-16 ENCOUNTER — Ambulatory Visit (INDEPENDENT_AMBULATORY_CARE_PROVIDER_SITE_OTHER): Payer: BC Managed Care – PPO | Admitting: Physician Assistant

## 2019-03-16 DIAGNOSIS — F411 Generalized anxiety disorder: Secondary | ICD-10-CM

## 2019-03-16 DIAGNOSIS — F4310 Post-traumatic stress disorder, unspecified: Secondary | ICD-10-CM

## 2019-03-16 DIAGNOSIS — F3162 Bipolar disorder, current episode mixed, moderate: Secondary | ICD-10-CM | POA: Diagnosis not present

## 2019-03-16 DIAGNOSIS — G4700 Insomnia, unspecified: Secondary | ICD-10-CM

## 2019-03-16 MED ORDER — TRAZODONE HCL 100 MG PO TABS: ORAL_TABLET | ORAL | 0 refills | Status: DC

## 2019-03-16 MED ORDER — BUSPIRONE HCL 10 MG PO TABS: 15.0000 mg | ORAL_TABLET | Freq: Three times a day (TID) | ORAL | 0 refills | Status: DC

## 2019-03-16 MED ORDER — FLUVOXAMINE MALEATE 100 MG PO TABS: 200.0000 mg | ORAL_TABLET | Freq: Every day | ORAL | 0 refills | Status: DC

## 2019-03-16 NOTE — Progress Notes (Signed)
[facility]  Patient ID: Morgan [name],  MRN: [id number]  PCP: [place]  Date of Evaluation: 03/16/2019 Time spent:25 minutes  Chief Complaint:  Chief Complaint    Follow-up     Virtual Visit via Telephone Note  I connected with patient by a video enabled telemedicine application or telephone, with their informed consent, and verified patient privacy and that I am speaking with the correct person using two identifiers.  I am private, in my home and the patient is home.  I discussed the limitations, risks, security and privacy concerns of performing an evaluation and management service by telephone and the availability of in person appointments. I also discussed with the patient that there may be a patient responsible charge related to this service. The patient expressed understanding and agreed to proceed.   I discussed the assessment and treatment plan with the patient. The patient was provided an opportunity to ask questions and all were answered. The patient agreed with the plan and demonstrated an understanding of the instructions.   The patient was advised to call back or seek an in-person evaluation if the symptoms worsen or if the condition fails to improve as anticipated.  I provided 25 minutes of non-face-to-face time during this encounter.  HISTORY/CURRENT STATUS: HPI for routine follow-up.  Mischa is being transferred to my care from [facility] [place] from our practice who recently passed away.  Reports that she is still anxious at times.  Feels a little paranoid when she goes out.  Feels like people are watching her.  Of course she has not been out much now because of the coronavirus pandemic and shelter in place.  She just really feels uncomfortable and anxious out in public.  At the last visit with Lissa Hoard on 01/11/2019, her Luvox was increased to 150 mg.  He states it really has not helped the depressive symptoms or the anxiety very  much.  She still feels down and anxious as described above.  Her energy and motivation are not what they used to be.  She is able to enjoy some things however it is difficult with her disabilities.  She had a traumatic brain injury several years ago and is trying to get disability.  She denies suicidal or homicidal thoughts.   Patient denies increased energy with decreased need for sleep, no increased talkativeness, no racing thoughts, no impulsivity or risky behaviors, no increased spending, no increased libido, no grandiosity.  Denies muscle or joint pain, stiffness, or dystonia.Denies muscle or joint pain, stiffness, or dystonia.Denies dizziness, syncope, seizures, numbness, tingling, tremor, tics, unsteady gait, slurred speech, confusion.   Individual Medical History/ Review of Systems: Changes? :No   Allergies: Clindamycin/lincomycin; Nsaids; Oxycodone; and Percocet [oxycodone-acetaminophen]  Current Medications:  Current Outpatient Medications:  .  AIMOVIG 70 MG/ML SOAJ, Inject 70 mg into the muscle every 30 (thirty) days. , Disp: , Rfl: 11 .  AMITIZA 24 MCG capsule, Take 24 mcg by mouth 2 (two) times daily with a meal. , Disp: , Rfl:  .  cyclobenzaprine (FLEXERIL) 10 MG tablet, Take 1 tablet (10 mg total) by mouth 3 (three) times daily as needed for muscle spasms., Disp: 270 tablet, Rfl: 0 .  diazepam (VALIUM) 5 MG tablet, Take 1 tablet (5 mg total) by mouth 2 (two) times daily., Disp: 180 tablet, Rfl: 0 .  diclofenac sodium (VOLTAREN) 1 % GEL, Apply 4 g topically 4 (four) times daily., Disp: 1 Tube, Rfl: 0 .  fluticasone (FLONASE)  50 MCG/ACT nasal spray, Place 1 spray into both nostrils daily as needed for allergies or rhinitis., Disp: , Rfl:  .  fluvoxaMINE (LUVOX) 100 MG tablet, Take 2 tablets (200 mg total) by mouth at bedtime., Disp: 180 tablet, Rfl: 0 .  gabapentin (NEURONTIN) 800 MG tablet, 1 in am, 1 at lunch, 2 hs, Disp: 360 tablet, Rfl: 0 .  hydrOXYzine (ATARAX/VISTARIL) 25 MG  tablet, Take 1 tablet (25 mg total) by mouth 2 (two) times daily., Disp: 180 tablet, Rfl: 0 .  INGREZZA 80 MG CAPS, Take 80 mg by mouth daily., Disp: 30 capsule, Rfl: 3 .  ipratropium-albuterol (DUONEB) 0.5-2.5 (3) MG/3ML SOLN, Inhale 3 mLs into the lungs every 6 (six) hours as needed (SOB, wheezing). , Disp: , Rfl:  .  ketorolac (TORADOL) 10 MG tablet, TK 1 T PO  Q 6 H PRF P, Disp: , Rfl: 0 .  levothyroxine (SYNTHROID, LEVOTHROID) 100 MCG tablet, Take 100 mcg by mouth daily before breakfast. , Disp: , Rfl:  .  linaclotide (LINZESS) 290 MCG CAPS capsule, Take 290 mcg by mouth daily before breakfast., Disp: , Rfl:  .  LYRICA 100 MG capsule, , Disp: , Rfl:  .  montelukast (SINGULAIR) 10 MG tablet, Take 10 mg by mouth at bedtime. , Disp: , Rfl:  .  montelukast (SINGULAIR) 10 MG tablet, TAKE 1 TABLET BY MOUTH NIGHTLY, Disp: , Rfl:  .  Multiple Vitamin (MULTIVITAMIN WITH MINERALS) TABS tablet, Take 1 tablet by mouth daily., Disp: , Rfl:  .  OLANZapine (ZYPREXA) 20 MG tablet, TAKE 1 TABLET(20 MG) BY MOUTH AT BEDTIME, Disp: 90 tablet, Rfl: 0 .  omeprazole (PRILOSEC) 40 MG capsule, Take 40 mg by mouth daily. , Disp: , Rfl:  .  ondansetron (ZOFRAN ODT) 4 MG disintegrating tablet, Take 4 mg by mouth every 8 (eight) hours as needed for nausea. , Disp: , Rfl:  .  prazosin (MINIPRESS) 2 MG capsule, Take 1 capsule (2 mg total) by mouth at bedtime., Disp: 90 capsule, Rfl: 0 .  predniSONE (DELTASONE) 10 MG tablet, Take 10 mg by mouth daily with breakfast. , Disp: , Rfl:  .  promethazine (PHENERGAN) 12.5 MG tablet, Take 12.5 mg by mouth every 6 (six) hours as needed for nausea or vomiting. , Disp: , Rfl:  .  topiramate (TOPAMAX) 100 MG tablet, TAKE 1 TABLET(100 MG) BY MOUTH TWICE DAILY, Disp: 180 tablet, Rfl: 0 .  topiramate (TOPAMAX) 50 MG tablet, Take 1 tablet (50 mg total) by mouth 2 (two) times daily., Disp: 180 tablet, Rfl: 0 .  traMADol (ULTRAM) 50 MG tablet, Take 50 mg by mouth every 6 (six) hours as needed  for moderate pain (kidney stones)., Disp: , Rfl:  .  traZODone (DESYREL) 100 MG tablet, 1 and 1/2 tabs hs, Disp: 135 tablet, Rfl: 0 .  busPIRone (BUSPAR) 10 MG tablet, Take 1.5 tablets (15 mg total) by mouth 3 (three) times daily., Disp: 135 tablet, Rfl: 0 .  levothyroxine (LEVOTHROID) 88 MCG tablet, Take 88 mcg by mouth daily before breakfast., Disp: , Rfl:  Medication Side Effects: none  Family Medical/ Social History: Changes? Yes shelter in place due to coronavirus  [place]:   There were no vitals taken for this visit.There is no height or weight on file to calculate BMI.  General Appearance: phone visit unable to assess  Eye Contact:  unable to assess  Speech:  Clear and Coherent  Volume:  Normal  Mood:  Depressed  Affect:  unable  to assess  Thought Process:  Goal Directed  Orientation:  Full (Time, Place, and Person)  Thought Content: Logical   Suicidal Thoughts:  No  Homicidal Thoughts:  No  Memory:  WNL  Judgement:  Good  Insight:  Good  Psychomotor Activity:  unable to assess  Concentration:  Concentration: Fair and Attention Span: Fair  Recall:  Good  Fund of Knowledge: Good  Language: Good  Assets:  Desire for Improvement  ADL's:  Intact  Cognition: WNL  Prognosis:  Good    DIAGNOSES:    ICD-10-CM   1. Bipolar 1 disorder, mixed, moderate (HCC) F31.62   2. PTSD (post-traumatic stress disorder) F43.10   3. Generalized anxiety disorder F41.1   4. Insomnia, unspecified type G47.00     Receiving Psychotherapy: No    RECOMMENDATIONS: I spent 25 minutes with her and at least 50% of that time was spent in counseling concerning her diagnoses and treatment options. Increase Luvox to 200 mg/day.  This should help with the depression and anxiety prevention as well. Increase BuSpar to 15 mg 3 times daily. Continue Valium 5 mg twice daily as needed. Continue gabapentin 800 mg 1 every morning, 1 at lunch, and 2 at bedtime. Continue hydroxyzine 25 mg 2  nightly. Continuing Ingrezza 80 mg daily. Continue Zyprexa 20 mg p.o. daily. Continue prazosin 2 mg nightly. Continue Topamax 100 mg 1 twice daily.  Along with 50 mg twice daily. Continue trazodone 100 mg, 1.5 p.o. nightly as needed. Recommend psychotherapy. Return in 4 to 6 weeks.  Donnal Moat, PA-C   This record has been created using [employer].  Chart creation errors have been sought, but may not always have been located and corrected. Such creation errors do not reflect on the standard of medical care.

## 2019-03-30 ENCOUNTER — Ambulatory Visit: Payer: BC Managed Care – PPO | Admitting: Psychiatry

## 2019-04-13 ENCOUNTER — Ambulatory Visit: Payer: BC Managed Care – PPO | Admitting: Physician Assistant

## 2019-04-19 ENCOUNTER — Encounter: Payer: Self-pay | Admitting: Physician Assistant

## 2019-04-19 ENCOUNTER — Other Ambulatory Visit: Payer: Self-pay

## 2019-04-19 ENCOUNTER — Ambulatory Visit: Payer: BC Managed Care – PPO | Admitting: Physician Assistant

## 2019-04-19 DIAGNOSIS — R413 Other amnesia: Secondary | ICD-10-CM

## 2019-04-19 DIAGNOSIS — F4310 Post-traumatic stress disorder, unspecified: Secondary | ICD-10-CM | POA: Diagnosis not present

## 2019-04-19 DIAGNOSIS — F411 Generalized anxiety disorder: Secondary | ICD-10-CM

## 2019-04-19 DIAGNOSIS — G2401 Drug induced subacute dyskinesia: Secondary | ICD-10-CM

## 2019-04-19 DIAGNOSIS — F3130 Bipolar disorder, current episode depressed, mild or moderate severity, unspecified: Secondary | ICD-10-CM | POA: Diagnosis not present

## 2019-04-19 MED ORDER — BUSPIRONE HCL 30 MG PO TABS: 30.0000 mg | ORAL_TABLET | Freq: Two times a day (BID) | ORAL | 1 refills | Status: DC

## 2019-04-19 MED ORDER — GABAPENTIN 800 MG PO TABS: ORAL_TABLET | ORAL | 0 refills | Status: DC

## 2019-04-19 NOTE — Progress Notes (Signed)
[facility]  Patient ID: Morgan [name],  MRN: [id number]  PCP: [facility]  Date of Evaluation: 04/19/19 Time spent:25 minutes  Chief Complaint:  Chief Complaint    Post-Traumatic Stress Disorder; Head Injury       HISTORY/CURRENT STATUS: HPI For med check after increase of meds.  Also wants to discuss disability hearing that is coming up.  On 03/16/19, we increased the Luvox and and Buspar.  States she feels better. Maybe about 40%.  Not really depressed right now.  She is able to enjoy things.  Her energy and motivation are good for her.  She is not isolating any more than she has to due to the coronavirus.  She does not cry easily.  She is still having some anxiety however.  She feels that it is more related to what is going on in the country, with the coronavirus pandemic as well as protests, rioting, and looting.  She sleeps well.  Patient denies increased energy with decreased need for sleep, no increased talkativeness, no racing thoughts, no impulsivity or risky behaviors, no increased spending, no increased libido, no grandiosity.  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  That inmate was in a lot of pain and the patient feels that is what triggered her to pass out and hit her head.  She has had memory problems since then.  She sees a neurologist regularly for that.  She forgets things every day.  Her husband has set alarms on her phone to remind her to eat, bathe, brush her hair, things like that.  He has to help her in the shower or to wash her hair because she has trouble standing up for so long to get that done.  She also has trouble lifting her arms above her head to wash her hair or style it.  Prior to the head injury, she had no memory problems.  She has applied for disability for this and has a hearing coming up June 19th.  Denies dizziness, syncope, seizures,  numbness, tingling, tremor, tics, unsteady gait, slurred speech, confusion. Denies muscle or joint pain, stiffness, or dystonia.   Individual Medical History/ Review of Systems: Changes? :No    Past medications for mental health diagnoses include: Zoloft, trazodone, gabapentin, Risperdal, hydroxyzine, prazosin, nortriptyline, Adderall, Rexulti, Topamax, Latuda, Ingrezza lithium, Valium, clozapine, BuSpar, Zyprexa,  Allergies: Clindamycin/lincomycin; Nsaids; Oxycodone; and Percocet [oxycodone-acetaminophen]  Current Medications:  Current Outpatient Medications:  .  AIMOVIG 70 MG/ML SOAJ, Inject 70 mg into the muscle every 30 (thirty) days. , Disp: , Rfl: 11 .  AMITIZA 24 MCG capsule, Take 24 mcg by mouth 2 (two) times daily with a meal. , Disp: , Rfl:  .  cyclobenzaprine (FLEXERIL) 10 MG tablet, Take 1 tablet (10 mg total) by mouth 3 (three) times daily as needed for muscle spasms., Disp: 270 tablet, Rfl: 0 .  diazepam (VALIUM) 5 MG tablet, Take 1 tablet (5 mg total) by mouth 2 (two) times daily., Disp: 180 tablet, Rfl: 0 .  diclofenac sodium (VOLTAREN) 1 % GEL, Apply 4 g topically 4 (four) times daily., Disp: 1 Tube, Rfl: 0 .  fluticasone (FLONASE) 50 MCG/ACT nasal spray, Place 1 spray into both nostrils daily as needed for allergies or rhinitis., Disp: , Rfl:  .  fluvoxaMINE (LUVOX) 100 MG tablet, Take 2 tablets (200 mg total) by mouth at bedtime., Disp: 180 tablet,  Rfl: 0 .  gabapentin (NEURONTIN) 800 MG tablet, 1 in am, 1 at lunch, 2 hs, Disp: 360 tablet, Rfl: 0 .  hydrOXYzine (ATARAX/VISTARIL) 25 MG tablet, Take 1 tablet (25 mg total) by mouth 2 (two) times daily., Disp: 180 tablet, Rfl: 0 .  INGREZZA 80 MG CAPS, Take 80 mg by mouth daily., Disp: 30 capsule, Rfl: 3 .  ipratropium-albuterol (DUONEB) 0.5-2.5 (3) MG/3ML SOLN, Inhale 3 mLs into the lungs every 6 (six) hours as needed (SOB, wheezing). , Disp: , Rfl:  .  ketorolac (TORADOL) 10 MG tablet, TK 1 T PO  Q 6 H PRF P, Disp: , Rfl: 0 .   levothyroxine (LEVOTHROID) 88 MCG tablet, Take 88 mcg by mouth daily before breakfast., Disp: , Rfl:  .  levothyroxine (SYNTHROID, LEVOTHROID) 100 MCG tablet, Take 100 mcg by mouth daily before breakfast. , Disp: , Rfl:  .  linaclotide (LINZESS) 290 MCG CAPS capsule, Take 290 mcg by mouth daily before breakfast., Disp: , Rfl:  .  LYRICA 100 MG capsule, , Disp: , Rfl:  .  montelukast (SINGULAIR) 10 MG tablet, Take 10 mg by mouth at bedtime. , Disp: , Rfl:  .  montelukast (SINGULAIR) 10 MG tablet, TAKE 1 TABLET BY MOUTH NIGHTLY, Disp: , Rfl:  .  Multiple Vitamin (MULTIVITAMIN WITH MINERALS) TABS tablet, Take 1 tablet by mouth daily., Disp: , Rfl:  .  OLANZapine (ZYPREXA) 20 MG tablet, TAKE 1 TABLET(20 MG) BY MOUTH AT BEDTIME, Disp: 90 tablet, Rfl: 0 .  omeprazole (PRILOSEC) 40 MG capsule, Take 40 mg by mouth daily. , Disp: , Rfl:  .  ondansetron (ZOFRAN ODT) 4 MG disintegrating tablet, Take 4 mg by mouth every 8 (eight) hours as needed for nausea. , Disp: , Rfl:  .  prazosin (MINIPRESS) 2 MG capsule, Take 1 capsule (2 mg total) by mouth at bedtime., Disp: 90 capsule, Rfl: 0 .  predniSONE (DELTASONE) 10 MG tablet, Take 10 mg by mouth daily with breakfast. , Disp: , Rfl:  .  promethazine (PHENERGAN) 12.5 MG tablet, Take 12.5 mg by mouth every 6 (six) hours as needed for nausea or vomiting. , Disp: , Rfl:  .  topiramate (TOPAMAX) 100 MG tablet, TAKE 1 TABLET(100 MG) BY MOUTH TWICE DAILY, Disp: 180 tablet, Rfl: 0 .  busPIRone (BUSPAR) 30 MG tablet, Take 1 tablet (30 mg total) by mouth 2 (two) times daily., Disp: 60 tablet, Rfl: 1 .  topiramate (TOPAMAX) 50 MG tablet, Take 1 tablet (50 mg total) by mouth 2 (two) times daily., Disp: 180 tablet, Rfl: 0 .  traMADol (ULTRAM) 50 MG tablet, Take 50 mg by mouth every 6 (six) hours as needed for moderate pain (kidney stones)., Disp: , Rfl:  .  traZODone (DESYREL) 100 MG tablet, 1 and 1/2 tabs hs, Disp: 135 tablet, Rfl: 0 Medication Side Effects: none  Family  Medical/ Social History: Changes? No  MENTAL HEALTH EXAM:  There were no vitals taken for this visit.There is no height or weight on file to calculate BMI.  General Appearance: Casual  Eye Contact:  Good  Speech:  Clear and Coherent  Volume:  Normal  Mood:  Euthymic  Affect:  Appropriate  Thought Process:  Goal Directed  Orientation:  Full (Time, Place, and Person)  Thought Content: Logical   Suicidal Thoughts:  No  Homicidal Thoughts:  No  Memory:  Recent;   Poor Remote;   Poor  Judgement:  Good  Insight:  Good  Psychomotor Activity:  Normal  Concentration:  Concentration: Fair and Attention Span: Fair  Recall:  Good  Fund of Knowledge: Good  Language: Good  Assets:  Desire for Improvement  ADL's:  Intact  Cognition: WNL  Prognosis:  Good  See Mini-Mental status exam.  Score of 24  DIAGNOSES:    ICD-10-CM   1. Bipolar I disorder, most recent episode depressed ([place]) F31.30   2. PTSD (post-traumatic stress disorder) F43.10   3. Memory loss R41.3   4. Generalized anxiety disorder F41.1   5. Tardive dyskinesia G24.01     Receiving Psychotherapy: No    RECOMMENDATIONS: I spent 25 minutes with her in counseling concerning diagnosis and treatment as well as performing Mini-Mental Status exam. Increase BuSpar to 30 mg twice daily. Continue Valium 5 mg 1 twice daily as needed. Continue Luvox 200 mg nightly. Continue gabapentin 800 mg every morning, 1 at lunch, and 2 at bedtime. Continue hydroxyzine 25 mg twice daily as needed. Continue Ingrezza 80 mg daily. Continue Zyprexa 20 mg daily. Continue prazosin 2 mg nightly. Continue trazodone 100 mg 1-2 nightly as needed sleep Return in 4 weeks.  Donnal Moat, PA-C   This record has been created using [employer].  Chart creation errors have been sought, but may not always have been located and corrected. Such creation errors do not reflect on the standard of medical care.

## 2019-04-20 DIAGNOSIS — Z0289 Encounter for other administrative examinations: Secondary | ICD-10-CM

## 2019-05-17 ENCOUNTER — Other Ambulatory Visit: Payer: Self-pay

## 2019-05-17 ENCOUNTER — Ambulatory Visit: Payer: BC Managed Care – PPO | Admitting: Physician Assistant

## 2019-06-04 ENCOUNTER — Other Ambulatory Visit: Payer: Self-pay

## 2019-06-04 MED ORDER — ADULT MULTIVITAMIN W/MINERALS CH: 1.0000 | ORAL_TABLET | Freq: Every day | ORAL | 0 refills | Status: DC

## 2019-06-04 MED ORDER — HYDROXYZINE HCL 25 MG PO TABS: 25.0000 mg | ORAL_TABLET | Freq: Two times a day (BID) | ORAL | 0 refills | Status: DC

## 2019-06-04 MED ORDER — FLUVOXAMINE MALEATE 100 MG PO TABS: 200.0000 mg | ORAL_TABLET | Freq: Every day | ORAL | 0 refills | Status: DC

## 2019-06-04 MED ORDER — DIAZEPAM 5 MG PO TABS
5.0000 mg | ORAL_TABLET | Freq: Two times a day (BID) | ORAL | 0 refills | Status: DC
Start: 2019-06-04 — End: 2019-07-27

## 2019-06-04 MED ORDER — TOPIRAMATE 100 MG PO TABS: 150.0000 mg | ORAL_TABLET | Freq: Two times a day (BID) | ORAL | 0 refills | Status: DC

## 2019-06-04 MED ORDER — MULTI VITAMIN PO TABS: 1.0000 | ORAL_TABLET | Freq: Every day | ORAL | 0 refills | Status: DC

## 2019-06-04 NOTE — Telephone Encounter (Signed)
I overlooked the Topamax in my note.  Yes, she is supposed to be on that so ok to RF.  Not on Sertraline, only the Luvox and it should be 200mg .  I'll send the Valium.  Thanks for always double-checking!

## 2019-06-16 ENCOUNTER — Ambulatory Visit: Payer: BC Managed Care – PPO | Admitting: Physician Assistant

## 2019-07-09 ENCOUNTER — Telehealth: Payer: Self-pay

## 2019-07-09 NOTE — Telephone Encounter (Signed)
Morgan [name], if for any reason this can't be done, can the drug co supply samples in the interim?  I want to make sure she's covered.

## 2019-07-09 NOTE — Telephone Encounter (Signed)
Patient's prior authorization for Ingrezza 80 mg will be expiring on 07/29/2019, an AIMS scale or a scale that shows improvement since baseline will need to be submitted as well for a renewal. Patient does have an appt with Donnal Moat, PA-C on 07/20/2019, will notify provider to complete scale.

## 2019-07-12 NOTE — Telephone Encounter (Signed)
This shouldn't be an issue but we can make sure she has medication.

## 2019-07-13 ENCOUNTER — Ambulatory Visit: Payer: BC Managed Care – PPO | Admitting: Adult Health

## 2019-07-20 ENCOUNTER — Ambulatory Visit: Payer: BC Managed Care – PPO | Admitting: Physician Assistant

## 2019-07-27 ENCOUNTER — Encounter: Payer: Self-pay | Admitting: Adult Health

## 2019-07-27 ENCOUNTER — Ambulatory Visit (INDEPENDENT_AMBULATORY_CARE_PROVIDER_SITE_OTHER): Payer: BC Managed Care – PPO | Admitting: Adult Health

## 2019-07-27 DIAGNOSIS — F29 Unspecified psychosis not due to a substance or known physiological condition: Secondary | ICD-10-CM

## 2019-07-27 DIAGNOSIS — G4700 Insomnia, unspecified: Secondary | ICD-10-CM

## 2019-07-27 DIAGNOSIS — R413 Other amnesia: Secondary | ICD-10-CM

## 2019-07-27 DIAGNOSIS — F3130 Bipolar disorder, current episode depressed, mild or moderate severity, unspecified: Secondary | ICD-10-CM

## 2019-07-27 DIAGNOSIS — G8929 Other chronic pain: Secondary | ICD-10-CM | POA: Insufficient documentation

## 2019-07-27 DIAGNOSIS — F411 Generalized anxiety disorder: Secondary | ICD-10-CM | POA: Diagnosis not present

## 2019-07-27 DIAGNOSIS — F4310 Post-traumatic stress disorder, unspecified: Secondary | ICD-10-CM | POA: Diagnosis not present

## 2019-07-27 DIAGNOSIS — G2401 Drug induced subacute dyskinesia: Secondary | ICD-10-CM

## 2019-07-27 MED ORDER — INGREZZA 80 MG PO CAPS: 80.0000 mg | ORAL_CAPSULE | Freq: Every day | ORAL | 5 refills | Status: DC

## 2019-07-27 MED ORDER — DIAZEPAM 5 MG PO TABS: 5.0000 mg | ORAL_TABLET | Freq: Two times a day (BID) | ORAL | 2 refills | Status: DC

## 2019-07-27 MED ORDER — TRAZODONE HCL 100 MG PO TABS: ORAL_TABLET | ORAL | 1 refills | Status: DC

## 2019-07-27 MED ORDER — OLANZAPINE 20 MG PO TABS: ORAL_TABLET | ORAL | 1 refills | Status: DC

## 2019-07-27 MED ORDER — FLUVOXAMINE MALEATE 100 MG PO TABS: 200.0000 mg | ORAL_TABLET | Freq: Every day | ORAL | 1 refills | Status: DC

## 2019-07-27 MED ORDER — HYDROXYZINE HCL 25 MG PO TABS: 25.0000 mg | ORAL_TABLET | Freq: Two times a day (BID) | ORAL | 1 refills | Status: DC

## 2019-07-27 MED ORDER — GABAPENTIN 800 MG PO TABS: ORAL_TABLET | ORAL | 1 refills | Status: DC

## 2019-07-27 MED ORDER — BUSPIRONE HCL 30 MG PO TABS: 30.0000 mg | ORAL_TABLET | Freq: Two times a day (BID) | ORAL | 1 refills | Status: DC

## 2019-07-27 NOTE — Progress Notes (Signed)
Morgan [name]:[id number] 1971-05-20 48 y.o.  Virtual Visit via Telephone Note  I connected with pt on 07/27/19 at  2:30 PM EDT by telephone and verified that I am speaking with the correct person using two identifiers.   I discussed the limitations, risks, security and privacy concerns of performing an evaluation and management service by telephone and the availability of in person appointments. I also discussed with the patient that there may be a patient responsible charge related to this service. The patient expressed understanding and agreed to proceed.   I discussed the assessment and treatment plan with the patient. The patient was provided an opportunity to ask questions and all were answered. The patient agreed with the plan and demonstrated an understanding of the instructions.   The patient was advised to call back or seek an in-person evaluation if the symptoms worsen or if the condition fails to improve as anticipated.  I provided 30 minutes of non-face-to-face time during this encounter.  The patient was located at home.  The provider was located at [place].   Aloha Gell, NP   Subjective:   Patient ID:  Morgan [name] is a 48 y.o. (DOB Mar 31, 1971) female.  Chief Complaint: No chief complaint on file.   HPI Morgan [name] presents for follow-up of PTSD, psychosis, anxiety, Bipolar disorder, insomnia, memory loss and TD.   Describes mood today as "ok". Pleasant. Mood symptoms - denies depression, anxiety, and irritability. States "I'm having good and bad days". Feels like "everything is working ok for now". Stable interest and motivation. Taking medications as prescribed.  Energy levels stable. Active, does not have a regular exercise routine - "sometimes I do things". Disabled 2016.  Enjoys some usual interests and activities. Married - lives with husband and 66 year old daughter. Spending time with family. Mostly stays at home.  Appetite  adequate. Weight stable. Sleeps well most nights. Averages 4 to 5 hours.  Focus and concentration difficulties - "always have problems", "about normal". Completing tasks. Managing some aspects of household. Trying to help husband when she can.  Denies SI or HI. Denies AH or VH.   Past medications for mental health diagnoses include: Zoloft, trazodone, gabapentin, Risperdal, hydroxyzine, prazosin, nortriptyline, Adderall, Rexulti, Topamax, Latuda, Ingrezza lithium, Valium, clozapine, BuSpar, Zyprexa,  Review of Systems:  Review of Systems  Neurological: Positive for numbness. Negative for tremors and weakness.    Medications: I have reviewed the patient's current medications.  Current Outpatient Medications  Medication Sig Dispense Refill  . AIMOVIG 70 MG/ML SOAJ Inject 70 mg into the muscle every 30 (thirty) days.   11  . AMITIZA 24 MCG capsule Take 24 mcg by mouth 2 (two) times daily with a meal.     . busPIRone (BUSPAR) 30 MG tablet Take 1 tablet (30 mg total) by mouth 2 (two) times daily. 180 tablet 1  . cyclobenzaprine (FLEXERIL) 10 MG tablet Take 1 tablet (10 mg total) by mouth 3 (three) times daily as needed for muscle spasms. 270 tablet 0  . diazepam (VALIUM) 5 MG tablet Take 1 tablet (5 mg total) by mouth 2 (two) times daily. 60 tablet 2  . diclofenac sodium (VOLTAREN) 1 % GEL Apply 4 g topically 4 (four) times daily. 1 Tube 0  . fluticasone (FLONASE) 50 MCG/ACT nasal spray Place 1 spray into both nostrils daily as needed for allergies or rhinitis.    . fluvoxaMINE (LUVOX) 100 MG tablet Take 2 tablets (200 mg total) by mouth  at bedtime. 180 tablet 1  . gabapentin (NEURONTIN) 800 MG tablet 1 in am, 1 at lunch, 2 hs 360 tablet 1  . hydrOXYzine (ATARAX/VISTARIL) 25 MG tablet Take 1 tablet (25 mg total) by mouth 2 (two) times daily. 180 tablet 1  . INGREZZA 80 MG CAPS Take 80 mg by mouth daily. 30 capsule 5  . ipratropium-albuterol (DUONEB) 0.5-2.5 (3) MG/3ML SOLN Inhale 3 mLs into  the lungs every 6 (six) hours as needed (SOB, wheezing).     Marland Kitchen ketorolac (TORADOL) 10 MG tablet TK 1 T PO  Q 6 H PRF P  0  . levothyroxine (LEVOTHROID) 88 MCG tablet Take 88 mcg by mouth daily before breakfast.    . levothyroxine (SYNTHROID, LEVOTHROID) 100 MCG tablet Take 100 mcg by mouth daily before breakfast.     . linaclotide (LINZESS) 290 MCG CAPS capsule Take 290 mcg by mouth daily before breakfast.    . LYRICA 100 MG capsule     . montelukast (SINGULAIR) 10 MG tablet Take 10 mg by mouth at bedtime.     . montelukast (SINGULAIR) 10 MG tablet TAKE 1 TABLET BY MOUTH NIGHTLY    . Multiple Vitamin (MULTI VITAMIN) TABS Take 1 tablet by mouth daily. 90 tablet 0  . OLANZapine (ZYPREXA) 20 MG tablet TAKE 1 TABLET(20 MG) BY MOUTH AT BEDTIME 90 tablet 1  . omeprazole (PRILOSEC) 40 MG capsule Take 40 mg by mouth daily.     . ondansetron (ZOFRAN ODT) 4 MG disintegrating tablet Take 4 mg by mouth every 8 (eight) hours as needed for nausea.     . prazosin (MINIPRESS) 2 MG capsule Take 1 capsule (2 mg total) by mouth at bedtime. 90 capsule 0  . predniSONE (DELTASONE) 10 MG tablet Take 10 mg by mouth daily with breakfast.     . promethazine (PHENERGAN) 12.5 MG tablet Take 12.5 mg by mouth every 6 (six) hours as needed for nausea or vomiting.     . topiramate (TOPAMAX) 100 MG tablet Take 1.5 tablets (150 mg total) by mouth 2 (two) times daily. 270 tablet 0  . traMADol (ULTRAM) 50 MG tablet Take 50 mg by mouth every 6 (six) hours as needed for moderate pain (kidney stones).    . traZODone (DESYREL) 100 MG tablet 1 and 1/2 tabs hs 135 tablet 1   No current facility-administered medications for this visit.     Medication Side Effects: None  Allergies:  Allergies  Allergen Reactions  . Clindamycin/Lincomycin Hives  . Nsaids     Gastric Bypass  . Oxycodone Other (See Comments)    Makes her mean spirited.   Marland Kitchen Percocet [Oxycodone-Acetaminophen]     Past Medical History:  Diagnosis Date  . Acid  reflux   . Family history of adverse reaction to anesthesia    sister has PONV  . H. pylori infection    hx of , none now  . Headache    migraines, uses imitrex as needed  . Hematuria   . History of kidney stones   . Hypothyroidism   . PONV (postoperative nausea and vomiting)   . Renal calculi BILATERAL  . Right ureteral stone   . RLS (restless legs syndrome)   . TBI (traumatic brain injury) ([place]) 04/12/2015    Family History  Problem Relation Age of Onset  . Diabetes Mother   . Hypertension Mother   . Cancer Mother   . Hypertension Father   . COPD Father   . Deafness Father   .  Emphysema Father     Social History   Socioeconomic History  . Marital status: Married    Spouse name: Not on file  . Number of children: Not on file  . Years of education: Not on file  . Highest education level: Not on file  Occupational History  . Not on file  Social Needs  . Financial resource strain: Not on file  . Food insecurity    Worry: Not on file    Inability: Not on file  . Transportation needs    Medical: Not on file    Non-medical: Not on file  Tobacco Use  . Smoking status: Never Smoker  . Smokeless tobacco: Never Used  Substance and Sexual Activity  . Alcohol use: No  . Drug use: No  . Sexual activity: Not on file  Lifestyle  . Physical activity    Days per week: Not on file    Minutes per session: Not on file  . Stress: Not on file  Relationships  . Social Herbalist on phone: Not on file    Gets together: Not on file    Attends religious service: Not on file    Active member of club or organization: Not on file    Attends meetings of clubs or organizations: Not on file    Relationship status: Not on file  . Intimate partner violence    Fear of current or ex partner: Not on file    Emotionally abused: Not on file    Physically abused: Not on file    Forced sexual activity: Not on file  Other Topics Concern  . Not on file  Social History  Narrative  . Not on file    Past Medical History, Surgical history, Social history, and Family history were reviewed and updated as appropriate.   Please see review of systems for further details on the patient's review from today.   Objective:   Physical Exam:  There were no vitals taken for this visit.  Physical Exam Constitutional:      General: She is not in acute distress.    Appearance: She is well-developed.  Musculoskeletal:        General: No deformity.  Neurological:     Mental Status: She is alert and oriented to person, place, and time.     Coordination: Coordination normal (memory loss).  Psychiatric:        Attention and Perception: Attention and perception normal. She does not perceive auditory or visual hallucinations.        Mood and Affect: Mood normal. Mood is not anxious or depressed. Affect is not labile, blunt, angry or inappropriate.        Speech: Speech normal.        Behavior: Behavior normal.        Thought Content: Thought content normal. Thought content is not paranoid or delusional. Thought content does not include homicidal or suicidal ideation. Thought content does not include homicidal or suicidal plan.        Cognition and Memory: Cognition and memory normal.        Judgment: Judgment normal.     Comments: Insight intact     Lab Review:     Component Value Date/Time   NA 138 10/29/2017 2158   K 3.8 10/29/2017 2158   CL 104 10/29/2017 2158   CO2 28 10/29/2017 2158   GLUCOSE 118 (H) 10/29/2017 2158   BUN 12 10/29/2017 2158   CREATININE  0.87 10/29/2017 2158   CALCIUM 9.2 10/29/2017 2158   PROT 7.3 08/11/2016 1152   ALBUMIN 4.3 08/11/2016 1152   AST 30 08/11/2016 1152   ALT 33 08/11/2016 1152   ALKPHOS 93 08/11/2016 1152   BILITOT 0.7 08/11/2016 1152   GFRNONAA >60 10/29/2017 2158   GFRAA >60 10/29/2017 2158       Component Value Date/Time   WBC 3.9 08/21/2018 1118   WBC 6.5 10/29/2017 2158   RBC 4.16 08/21/2018 1118   RBC 4.60  10/29/2017 2158   HGB 10.1 (L) 08/21/2018 1118   HCT 31.0 (L) 08/21/2018 1118   PLT 168 08/21/2018 1118   MCV 75 (L) 08/21/2018 1118   MCH 24.3 (L) 08/21/2018 1118   MCH 26.7 10/29/2017 2158   MCHC 32.6 08/21/2018 1118   MCHC 32.8 10/29/2017 2158   RDW 15.8 (H) 08/21/2018 1118   LYMPHSABS 1.3 08/21/2018 1118   MONOABS 0.4 10/29/2017 2158   EOSABS 0.2 08/21/2018 1118   BASOSABS 0.0 08/21/2018 1118    No results found for: POCLITH, LITHIUM   No results found for: PHENYTOIN, PHENOBARB, VALPROATE, CBMZ   .res Assessment: Plan:    Plan:  Continue BuSpar to 30 mg twice daily. Continue Valium 5 mg 1 twice daily as needed. Continue Luvox 200 mg nightly. Continue gabapentin 800 mg every morning, 1 at lunch, and 2 at bedtime. - change to 400mg  on these Continue hydroxyzine 25 mg twice daily as needed. Continue Ingrezza 80 mg daily. Continue Zyprexa 20 mg daily. Continue prazosin 2 mg nightly. Continue trazodone 100 mg 1-2 nightly as needed sleep  Return in 3 months  Discussed potential benefits, risk, and side effects of benzodiazepines to include potential risk of tolerance and dependence, as well as possible drowsiness.  Advised patient not to drive if experiencing drowsiness and to take lowest possible effective dose to minimize risk of dependence and tolerance.  Discussed potential metabolic side effects associated with atypical antipsychotics, as well as potential risk for movement side effects. Advised pt to contact office if movement side effects occur.    Diagnoses and all orders for this visit:  Bipolar I disorder, most recent episode depressed ([place])  PTSD (post-traumatic stress disorder)  Generalized anxiety disorder  Memory loss  Tardive dyskinesia  Insomnia, unspecified type  Psychosis, unspecified psychosis type ([place])  Other orders -     busPIRone (BUSPAR) 30 MG tablet; Take 1 tablet (30 mg total) by mouth 2 (two) times daily. -     diazepam (VALIUM) 5  MG tablet; Take 1 tablet (5 mg total) by mouth 2 (two) times daily. -     fluvoxaMINE (LUVOX) 100 MG tablet; Take 2 tablets (200 mg total) by mouth at bedtime. -     hydrOXYzine (ATARAX/VISTARIL) 25 MG tablet; Take 1 tablet (25 mg total) by mouth 2 (two) times daily. -     INGREZZA 80 MG CAPS; Take 80 mg by mouth daily. -     OLANZapine (ZYPREXA) 20 MG tablet; TAKE 1 TABLET(20 MG) BY MOUTH AT BEDTIME -     traZODone (DESYREL) 100 MG tablet; 1 and 1/2 tabs hs -     gabapentin (NEURONTIN) 800 MG tablet; 1 in am, 1 at lunch, 2 hs    Please see After Visit Summary for patient specific instructions.  No future appointments.  No orders of the defined types were placed in this encounter.     -------------------------------

## 2019-09-23 ENCOUNTER — Other Ambulatory Visit: Payer: Self-pay

## 2019-09-23 MED ORDER — TOPIRAMATE 100 MG PO TABS: 150.0000 mg | ORAL_TABLET | Freq: Two times a day (BID) | ORAL | 1 refills | Status: DC

## 2019-09-23 MED ORDER — MULTI VITAMIN PO TABS: 1.0000 | ORAL_TABLET | Freq: Every day | ORAL | 1 refills | Status: DC

## 2019-09-24 ENCOUNTER — Other Ambulatory Visit: Payer: Self-pay

## 2019-09-24 MED ORDER — PRAZOSIN HCL 2 MG PO CAPS: 2.0000 mg | ORAL_CAPSULE | Freq: Every day | ORAL | 0 refills | Status: DC

## 2019-10-28 ENCOUNTER — Ambulatory Visit: Payer: BC Managed Care – PPO | Admitting: Physician Assistant

## 2019-11-04 ENCOUNTER — Other Ambulatory Visit: Payer: Self-pay

## 2019-11-04 MED ORDER — DIAZEPAM 5 MG PO TABS: 5.0000 mg | ORAL_TABLET | Freq: Two times a day (BID) | ORAL | 2 refills | Status: DC

## 2019-11-04 MED ORDER — PRAZOSIN HCL 2 MG PO CAPS: 2.0000 mg | ORAL_CAPSULE | Freq: Every day | ORAL | 0 refills | Status: DC

## 2019-12-08 ENCOUNTER — Ambulatory Visit (INDEPENDENT_AMBULATORY_CARE_PROVIDER_SITE_OTHER): Payer: Medicare Other | Admitting: Physician Assistant

## 2019-12-08 ENCOUNTER — Encounter: Payer: Self-pay | Admitting: Physician Assistant

## 2019-12-08 DIAGNOSIS — G2401 Drug induced subacute dyskinesia: Secondary | ICD-10-CM | POA: Diagnosis not present

## 2019-12-08 DIAGNOSIS — G4700 Insomnia, unspecified: Secondary | ICD-10-CM

## 2019-12-08 DIAGNOSIS — F411 Generalized anxiety disorder: Secondary | ICD-10-CM | POA: Diagnosis not present

## 2019-12-08 DIAGNOSIS — F3130 Bipolar disorder, current episode depressed, mild or moderate severity, unspecified: Secondary | ICD-10-CM

## 2019-12-08 DIAGNOSIS — F4310 Post-traumatic stress disorder, unspecified: Secondary | ICD-10-CM

## 2019-12-08 NOTE — Progress Notes (Signed)
[facility]  Patient ID: Morgan [name],  MRN: [id number]  PCP: [facility] [place]  Date of Evaluation: 12/08/2019 Time spent:20 minutes  Chief Complaint:  Chief Complaint    Anxiety; Depression; Insomnia; Follow-up     Virtual Visit via Telephone Note  I connected with patient by a video enabled telemedicine application or telephone, with their informed consent, and verified patient privacy and that I am speaking with the correct person using two identifiers.  I am private, in my office and the patient is home.   I discussed the limitations, risks, security and privacy concerns of performing an evaluation and management service by telephone and the availability of in person appointments. I also discussed with the patient that there may be a patient responsible charge related to this service. The patient expressed understanding and agreed to proceed.   I discussed the assessment and treatment plan with the patient. The patient was provided an opportunity to ask questions and all were answered. The patient agreed with the plan and demonstrated an understanding of the instructions.   The patient was advised to call back or seek an in-person evaluation if the symptoms worsen or if the condition fails to improve as anticipated.  I provided 20 minutes of non-face-to-face time during this encounter.  HISTORY/CURRENT STATUS: HPI For routine med check.  Overall, states she's doing ok for the most part.  Mood is good, with occasional "down days."  States is circumstantial most of the time because she would like to get out and do things but is not able to do to the coronavirus pandemic.  She does like to go outside and sit in the sun.  She does that as often as possible.  Sometimes she still has trouble sleeping even with the trazodone.  She only takes up to 150 mg total.  Energy and motivation are good.  Denies suicidal or homicidal thoughts.  Denies increased  energy with decreased need for sleep.  No increased libido or spending.  No impulsivity or risky behavior.  No grandiosity.  No increased irritability.  No hallucinations.  Husband is going back to work tonight after recovering from surgery. Pt dreads husband going back to work b/c she'll be home alone at night. She gets really anxious. But Valium does help.   Overall the anxiety is well treated.  She does take the Valium usually twice a day.  For the most part is generalized anxiety and not panic attacks.  She is very fearful of staying by herself at night though and is nervous about her husband going back to work.  States she might need to take the Valium more often due to that reason.  She is on Ingrezza for tardive dyskinesia.  States it still helps.  Denies any abnormal movement. Denies dizziness, syncope, seizures, numbness, tingling, tremor, tics, unsteady gait, slurred speech, confusion. Denies muscle or joint pain, stiffness, or dystonia.  Individual Medical History/ Review of Systems: Changes? :No   Allergies: Clindamycin/lincomycin, Nsaids, Oxycodone, and Percocet [oxycodone-acetaminophen]  Current Medications:  Current Outpatient Medications:  .  AIMOVIG 70 MG/ML SOAJ, Inject 70 mg into the muscle every 30 (thirty) days. , Disp: , Rfl: 11 .  AMITIZA 24 MCG capsule, Take 24 mcg by mouth 2 (two) times daily with a meal. , Disp: , Rfl:  .  busPIRone (BUSPAR) 30 MG tablet, Take 1 tablet (30 mg total) by mouth 2 (two) times daily., Disp: 180 tablet, Rfl: 1 .  cyclobenzaprine (FLEXERIL) 10  MG tablet, Take 1 tablet (10 mg total) by mouth 3 (three) times daily as needed for muscle spasms., Disp: 270 tablet, Rfl: 0 .  diazepam (VALIUM) 5 MG tablet, Take 1 tablet (5 mg total) by mouth 2 (two) times daily., Disp: 60 tablet, Rfl: 2 .  diclofenac sodium (VOLTAREN) 1 % GEL, Apply 4 g topically 4 (four) times daily., Disp: 1 Tube, Rfl: 0 .  fluticasone (FLONASE) 50 MCG/ACT nasal spray, Place 1 spray  into both nostrils daily as needed for allergies or rhinitis., Disp: , Rfl:  .  fluvoxaMINE (LUVOX) 100 MG tablet, Take 2 tablets (200 mg total) by mouth at bedtime., Disp: 180 tablet, Rfl: 1 .  gabapentin (NEURONTIN) 800 MG tablet, 1 in am, 1 at lunch, 2 hs (Patient taking differently: Take 400 mg by mouth. 2 in am, 2 at lunch, 4 hs), Disp: 360 tablet, Rfl: 1 .  hydrOXYzine (ATARAX/VISTARIL) 25 MG tablet, Take 1 tablet (25 mg total) by mouth 2 (two) times daily., Disp: 180 tablet, Rfl: 1 .  INGREZZA 80 MG CAPS, Take 80 mg by mouth daily., Disp: 30 capsule, Rfl: 5 .  ipratropium-albuterol (DUONEB) 0.5-2.5 (3) MG/3ML SOLN, Inhale 3 mLs into the lungs every 6 (six) hours as needed (SOB, wheezing). , Disp: , Rfl:  .  ketorolac (TORADOL) 10 MG tablet, TK 1 T PO  Q 6 H PRF P, Disp: , Rfl: 0 .  linaclotide (LINZESS) 290 MCG CAPS capsule, Take 290 mcg by mouth daily before breakfast., Disp: , Rfl:  .  LYRICA 100 MG capsule, , Disp: , Rfl:  .  montelukast (SINGULAIR) 10 MG tablet, Take 10 mg by mouth at bedtime. , Disp: , Rfl:  .  montelukast (SINGULAIR) 10 MG tablet, TAKE 1 TABLET BY MOUTH NIGHTLY, Disp: , Rfl:  .  Multiple Vitamin (MULTI VITAMIN) TABS, Take 1 tablet by mouth daily., Disp: 90 tablet, Rfl: 1 .  OLANZapine (ZYPREXA) 20 MG tablet, TAKE 1 TABLET(20 MG) BY MOUTH AT BEDTIME, Disp: 90 tablet, Rfl: 1 .  omeprazole (PRILOSEC) 40 MG capsule, Take 40 mg by mouth daily. , Disp: , Rfl:  .  ondansetron (ZOFRAN ODT) 4 MG disintegrating tablet, Take 4 mg by mouth every 8 (eight) hours as needed for nausea. , Disp: , Rfl:  .  prazosin (MINIPRESS) 2 MG capsule, Take 1 capsule (2 mg total) by mouth at bedtime., Disp: 90 capsule, Rfl: 0 .  predniSONE (DELTASONE) 10 MG tablet, Take 10 mg by mouth daily with breakfast. , Disp: , Rfl:  .  promethazine (PHENERGAN) 12.5 MG tablet, Take 12.5 mg by mouth every 6 (six) hours as needed for nausea or vomiting. , Disp: , Rfl:  .  traMADol (ULTRAM) 50 MG tablet, Take 50  mg by mouth every 6 (six) hours as needed for moderate pain (kidney stones)., Disp: , Rfl:  .  traZODone (DESYREL) 100 MG tablet, 1 and 1/2 tabs hs, Disp: 135 tablet, Rfl: 1 .  levothyroxine (LEVOTHROID) 88 MCG tablet, Take 88 mcg by mouth daily before breakfast., Disp: , Rfl:  .  levothyroxine (SYNTHROID, LEVOTHROID) 100 MCG tablet, Take 100 mcg by mouth daily before breakfast. , Disp: , Rfl:  .  topiramate (TOPAMAX) 100 MG tablet, Take 1.5 tablets (150 mg total) by mouth 2 (two) times daily. (Patient not taking: Reported on 12/08/2019), Disp: 270 tablet, Rfl: 1 Medication Side Effects: none  Family Medical/ Social History: Changes? Yes husband has been out of work for CTS surgery but is going back  to work Midwife.  MENTAL HEALTH EXAM:  There were no vitals taken for this visit.There is no height or weight on file to calculate BMI.  General Appearance: unable to assess  Eye Contact:  unable to assess  Speech:  Clear and Coherent  Volume:  Normal  Mood:  Euthymic  Affect:  unable to assess  Thought Process:  Goal Directed and Descriptions of Associations: Intact  Orientation:  Full (Time, Place, and Person)  Thought Content: Logical   Suicidal Thoughts:  No  Homicidal Thoughts:  No  Memory:  WNL  Judgement:  Good  Insight:  Good  Psychomotor Activity:  unable to assess  Concentration:  Concentration: Good  Recall:  Good  Fund of Knowledge: Good  Language: Good  Assets:  Desire for Improvement  ADL's:  Intact  Cognition: WNL  Prognosis:  Good    DIAGNOSES:    ICD-10-CM   1. Bipolar I disorder, most recent episode depressed ([place])  F31.30   2. PTSD (post-traumatic stress disorder)  F43.10   3. Generalized anxiety disorder  F41.1   4. Tardive dyskinesia  G24.01   5. Insomnia, unspecified type  G47.00     Receiving Psychotherapy: No    RECOMMENDATIONS:  PDMP was reviewed. Increase  Trazodone to 100mg , 1-2 qhs prn. Give increased qty when she needs RF.  Continue BuSpar 30  mg p.o. twice daily. Continue Valium 5 mg 1 twice daily as needed.  Okay to increase to 3 times daily if needed. Continue Luvox 100 mg, 2 p.o. nightly. Continue gabapentin 400 mg, 2 p.o. every morning 2 at lunch, 4 p.o. nightly.  She cannot swallow the 800 mg pills is the purpose for this dosing. Continue hydroxyzine 25 mg twice daily as needed. Continue Ingrezza 80 mg daily. Continue Zyprexa 20 mg nightly. Continue prazosin 2 mg nightly. She is having to use a different pharmacy.  She will let us know which one that is when she needs refills on her meds.  We will send them all at that time. We will order labs at the next visit if she has not had them drawn by another provider. Return in 3 months.  Donnal Moat, PA-C

## 2019-12-13 DIAGNOSIS — D509 Iron deficiency anemia, unspecified: Secondary | ICD-10-CM

## 2019-12-21 ENCOUNTER — Telehealth: Payer: Self-pay

## 2019-12-21 NOTE — Telephone Encounter (Signed)
Pharmacy called about Ingrezza 80mg  med. Says it needs a PA and the key code on Cover My Meds is #[id number]

## 2019-12-21 NOTE — Telephone Encounter (Signed)
Morgan [name], or Morgan [name], if needed, work her in at lunch one day so I can do this exam. Thanks.

## 2019-12-24 NOTE — Telephone Encounter (Signed)
Left a message for pt to call back to schedule during Morgan [name] lunch time slot

## 2020-01-10 ENCOUNTER — Ambulatory Visit (INDEPENDENT_AMBULATORY_CARE_PROVIDER_SITE_OTHER): Payer: Medicare Other | Admitting: Physician Assistant

## 2020-01-10 ENCOUNTER — Encounter: Payer: Self-pay | Admitting: Physician Assistant

## 2020-01-10 DIAGNOSIS — G2401 Drug induced subacute dyskinesia: Secondary | ICD-10-CM

## 2020-01-10 DIAGNOSIS — G4700 Insomnia, unspecified: Secondary | ICD-10-CM

## 2020-01-10 DIAGNOSIS — F4310 Post-traumatic stress disorder, unspecified: Secondary | ICD-10-CM

## 2020-01-10 DIAGNOSIS — F319 Bipolar disorder, unspecified: Secondary | ICD-10-CM | POA: Diagnosis not present

## 2020-01-10 MED ORDER — GABAPENTIN 400 MG PO CAPS: ORAL_CAPSULE | ORAL | 0 refills | Status: DC

## 2020-01-10 NOTE — Progress Notes (Signed)
[facility]  Patient ID: Morgan [name],  MRN: [id number]  PCP: [facility]  Date of Evaluation: 01/10/2020 Time spent:20 minutes  Chief Complaint:  Chief Complaint    Anxiety; Depression; Insomnia; Follow-up     Virtual Visit via Telephone Note  I connected with patient by a video enabled telemedicine application or telephone, with their informed consent, and verified patient privacy and that I am speaking with the correct person using two identifiers.  I am private, in my office and the patient is home.  I discussed the limitations, risks, security and privacy concerns of performing an evaluation and management service by telephone and the availability of in person appointments. I also discussed with the patient that there may be a patient responsible charge related to this service. The patient expressed understanding and agreed to proceed.   I discussed the assessment and treatment plan with the patient. The patient was provided an opportunity to ask questions and all were answered. The patient agreed with the plan and demonstrated an understanding of the instructions.   The patient was advised to call back or seek an in-person evaluation if the symptoms worsen or if the condition fails to improve as anticipated.  I provided 20 minutes of non-face-to-face time during this encounter.  HISTORY/CURRENT STATUS: HPI for routine med check.  At the last visit, we increased the trazodone.  States she is now sleeping better.  She is able to fall asleep quicker and usually stays asleep.  She is more rested when she gets up in the morning.  She denies nightmares.  She will occasionally have a vivid dream but they are not every night and certainly not scary like they used to be.  The anxiety is much better controlled.  She occasionally has a panic attack but feels that the BuSpar and Valium do help.  The gabapentin has also decreased the generalized  anxiety as well.  The hydroxyzine helps as an emergency medicine as well.  She is able to enjoy things.  Energy and motivation are good.  She does not cry easily.  Appetite is normal.  No reported weight gain or loss.  She denies suicidal or homicidal thoughts.  Patient denies increased energy with decreased need for sleep, no increased talkativeness, no racing thoughts, no impulsivity or risky behaviors, no increased spending, no increased libido, no grandiosity.  Denies dizziness, syncope, seizures, numbness, tingling, tremor, tics, unsteady gait, slurred speech, confusion. Denies muscle or joint pain, stiffness, or dystonia.  States the Julio Alm continues to help with the abnormal mouth movements.  Individual Medical History/ Review of Systems: Changes? :No  Has had 'iron infusions'  Past medications for mental health diagnoses include: Zoloft, trazodone, gabapentin, Risperdal, hydroxyzine, prazosin, nortriptyline, Adderall, Rexulti, Topamax, Latuda,Ingrezzalithium, Valium, clozapine, BuSpar, Zyprexa,  Allergies: Clindamycin/lincomycin, Nsaids, Oxycodone, and Percocet [oxycodone-acetaminophen]  Current Medications:  Current Outpatient Medications:  .  AIMOVIG 70 MG/ML SOAJ, Inject 70 mg into the muscle every 30 (thirty) days. , Disp: , Rfl: 11 .  AMITIZA 24 MCG capsule, Take 24 mcg by mouth 2 (two) times daily with a meal. , Disp: , Rfl:  .  busPIRone (BUSPAR) 30 MG tablet, Take 1 tablet (30 mg total) by mouth 2 (two) times daily., Disp: 180 tablet, Rfl: 1 .  cyclobenzaprine (FLEXERIL) 10 MG tablet, Take 1 tablet (10 mg total) by mouth 3 (three) times daily as needed for muscle spasms., Disp: 270 tablet, Rfl: 0 .  diazepam (VALIUM) 5 MG tablet, Take  1 tablet (5 mg total) by mouth 2 (two) times daily., Disp: 60 tablet, Rfl: 2 .  diclofenac sodium (VOLTAREN) 1 % GEL, Apply 4 g topically 4 (four) times daily., Disp: 1 Tube, Rfl: 0 .  fluticasone (FLONASE) 50 MCG/ACT nasal spray, Place 1  spray into both nostrils daily as needed for allergies or rhinitis., Disp: , Rfl:  .  fluvoxaMINE (LUVOX) 100 MG tablet, Take 2 tablets (200 mg total) by mouth at bedtime., Disp: 180 tablet, Rfl: 1 .  hydrOXYzine (ATARAX/VISTARIL) 25 MG tablet, Take 1 tablet (25 mg total) by mouth 2 (two) times daily., Disp: 180 tablet, Rfl: 1 .  INGREZZA 80 MG CAPS, Take 80 mg by mouth daily., Disp: 30 capsule, Rfl: 5 .  ipratropium-albuterol (DUONEB) 0.5-2.5 (3) MG/3ML SOLN, Inhale 3 mLs into the lungs every 6 (six) hours as needed (SOB, wheezing). , Disp: , Rfl:  .  ketorolac (TORADOL) 10 MG tablet, TK 1 T PO  Q 6 H PRF P, Disp: , Rfl: 0 .  levothyroxine (LEVOTHROID) 88 MCG tablet, Take 112 mcg by mouth daily before breakfast. , Disp: , Rfl:  .  linaclotide (LINZESS) 290 MCG CAPS capsule, Take 290 mcg by mouth daily before breakfast., Disp: , Rfl:  .  LYRICA 100 MG capsule, , Disp: , Rfl:  .  montelukast (SINGULAIR) 10 MG tablet, Take 10 mg by mouth at bedtime. , Disp: , Rfl:  .  montelukast (SINGULAIR) 10 MG tablet, TAKE 1 TABLET BY MOUTH NIGHTLY, Disp: , Rfl:  .  Multiple Vitamin (MULTI VITAMIN) TABS, Take 1 tablet by mouth daily., Disp: 90 tablet, Rfl: 1 .  OLANZapine (ZYPREXA) 20 MG tablet, TAKE 1 TABLET(20 MG) BY MOUTH AT BEDTIME, Disp: 90 tablet, Rfl: 1 .  omeprazole (PRILOSEC) 40 MG capsule, Take 40 mg by mouth daily. , Disp: , Rfl:  .  ondansetron (ZOFRAN ODT) 4 MG disintegrating tablet, Take 4 mg by mouth every 8 (eight) hours as needed for nausea. , Disp: , Rfl:  .  prazosin (MINIPRESS) 2 MG capsule, Take 1 capsule (2 mg total) by mouth at bedtime., Disp: 90 capsule, Rfl: 0 .  promethazine (PHENERGAN) 12.5 MG tablet, Take 12.5 mg by mouth every 6 (six) hours as needed for nausea or vomiting. , Disp: , Rfl:  .  traMADol (ULTRAM) 50 MG tablet, Take 50 mg by mouth every 6 (six) hours as needed for moderate pain (kidney stones)., Disp: , Rfl:  .  traZODone (DESYREL) 100 MG tablet, 1 and 1/2 tabs hs, Disp:  135 tablet, Rfl: 1 .  gabapentin (NEURONTIN) 400 MG capsule, 2 q am, 2 at lunch, and 4 at hs., Disp: 240 capsule, Rfl: 0 .  levothyroxine (SYNTHROID, LEVOTHROID) 100 MCG tablet, Take 100 mcg by mouth daily before breakfast. , Disp: , Rfl:  .  predniSONE (DELTASONE) 10 MG tablet, Take 10 mg by mouth daily with breakfast. , Disp: , Rfl:  .  topiramate (TOPAMAX) 100 MG tablet, Take 1.5 tablets (150 mg total) by mouth 2 (two) times daily. (Patient not taking: Reported on 12/08/2019), Disp: 270 tablet, Rfl: 1 Medication Side Effects: none  Family Medical/ Social History: Changes? No  MENTAL HEALTH EXAM:  There were no vitals taken for this visit.There is no height or weight on file to calculate BMI.  General Appearance: Unable to assess  Eye Contact:  Unable to assess  Speech:  Clear and Coherent  Volume:  Normal  Mood:  Euthymic  Affect:  Unable to assess  Thought  Process:  Goal Directed and Descriptions of Associations: Intact  Orientation:  Full (Time, Place, and Person)  Thought Content: Logical   Suicidal Thoughts:  No  Homicidal Thoughts:  No  Memory:  WNL  Judgement:  Good  Insight:  Good  Psychomotor Activity:  Unable to assess  Concentration:  Concentration: Good  Recall:  Good  Fund of Knowledge: Good  Language: Good  Assets:  Desire for Improvement  ADL's:  Intact  Cognition: WNL  Prognosis:  Good    DIAGNOSES:    ICD-10-CM   1. Bipolar I disorder ([place])  F31.9   2. Tardive dyskinesia  G24.01   3. PTSD (post-traumatic stress disorder)  F43.10   4. Insomnia, unspecified type  G47.00     Receiving Psychotherapy: No    RECOMMENDATIONS:  PDMP was reviewed. I spent 20 minutes with her. Continue BuSpar 30 mg, 1 p.o. twice daily. Continue Valium 5 mg, 1 p.o. twice daily as needed. Continue Luvox 100 mg, 2 p.o. nightly. Continue gabapentin 400 mg, 2 p.o. every morning, 2 p.o. at lunch, 4 p.o. nightly.  (She is unable to swallow the 800 mg pills) Continue hydroxyzine  25 mg 1 twice daily as needed. Continue Ingrezza 80 mg daily. Continue Zyprexa 20 mg nightly. Continue prazosin 2 mg nightly. Continue trazodone 100 mg, 1-2 nightly as needed sleep. Return in approximately 3 months.  Donnal Moat, PA-C

## 2020-01-13 NOTE — Telephone Encounter (Signed)
Morgan [name], Please make sure this pt comes for OV 3/12. I saw her a few days ago, but didn't remember she needs the face to face exam in order to get the [place].  Thanks.

## 2020-01-19 NOTE — Telephone Encounter (Signed)
Ok

## 2020-01-28 ENCOUNTER — Ambulatory Visit: Payer: Medicare Other | Admitting: Physician Assistant

## 2020-02-03 ENCOUNTER — Other Ambulatory Visit: Payer: Self-pay

## 2020-02-03 ENCOUNTER — Encounter: Payer: Self-pay | Admitting: Physician Assistant

## 2020-02-03 ENCOUNTER — Ambulatory Visit (INDEPENDENT_AMBULATORY_CARE_PROVIDER_SITE_OTHER): Payer: Medicare Other | Admitting: Physician Assistant

## 2020-02-03 DIAGNOSIS — F4310 Post-traumatic stress disorder, unspecified: Secondary | ICD-10-CM

## 2020-02-03 DIAGNOSIS — F411 Generalized anxiety disorder: Secondary | ICD-10-CM

## 2020-02-03 DIAGNOSIS — G4700 Insomnia, unspecified: Secondary | ICD-10-CM

## 2020-02-03 DIAGNOSIS — F319 Bipolar disorder, unspecified: Secondary | ICD-10-CM

## 2020-02-03 DIAGNOSIS — G2401 Drug induced subacute dyskinesia: Secondary | ICD-10-CM | POA: Diagnosis not present

## 2020-02-03 MED ORDER — BUPROPION HCL ER (XL) 150 MG PO TB24: 150.0000 mg | ORAL_TABLET | Freq: Every day | ORAL | 1 refills | Status: DC

## 2020-02-03 NOTE — Progress Notes (Signed)
[facility]  Patient ID: Morgan [name],  MRN: [id number]  PCP: [place]  Date of Evaluation: 02/03/2020 Time spent:30 minutes  Chief Complaint:  Chief Complaint    Anxiety; Depression; Other      HISTORY/CURRENT STATUS: HPI For AIMS testing   She stopped the Ingrezza about a month ago. She has noticed no abnl mouth movements.  States she will not be able to afford the Ingrezza even with the Fatima Sanger she has been given.  She has a very high deductible insurance and there is something about that requiring her to meet that deductible before she can use the grant."  I have not had any abnormal movements, and I have asked my husband what he thinks.  He has not noticed any either."  States she has been more depressed, not able to enjoy things, lacks energy and motivation, does not want to go anywhere or do anything a lot of the time.  Denies suicidal or homicidal thoughts.  She has a new puppy and asked about having a letter or what ever is needed for an emotional support animal.  She will find out more of what she needs and will write the letter.  Patient denies increased energy with decreased need for sleep, no increased talkativeness, no racing thoughts, no impulsivity or risky behaviors, no increased spending, no increased libido, no grandiosity. No hallucinations.  No increased irritability.  She does get anxious at times but her meds do seem to help.  It is more of a generalized sense of anxiety not so much panic attacks.  Denies dizziness, syncope, seizures, numbness, tingling, tremor, tics, unsteady gait, slurred speech, confusion. Denies muscle or joint pain, stiffness, or dystonia.  Individual Medical History/ Review of Systems: Changes? :Yes  has had several root canals and her wisdom teeth removed.   Past medications for mental health diagnoses include: Zoloft, trazodone, gabapentin, Risperdal, hydroxyzine, prazosin, nortriptyline,  Adderall, Rexulti, Topamax, Latuda,Ingrezzalithium, Valium, clozapine, BuSpar, Zyprexa  Allergies: Clindamycin/lincomycin, Nsaids, Oxycodone, and Percocet [oxycodone-acetaminophen]  Current Medications:  Current Outpatient Medications:  .  AIMOVIG 70 MG/ML SOAJ, Inject 70 mg into the muscle every 30 (thirty) days. , Disp: , Rfl: 11 .  AMITIZA 24 MCG capsule, Take 24 mcg by mouth 2 (two) times daily with a meal. , Disp: , Rfl:  .  busPIRone (BUSPAR) 30 MG tablet, Take 1 tablet (30 mg total) by mouth 2 (two) times daily., Disp: 180 tablet, Rfl: 1 .  cyclobenzaprine (FLEXERIL) 10 MG tablet, Take 1 tablet (10 mg total) by mouth 3 (three) times daily as needed for muscle spasms., Disp: 270 tablet, Rfl: 0 .  diazepam (VALIUM) 5 MG tablet, Take 1 tablet (5 mg total) by mouth 2 (two) times daily., Disp: 60 tablet, Rfl: 2 .  diclofenac sodium (VOLTAREN) 1 % GEL, Apply 4 g topically 4 (four) times daily., Disp: 1 Tube, Rfl: 0 .  fluticasone (FLONASE) 50 MCG/ACT nasal spray, Place 1 spray into both nostrils daily as needed for allergies or rhinitis., Disp: , Rfl:  .  fluvoxaMINE (LUVOX) 100 MG tablet, Take 2 tablets (200 mg total) by mouth at bedtime., Disp: 180 tablet, Rfl: 1 .  gabapentin (NEURONTIN) 400 MG capsule, 2 q am, 2 at lunch, and 4 at hs., Disp: 240 capsule, Rfl: 0 .  hydrOXYzine (ATARAX/VISTARIL) 25 MG tablet, Take 1 tablet (25 mg total) by mouth 2 (two) times daily., Disp: 180 tablet, Rfl: 1 .  ipratropium-albuterol (DUONEB) 0.5-2.5 (3) MG/3ML SOLN, Inhale  3 mLs into the lungs every 6 (six) hours as needed (SOB, wheezing). , Disp: , Rfl:  .  ketorolac (TORADOL) 10 MG tablet, TK 1 T PO  Q 6 H PRF P, Disp: , Rfl: 0 .  levothyroxine (SYNTHROID, LEVOTHROID) 100 MCG tablet, Take 112 mcg by mouth daily before breakfast. , Disp: , Rfl:  .  linaclotide (LINZESS) 290 MCG CAPS capsule, Take 290 mcg by mouth daily before breakfast., Disp: , Rfl:  .  LYRICA 100 MG capsule, , Disp: , Rfl:  .   montelukast (SINGULAIR) 10 MG tablet, Take 10 mg by mouth at bedtime. , Disp: , Rfl:  .  montelukast (SINGULAIR) 10 MG tablet, TAKE 1 TABLET BY MOUTH NIGHTLY, Disp: , Rfl:  .  Multiple Vitamin (MULTI VITAMIN) TABS, Take 1 tablet by mouth daily., Disp: 90 tablet, Rfl: 1 .  OLANZapine (ZYPREXA) 20 MG tablet, TAKE 1 TABLET(20 MG) BY MOUTH AT BEDTIME, Disp: 90 tablet, Rfl: 1 .  omeprazole (PRILOSEC) 40 MG capsule, Take 40 mg by mouth daily. , Disp: , Rfl:  .  OnabotulinumtoxinA (BOTOX IJ), Inject as directed every 3 (three) months., Disp: , Rfl:  .  ondansetron (ZOFRAN ODT) 4 MG disintegrating tablet, Take 4 mg by mouth every 8 (eight) hours as needed for nausea. , Disp: , Rfl:  .  prazosin (MINIPRESS) 2 MG capsule, Take 1 capsule (2 mg total) by mouth at bedtime., Disp: 90 capsule, Rfl: 0 .  predniSONE (DELTASONE) 10 MG tablet, Take 10 mg by mouth daily with breakfast. , Disp: , Rfl:  .  promethazine (PHENERGAN) 12.5 MG tablet, Take 12.5 mg by mouth every 6 (six) hours as needed for nausea or vomiting. , Disp: , Rfl:  .  traMADol (ULTRAM) 50 MG tablet, Take 50 mg by mouth every 6 (six) hours as needed for moderate pain (kidney stones)., Disp: , Rfl:  .  traZODone (DESYREL) 100 MG tablet, 1 and 1/2 tabs hs, Disp: 135 tablet, Rfl: 1 .  buPROPion (WELLBUTRIN XL) 150 MG 24 hr tablet, Take 1 tablet (150 mg total) by mouth daily., Disp: 30 tablet, Rfl: 1 .  levothyroxine (LEVOTHROID) 88 MCG tablet, Take 112 mcg by mouth daily before breakfast. , Disp: , Rfl:  .  topiramate (TOPAMAX) 100 MG tablet, Take 1.5 tablets (150 mg total) by mouth 2 (two) times daily. (Patient not taking: Reported on 12/08/2019), Disp: 270 tablet, Rfl: 1 Medication Side Effects: none  Family Medical/ Social History: Changes? No  MENTAL HEALTH EXAM:  There were no vitals taken for this visit.There is no height or weight on file to calculate BMI.  General Appearance: Casual, Neat and Well Groomed  Eye Contact:  Good  Speech:   Clear and Coherent and Normal Rate  Volume:  Normal  Mood:  Euthymic  Affect:  Appropriate  Thought Process:  Goal Directed and Descriptions of Associations: Intact  Orientation:  Full (Time, Place, and Person)  Thought Content: Logical   Suicidal Thoughts:  No  Homicidal Thoughts:  No  Memory:  WNL  Judgement:  Good  Insight:  Good  Psychomotor Activity:  Normal and no tremor, lip smacking, chewing, grinding teeth, or other signs of TD  Concentration:  Concentration: Good  Recall:  Good  Fund of Knowledge: Good  Language: Good  Assets:  Desire for Improvement  ADL's:  Intact  Cognition: WNL  Prognosis:  Good    DIAGNOSES:    ICD-10-CM   1. Bipolar I disorder ([place])  F31.9  2. Tardive dyskinesia  G24.01   3. PTSD (post-traumatic stress disorder)  F43.10   4. Insomnia, unspecified type  G47.00   5. Generalized anxiety disorder  F41.1     Receiving Psychotherapy: No    RECOMMENDATIONS:  PDMP was reviewed. I spent 30 minutes with her. Since she is no longer having symptoms of tardive dyskinesia, we agreed to stay off of the [place].  If those symptoms recur, we can try either Artane or Cogentin and it is okay to call in.  She verbalizes understanding. We discussed different options for the worsening of depression.  I recommend adding Wellbutrin, knowing it might increase her anxiety at first but hopefully not more than a week or so until her body gets used to it.  She would like to try it and will let me know if the anxiety occurs, it is intolerable or lasting long.  We discussed the benefits, risks, side effects and she accepts. Start Wellbutrin XL 150 mg, 1 p.o. daily. Continue BuSpar 30 mg, 1 p.o. twice daily. Continue Valium 5 mg, 1 p.o. twice daily as needed. Continue Luvox 100 mg, 2 p.o. nightly. Continue gabapentin 400 mg, 2 p.o. every morning, 2 at lunch, and 4 at at bedtime. Continue hydroxyzine 25 mg, 1 p.o. twice daily as needed. Continue Zyprexa 20 mg 1 p.o.  nightly. Continue prazosin 2 mg nightly. Discontinue Ingrezza, she has already done that. Return in 4 weeks.  Donnal Moat, PA-C

## 2020-02-07 ENCOUNTER — Telehealth: Payer: Self-pay

## 2020-02-07 ENCOUNTER — Other Ambulatory Visit: Payer: Self-pay

## 2020-02-07 MED ORDER — PRAZOSIN HCL 2 MG PO CAPS: 2.0000 mg | ORAL_CAPSULE | Freq: Every day | ORAL | 1 refills | Status: DC

## 2020-02-07 MED ORDER — OLANZAPINE 20 MG PO TABS: ORAL_TABLET | ORAL | 1 refills | Status: DC

## 2020-02-07 MED ORDER — GABAPENTIN 400 MG PO CAPS: ORAL_CAPSULE | ORAL | 1 refills | Status: DC

## 2020-02-07 MED ORDER — TRAZODONE HCL 100 MG PO TABS: ORAL_TABLET | ORAL | 1 refills | Status: DC

## 2020-02-07 NOTE — Telephone Encounter (Signed)
Confirmed with patient her new pharmacy is [facility], she's needing trazodone, gabapentin, prazosin, and olanzapine for 90 day supply.

## 2020-02-07 NOTE — Telephone Encounter (Signed)
Left patient a message to call back to confirm she needed refills for Trazodone, Prazosin, and Olanzapine submitted to [facility].

## 2020-02-09 ENCOUNTER — Other Ambulatory Visit: Payer: Self-pay

## 2020-02-09 ENCOUNTER — Telehealth: Payer: Self-pay | Admitting: Physician Assistant

## 2020-02-09 MED ORDER — GABAPENTIN 400 MG PO CAPS: ORAL_CAPSULE | ORAL | 0 refills | Status: DC

## 2020-02-09 NOTE — Telephone Encounter (Signed)
Morgan [name] called and said that [facility] contacted her and told her the gabapentin was sent in as DAW for brand only.  She needs it to be the generic  Gabapentin is the generic so I don't understand this, but she said they are going to charge her for the brand.  Please contact to let them know this is to be generic.  Also she needs her Flexeril refilled to [facility]

## 2020-02-09 NOTE — Telephone Encounter (Signed)
Contacted [facility] and they didn't see any issues with her Gabapentin, she is new to [facility] they said. 90 day supply preferred with mail order. They had a 30 day on file and I said we would update with 90 day. They didn't not see anything about brand on her Rx.

## 2020-02-12 ENCOUNTER — Other Ambulatory Visit: Payer: Self-pay | Admitting: Physician Assistant

## 2020-03-02 ENCOUNTER — Encounter: Payer: Self-pay | Admitting: Physician Assistant

## 2020-03-02 ENCOUNTER — Ambulatory Visit (INDEPENDENT_AMBULATORY_CARE_PROVIDER_SITE_OTHER): Payer: Medicare Other | Admitting: Physician Assistant

## 2020-03-02 DIAGNOSIS — F4310 Post-traumatic stress disorder, unspecified: Secondary | ICD-10-CM

## 2020-03-02 DIAGNOSIS — F411 Generalized anxiety disorder: Secondary | ICD-10-CM | POA: Diagnosis not present

## 2020-03-02 DIAGNOSIS — F319 Bipolar disorder, unspecified: Secondary | ICD-10-CM | POA: Diagnosis not present

## 2020-03-02 DIAGNOSIS — G4700 Insomnia, unspecified: Secondary | ICD-10-CM | POA: Diagnosis not present

## 2020-03-02 NOTE — Progress Notes (Signed)
[facility]  Patient ID: Morgan [name],  MRN: [id number]  PCP: [place]  Date of Evaluation: 03/02/2020 Time spent:20 minutes  Chief Complaint:  Chief Complaint    Anxiety; Depression; Insomnia     Virtual Visit via Telephone Note  I connected with patient by a video enabled telemedicine application or telephone, with their informed consent, and verified patient privacy and that I am speaking with the correct person using two identifiers.  I am private, in my office and the patient is home.   I discussed the limitations, risks, security and privacy concerns of performing an evaluation and management service by telephone and the availability of in person appointments. I also discussed with the patient that there may be a patient responsible charge related to this service. The patient expressed understanding and agreed to proceed.   I discussed the assessment and treatment plan with the patient. The patient was provided an opportunity to ask questions and all were answered. The patient agreed with the plan and demonstrated an understanding of the instructions.   The patient was advised to call back or seek an in-person evaluation if the symptoms worsen or if the condition fails to improve as anticipated.  I provided 20 minutes of non-face-to-face time during this encounter.  HISTORY/CURRENT STATUS: HPI For routine med check.  Doing better since we added Wellbutrin last month. About 25%, and happy about that!  She is more able to enjoy things.  Energy and motivation are better but still not where she would like for them to be.  She is not isolating.  Hygiene is normal.  Not crying easily.  Denies suicidal or homicidal thoughts.  She denies increased energy with decreased need for sleep, no increased chattiness, no impulsivity or risky behavior, no increased libido or spending, no hallucinations or grandiosity.  No paranoia.  As reported at the  last visit, she has not had any more mouth or tongue movements that have been abnormal.  She is no longer on Ingrezza and does not need it now.  She still gets anxious at times but it is mostly generalized and not so much panic attacks.  She feels that the preventative medication such as BuSpar and Luvox are helpful.  She takes the hydroxyzine and Valium as rescue medications and they help as well.  Denies dizziness, syncope, seizures, numbness, tingling, tremor, tics, unsteady gait, slurred speech, confusion. Denies muscle or joint pain, stiffness, or dystonia.  Individual Medical History/ Review of Systems: Changes? :No    Past medications for mental health diagnoses include: Zoloft, trazodone, gabapentin, Risperdal, hydroxyzine, prazosin, nortriptyline, Adderall, Rexulti, Topamax, Latuda,Ingrezzalithium, Valium, clozapine, BuSpar, Zyprexa  Allergies: Clindamycin/lincomycin, Nsaids, Oxycodone, and Percocet [oxycodone-acetaminophen]  Current Medications:  Current Outpatient Medications:  .  AIMOVIG 70 MG/ML SOAJ, Inject 70 mg into the muscle every 30 (thirty) days. , Disp: , Rfl: 11 .  AMITIZA 24 MCG capsule, Take 24 mcg by mouth 2 (two) times daily with a meal. , Disp: , Rfl:  .  buPROPion (WELLBUTRIN XL) 150 MG 24 hr tablet, Take 1 tablet (150 mg total) by mouth daily., Disp: 30 tablet, Rfl: 1 .  busPIRone (BUSPAR) 30 MG tablet, Take 1 tablet (30 mg total) by mouth 2 (two) times daily., Disp: 180 tablet, Rfl: 1 .  cyclobenzaprine (FLEXERIL) 10 MG tablet, Take 1 tablet (10 mg total) by mouth 3 (three) times daily as needed for muscle spasms., Disp: 270 tablet, Rfl: 0 .  diazepam (VALIUM) 5 MG  tablet, Take 1 tablet (5 mg total) by mouth 2 (two) times daily., Disp: 60 tablet, Rfl: 2 .  diclofenac sodium (VOLTAREN) 1 % GEL, Apply 4 g topically 4 (four) times daily., Disp: 1 Tube, Rfl: 0 .  fluticasone (FLONASE) 50 MCG/ACT nasal spray, Place 1 spray into both nostrils daily as needed for  allergies or rhinitis., Disp: , Rfl:  .  fluvoxaMINE (LUVOX) 100 MG tablet, Take 2 tablets (200 mg total) by mouth at bedtime., Disp: 180 tablet, Rfl: 1 .  gabapentin (NEURONTIN) 400 MG capsule, TAKE 2 CAPSULES BY MOUTH IN THE MORNING , 2 CAPSULES AT LUNCH, AND 4 CAPSULES EVERY NIGHT AT BEDTIME, Disp: 720 capsule, Rfl: 3 .  hydrOXYzine (ATARAX/VISTARIL) 25 MG tablet, Take 1 tablet (25 mg total) by mouth 2 (two) times daily., Disp: 180 tablet, Rfl: 1 .  ipratropium-albuterol (DUONEB) 0.5-2.5 (3) MG/3ML SOLN, Inhale 3 mLs into the lungs every 6 (six) hours as needed (SOB, wheezing). , Disp: , Rfl:  .  ketorolac (TORADOL) 10 MG tablet, TK 1 T PO  Q 6 H PRF P, Disp: , Rfl: 0 .  levothyroxine (LEVOTHROID) 88 MCG tablet, Take 112 mcg by mouth daily before breakfast. , Disp: , Rfl:  .  linaclotide (LINZESS) 290 MCG CAPS capsule, Take 290 mcg by mouth daily before breakfast., Disp: , Rfl:  .  LYRICA 100 MG capsule, , Disp: , Rfl:  .  montelukast (SINGULAIR) 10 MG tablet, Take 10 mg by mouth at bedtime. , Disp: , Rfl:  .  montelukast (SINGULAIR) 10 MG tablet, TAKE 1 TABLET BY MOUTH NIGHTLY, Disp: , Rfl:  .  Multiple Vitamin (MULTI VITAMIN) TABS, Take 1 tablet by mouth daily., Disp: 90 tablet, Rfl: 1 .  OLANZapine (ZYPREXA) 20 MG tablet, TAKE 1 TABLET(20 MG) BY MOUTH AT BEDTIME, Disp: 90 tablet, Rfl: 1 .  omeprazole (PRILOSEC) 40 MG capsule, Take 40 mg by mouth daily. , Disp: , Rfl:  .  OnabotulinumtoxinA (BOTOX IJ), Inject as directed every 3 (three) months., Disp: , Rfl:  .  ondansetron (ZOFRAN ODT) 4 MG disintegrating tablet, Take 4 mg by mouth every 8 (eight) hours as needed for nausea. , Disp: , Rfl:  .  prazosin (MINIPRESS) 2 MG capsule, Take 1 capsule (2 mg total) by mouth at bedtime., Disp: 90 capsule, Rfl: 1 .  predniSONE (DELTASONE) 10 MG tablet, Take 10 mg by mouth daily with breakfast. , Disp: , Rfl:  .  promethazine (PHENERGAN) 12.5 MG tablet, Take 12.5 mg by mouth every 6 (six) hours as needed  for nausea or vomiting. , Disp: , Rfl:  .  traMADol (ULTRAM) 50 MG tablet, Take 50 mg by mouth every 6 (six) hours as needed for moderate pain (kidney stones)., Disp: , Rfl:  .  traZODone (DESYREL) 100 MG tablet, 1 and 1/2 tabs hs, Disp: 135 tablet, Rfl: 1 .  zonisamide (ZONEGRAN) 50 MG capsule, Take by mouth., Disp: , Rfl:  .  levothyroxine (SYNTHROID, LEVOTHROID) 100 MCG tablet, Take 112 mcg by mouth daily before breakfast. , Disp: , Rfl:  .  topiramate (TOPAMAX) 100 MG tablet, Take 1.5 tablets (150 mg total) by mouth 2 (two) times daily. (Patient not taking: Reported on 12/08/2019), Disp: 270 tablet, Rfl: 1 Medication Side Effects: none  Family Medical/ Social History: Changes? No  MENTAL HEALTH EXAM:  There were no vitals taken for this visit.There is no height or weight on file to calculate BMI.  General Appearance: unable to assess  Eye Contact:  unable to assess  Speech:  Clear and Coherent and Normal Rate  Volume:  Normal  Mood:  Euthymic  Affect:  unable to assess  Thought Process:  Goal Directed and Descriptions of Associations: Intact  Orientation:  Full (Time, Place, and Person)  Thought Content: Logical   Suicidal Thoughts:  No  Homicidal Thoughts:  No  Memory:  WNL  Judgement:  Good  Insight:  Good  Psychomotor Activity:  unable to assess  Concentration:  Concentration: Good  Recall:  Good  Fund of Knowledge: Good  Language: Good  Assets:  Desire for Improvement  ADL's:  Intact  Cognition: WNL  Prognosis:  Good    DIAGNOSES:    ICD-10-CM   1. Bipolar I disorder ([place])  F31.9   2. Generalized anxiety disorder  F41.1   3. Insomnia, unspecified type  G47.00   4. PTSD (post-traumatic stress disorder)  F43.10     Receiving Psychotherapy: No    RECOMMENDATIONS:  PDMP was reviewed. I spent 20 minutes with her. I am glad she is doing better.  We discussed increasing the Wellbutrin even more but we decided to leave it the same for now.  Over the next 2 to 3  weeks, she may get even more improvement with her mood, energy, and motivation.  If she does not get to at least 50% improvement from the time that we started the Wellbutrin, she can call and we will increase it over the phone.  She verbalizes understanding. Continue Wellbutrin XL 150 mg p.o. daily. Continue BuSpar 30 mg, 1 p.o. twice daily. Continue Valium 5 mg, 1 p.o. twice daily as needed. Continue Luvox 100 mg, 2 p.o. nightly. Continue gabapentin 400 mg, 2 p.o. every morning, 2 p.o. at lunch, and 4 p.o. nightly. Continue hydroxyzine 25 mg, 1 p.o. twice daily as needed. Continue Zyprexa 20 mg, 1 p.o. nightly. Continue prazosin 2 mg, 1 p.o. nightly. Continue trazodone 100 mg, 1.5 pills nightly. Return in 6-8 weeks.  Donnal Moat, PA-C

## 2020-04-10 ENCOUNTER — Telehealth: Payer: Medicare Other | Admitting: Physician Assistant

## 2020-04-10 NOTE — Progress Notes (Signed)
I called the patient for her schedule telehealth visit.  States she just found out earlier today about the death in her family and she has not had time to call and reschedule.  Says she will call back to reschedule.

## 2020-05-03 DIAGNOSIS — D649 Anemia, unspecified: Secondary | ICD-10-CM

## 2020-05-03 DIAGNOSIS — D509 Iron deficiency anemia, unspecified: Secondary | ICD-10-CM

## 2020-05-30 ENCOUNTER — Other Ambulatory Visit: Payer: Self-pay

## 2020-05-30 ENCOUNTER — Emergency Department (HOSPITAL_BASED_OUTPATIENT_CLINIC_OR_DEPARTMENT_OTHER)
Admission: EM | Admit: 2020-05-30 | Discharge: 2020-05-30 | Disposition: A | Discharge status: Home / Self Care | Payer: Medicare Other | Attending: Emergency Medicine | Admitting: Emergency Medicine

## 2020-05-30 ENCOUNTER — Encounter (HOSPITAL_BASED_OUTPATIENT_CLINIC_OR_DEPARTMENT_OTHER): Payer: Self-pay

## 2020-05-30 DIAGNOSIS — Y939 Activity, unspecified: Secondary | ICD-10-CM | POA: Diagnosis not present

## 2020-05-30 DIAGNOSIS — Y929 Unspecified place or not applicable: Secondary | ICD-10-CM | POA: Insufficient documentation

## 2020-05-30 DIAGNOSIS — E039 Hypothyroidism, unspecified: Secondary | ICD-10-CM | POA: Diagnosis not present

## 2020-05-30 DIAGNOSIS — S39012A Strain of muscle, fascia and tendon of lower back, initial encounter: Secondary | ICD-10-CM

## 2020-05-30 DIAGNOSIS — S3992XA Unspecified injury of lower back, initial encounter: Secondary | ICD-10-CM | POA: Diagnosis present

## 2020-05-30 DIAGNOSIS — Z79899 Other long term (current) drug therapy: Secondary | ICD-10-CM | POA: Diagnosis not present

## 2020-05-30 DIAGNOSIS — R202 Paresthesia of skin: Secondary | ICD-10-CM | POA: Insufficient documentation

## 2020-05-30 DIAGNOSIS — X58XXXA Exposure to other specified factors, initial encounter: Secondary | ICD-10-CM | POA: Insufficient documentation

## 2020-05-30 DIAGNOSIS — Y999 Unspecified external cause status: Secondary | ICD-10-CM | POA: Insufficient documentation

## 2020-05-30 DIAGNOSIS — M62830 Muscle spasm of back: Secondary | ICD-10-CM

## 2020-05-30 MED ORDER — DICLOFENAC SODIUM 1 % EX GEL: 4.0000 g | Freq: Four times a day (QID) | CUTANEOUS | 1 refills | Status: AC

## 2020-05-30 NOTE — Discharge Instructions (Signed)
You may use over-the-counter topical muscle creams such as SalonPas, [employer], Bengay, etc. Please stretch, apply heat, and have massage therapy for additional assistance.

## 2020-05-30 NOTE — ED Provider Notes (Signed)
Morgan [name]  CSN: [id number] Arrival date & time: 05/30/20 2103  Chief Complaint(s) Back Pain  HPI Morgan [name] is a 49 y.o. female   The history is provided by the patient.  Back Pain Location:  Sacro-iliac joint and lumbar spine Quality:  Aching, stabbing and shooting Radiates to:  L thigh Pain severity:  Severe Onset quality:  Gradual Duration:  1 month Timing:  Constant Progression:  Waxing and waning Chronicity:  New Relieved by:  Being still Worsened by:  Ambulation, bending, movement, touching and twisting Ineffective treatments:  Narcotics Associated symptoms: paresthesias   Associated symptoms: no bladder incontinence, no bowel incontinence, no dysuria, no fever, no headaches, no perianal numbness and no weakness    Patient seen by PCP and referred to Sport medicine.  Past Medical History Past Medical History:  Diagnosis Date  . Acid reflux   . Family history of adverse reaction to anesthesia    sister has PONV  . H. pylori infection    hx of , none now  . Headache    migraines, uses imitrex as needed  . Hematuria   . History of kidney stones   . Hypothyroidism   . PONV (postoperative nausea and vomiting)   . Renal calculi BILATERAL  . Right ureteral stone   . RLS (restless legs syndrome)   . TBI (traumatic brain injury) ([place]) 04/12/2015   Patient Active Problem List   Diagnosis Date Noted  . Chronic pain 07/27/2019  . Head trauma 01/29/2018  . Major depressive disorder, recurrent, severe without psychotic behavior ([place]) 03/13/2016  . PTSD (post-traumatic stress disorder) 03/13/2016  . Chronic post-traumatic stress disorder (PTSD) 03/13/2016  . Depression   . Suicidal ideation   . Ureteral calculus 12/26/2014  . Ureteral calculus, right 03/31/2013   Home Medication(s) Prior to Admission medications   Medication Sig Start Date End Date Taking? Authorizing Provider  AIMOVIG 70 MG/ML SOAJ Inject  70 mg into the muscle every 30 (thirty) days.  10/13/17   [provider]  AMITIZA 24 MCG capsule Take 24 mcg by mouth 2 (two) times daily with a meal.  09/24/17   [provider]  buPROPion (WELLBUTRIN XL) 150 MG 24 hr tablet Take 1 tablet (150 mg total) by mouth daily. 02/03/20   Donnal Moat T, PA-C  busPIRone (BUSPAR) 30 MG tablet Take 1 tablet (30 mg total) by mouth 2 (two) times daily. 07/27/19   Mozingo, Berdie Ogren, NP  cyclobenzaprine (FLEXERIL) 10 MG tablet Take 1 tablet (10 mg total) by mouth 3 (three) times daily as needed for muscle spasms. 11/04/18   Shugart, Lissa Hoard, PA-C  diazepam (VALIUM) 5 MG tablet Take 1 tablet (5 mg total) by mouth 2 (two) times daily. 11/04/19   Donnal Moat T, PA-C  diclofenac Sodium (VOLTAREN) 1 % GEL Apply 4 g topically 4 (four) times daily. 05/30/20 06/29/20  Fatima Blank, MD  fluticasone (FLONASE) 50 MCG/ACT nasal spray Place 1 spray into both nostrils daily as needed for allergies or rhinitis.    [provider]  fluvoxaMINE (LUVOX) 100 MG tablet Take 2 tablets (200 mg total) by mouth at bedtime. 07/27/19   Mozingo, Berdie Ogren, NP  gabapentin (NEURONTIN) 400 MG capsule TAKE 2 CAPSULES BY MOUTH IN THE MORNING , 2 CAPSULES AT LUNCH, AND 4 CAPSULES EVERY NIGHT AT BEDTIME 02/13/20   Donnal Moat T, PA-C  hydrOXYzine (ATARAX/VISTARIL) 25 MG tablet Take 1 tablet (25 mg total) by mouth 2 (two)  times daily. 07/27/19   Mozingo, Berdie Ogren, NP  ipratropium-albuterol (DUONEB) 0.5-2.5 (3) MG/3ML SOLN Inhale 3 mLs into the lungs every 6 (six) hours as needed (SOB, wheezing).     [provider]  ketorolac (TORADOL) 10 MG tablet TK 1 T PO  Q 6 H PRF P 10/16/17   [provider]  levothyroxine (LEVOTHROID) 88 MCG tablet Take 112 mcg by mouth daily before breakfast.     [provider]  levothyroxine (SYNTHROID, LEVOTHROID) 100 MCG tablet Take 112 mcg by mouth daily before breakfast.  09/09/17   [provider]  linaclotide (LINZESS) 290 MCG CAPS capsule Take 290 mcg by mouth daily before breakfast.    [provider]  LYRICA 100 MG capsule  10/23/17   [provider]  montelukast (SINGULAIR) 10 MG tablet Take 10 mg by mouth at bedtime.  02/05/16   [provider]  montelukast (SINGULAIR) 10 MG tablet TAKE 1 TABLET BY MOUTH NIGHTLY 02/05/16   [provider]  Multiple Vitamin (MULTI VITAMIN) TABS Take 1 tablet by mouth daily. 09/23/19   Donnal Moat T, PA-C  OLANZapine (ZYPREXA) 20 MG tablet TAKE 1 TABLET(20 MG) BY MOUTH AT BEDTIME 02/07/20   Hurst, Teresa T, PA-C  omeprazole (PRILOSEC) 40 MG capsule Take 40 mg by mouth daily.  07/29/17   [provider]  OnabotulinumtoxinA (BOTOX IJ) Inject as directed every 3 (three) months.    [provider]  ondansetron (ZOFRAN ODT) 4 MG disintegrating tablet Take 4 mg by mouth every 8 (eight) hours as needed for nausea.  02/05/16   [provider]  prazosin (MINIPRESS) 2 MG capsule Take 1 capsule (2 mg total) by mouth at bedtime. 02/07/20   Addison Lank, PA-C  predniSONE (DELTASONE) 10 MG tablet Take 10 mg by mouth daily with breakfast.  10/23/17   [provider]  promethazine (PHENERGAN) 12.5 MG tablet Take 12.5 mg by mouth every 6 (six) hours as needed for nausea or vomiting.  10/29/17   [provider]  topiramate (TOPAMAX) 100 MG tablet Take 1.5 tablets (150 mg total) by mouth 2 (two) times daily. Patient not taking: Reported on 12/08/2019 09/23/19   Donnal Moat T, PA-C  traMADol (ULTRAM) 50 MG tablet Take 50 mg by mouth every 6 (six) hours as needed for moderate pain (kidney stones).    [provider]  traZODone (DESYREL) 100 MG tablet 1 and 1/2 tabs hs 02/07/20   Donnal Moat T, PA-C  zonisamide (ZONEGRAN) 50 MG capsule Take by mouth. 02/18/20 02/17/21  [provider]                                                                                                                                     Past Surgical History Past Surgical History:  Procedure Laterality Date  . ABDOMINAL HYSTERECTOMY    . CHOLECYSTECTOMY    . CYSTOSCOPY WITH RETROGRADE  PYELOGRAM, URETEROSCOPY AND STENT PLACEMENT Left 12/26/2014   Procedure: CYSTOSCOPY WITH RETROGRADE PYELOGRAM, URETEROSCOPY AND STENT PLACEMENT;  Surgeon: Bernestine Amass, MD;  Location[facility];  Service: Urology;  Laterality: Left;  . CYSTOSCOPY/RETROGRADE/URETEROSCOPY Right 03/31/2013   Procedure: CYSTOSCOPY/RETROGRADE/URETEROSCOPY;  Surgeon: Bernestine Amass, MD;  Location: [facility];  Service: Urology;  Laterality: Right;  . 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  . HOLMIUM LASER APPLICATION Right 9/52/8413   Procedure: HOLMIUM LASER APPLICATION;  Surgeon: Bernestine Amass, MD;  Location: [facility];  Service: Urology;  Laterality: Right;  . HOLMIUM LASER APPLICATION Left 12/22/4008   Procedure: HOLMIUM LASER APPLICATION;  Surgeon: Bernestine Amass, MD;  Location: [facility];  Service: Urology;  Laterality: Left;  . LAPAROSCOPIC ASSISTED VAGINAL HYSTERECTOMY  MARCH 2014   W/ BILATERAL SALPINGOOPHORECTOMY  . LAPAROSCOPIC GASTRIC BANDING  SEPT 2011  . TONSILLECTOMY AND ADENOIDECTOMY  AGE 30'S  . URETEROLITHOTOMY  NOV 2013   Family History Family History  Problem Relation Age of Onset  . Diabetes Mother   . Hypertension Mother   . Cancer Mother   . Hypertension Father   . COPD Father   . Deafness Father   . Emphysema Father     Social History Social History   Tobacco Use  . Smoking status: Never Smoker  . Smokeless tobacco: Never Used  Substance Use Topics  . Alcohol use: No  . Drug use: No   Allergies Clindamycin/lincomycin, Nsaids, Oxycodone, and Percocet [oxycodone-acetaminophen]  Review of Systems Review of Systems  Constitutional: Negative for fever.  Gastrointestinal: Negative for bowel incontinence.  Genitourinary: Negative for  bladder incontinence and dysuria.  Musculoskeletal: Positive for back pain.  Neurological: Positive for paresthesias. Negative for weakness and headaches.   All other systems are reviewed and are negative for acute change except as noted in the HPI  Physical Exam Vital Signs  I have reviewed the triage vital signs BP 117/80 (BP Location: Left Arm)   Pulse 85   Temp 98.3 F (36.8 C)   Resp 20   Ht 5\' 3"  (1.6 m)   Wt 62.1 kg   SpO2 100%   BMI 24.27 kg/m   Physical Exam Vitals reviewed.  Constitutional:      General: She is not in acute distress.    Appearance: She is well-developed. She is not diaphoretic.  HENT:     Head: Normocephalic and atraumatic.     Right Ear: External ear normal.     Left Ear: External ear normal.     Nose: Nose normal.  Eyes:     General: No scleral icterus.    Conjunctiva/sclera: Conjunctivae normal.  Neck:     Trachea: Phonation normal.  Cardiovascular:     Rate and Rhythm: Normal rate and regular rhythm.  Pulmonary:     Effort: Pulmonary effort is normal. No respiratory distress.     Breath sounds: No stridor.  Abdominal:     General: There is no distension.  Musculoskeletal:        General: Normal range of motion.     Cervical back: Normal range of motion.     Thoracic back: Tenderness present.     Lumbar back: Deformity (curved lumbar spine towards left), spasms and tenderness present. No bony tenderness.       Back:  Neurological:     Mental Status: She is alert and oriented to person, place, and time.  Psychiatric:  Behavior: Behavior normal.     ED Results and Treatments Labs (all labs ordered are listed, but only abnormal results are displayed) Labs Reviewed - No data to display                                                                                                                       EKG  EKG Interpretation  Date/Time:    Ventricular Rate:    PR Interval:    QRS Duration:   QT Interval:    QTC  Calculation:   R Axis:     Text Interpretation:        Radiology No results found.  Pertinent labs & imaging results that were available during my care of the patient were reviewed by me and considered in my medical decision making (see chart for details).  Medications Ordered in ED Medications - No data to display                                                                                                                                  Procedures Procedures  (including critical care time)  Medical Decision Making / ED Course I have reviewed the nursing notes for this encounter and the patient's prior records (if available in EHR or on provided paperwork).   Morgan [name] was evaluated in Emergency Department on 05/31/2020 for the symptoms described in the history of present illness. She was evaluated in the context of the global COVID-19 pandemic, which necessitated consideration that the patient might be at risk for infection with the SARS-CoV-2 virus that causes COVID-19. Institutional protocols and algorithms that pertain to the evaluation of patients at risk for COVID-19 are in a state of rapid change based on information released by regulatory bodies including the CDC and federal and state organizations. These policies and algorithms were followed during the patient's care in the ED.    49 y.o. female presents with back pain in lumbar area for 78month. No acute traumatic onset. No red flag symptoms of fever, weight loss, saddle anesthesia, weakness, fecal/urinary incontinence or urinary retention.   Suspect MSK etiology. No indication for imaging emergently. Patient was recommended to take short course of scheduled NSAIDs and engage in early mobility as definitive treatment. Return precautions discussed for worsening or new concerning symptoms.    Final Clinical Impression(s) / ED Diagnoses Final diagnoses:  Strain of lumbar region, initial encounter  Muscle  spasm of  back    The patient appears reasonably screened and/or stabilized for discharge and I doubt any other medical condition or other [facility] requiring further screening, evaluation, or treatment in the ED at this time prior to discharge. Safe for discharge with strict return precautions.  Disposition: Discharge  Condition: Good  I have discussed the results, Dx and Tx plan with the patient/family who expressed understanding and agree(s) with the plan. Discharge instructions discussed at length. The patient/family was given strict return precautions who verbalized understanding of the instructions. No further questions at time of discharge.    ED Discharge Orders         Ordered    diclofenac Sodium (VOLTAREN) 1 % GEL  4 times daily     Discontinue  Reprint     05/30/20 2336            Follow Up: [facility], [place] [address] [phone]        This chart was dictated using voice recognition software.  Despite best efforts to proofread,  errors can occur which can change the documentation meaning.   Fatima Blank, MD 05/31/20 [phone]

## 2020-05-30 NOTE — ED Triage Notes (Signed)
Chronic back pain, states it now radiates left leg that began tonight. Ambulatory to triage

## 2020-05-31 ENCOUNTER — Other Ambulatory Visit: Payer: Self-pay | Admitting: Physician Assistant

## 2020-06-01 NOTE — Telephone Encounter (Signed)
Patient called today also asking for a 90 day supply of Valium

## 2020-06-01 NOTE — Telephone Encounter (Signed)
Patient hasn't rescheduled her 03/31/2020 apt

## 2020-06-01 NOTE — Telephone Encounter (Signed)
PT is asking if T. Hurst can fill her Valium which she has RX ed before. Please send to new pharmacy, [facility]. Has to be a 90 day supply.

## 2020-06-02 ENCOUNTER — Other Ambulatory Visit: Payer: Self-pay | Admitting: Physician Assistant

## 2020-06-02 MED ORDER — DIAZEPAM 5 MG PO TABS: 5.0000 mg | ORAL_TABLET | Freq: Two times a day (BID) | ORAL | 0 refills | Status: DC | PRN

## 2020-06-02 NOTE — Telephone Encounter (Signed)
Called pt and made an appt for 06/28/20. Pt understands. Please send in all RX refills for 30 days to [facility] on [address], in [place]. Thanks.

## 2020-06-02 NOTE — Telephone Encounter (Signed)
I sent in a 30-day supply on each of her medications.  No refills.  Please get the local pharmacy she would like for me to send the Valium to.  I will give her enough for 30 days with no refills on that.  She must make an appointment and keep it or else she will not get any more refills.  Thanks

## 2020-06-02 NOTE — Telephone Encounter (Signed)
Beth please see Teresa's message about her making apt and needing local pharmacy for Valium

## 2020-06-02 NOTE — Telephone Encounter (Signed)
Prescription was sent.  No action needed.

## 2020-06-05 ENCOUNTER — Other Ambulatory Visit: Payer: Self-pay

## 2020-06-05 MED ORDER — TRAZODONE HCL 100 MG PO TABS: ORAL_TABLET | ORAL | 0 refills | Status: DC

## 2020-06-28 ENCOUNTER — Encounter: Payer: Self-pay | Admitting: Physician Assistant

## 2020-06-28 ENCOUNTER — Telehealth: Payer: Self-pay | Admitting: Physician Assistant

## 2020-06-28 ENCOUNTER — Telehealth (INDEPENDENT_AMBULATORY_CARE_PROVIDER_SITE_OTHER): Payer: Medicare Other | Admitting: Physician Assistant

## 2020-06-28 DIAGNOSIS — Z79899 Other long term (current) drug therapy: Secondary | ICD-10-CM

## 2020-06-28 DIAGNOSIS — G4700 Insomnia, unspecified: Secondary | ICD-10-CM

## 2020-06-28 DIAGNOSIS — F411 Generalized anxiety disorder: Secondary | ICD-10-CM | POA: Diagnosis not present

## 2020-06-28 DIAGNOSIS — F4310 Post-traumatic stress disorder, unspecified: Secondary | ICD-10-CM | POA: Diagnosis not present

## 2020-06-28 DIAGNOSIS — F319 Bipolar disorder, unspecified: Secondary | ICD-10-CM

## 2020-06-28 MED ORDER — PRAZOSIN HCL 2 MG PO CAPS: 2.0000 mg | ORAL_CAPSULE | Freq: Every day | ORAL | 1 refills | Status: DC

## 2020-06-28 MED ORDER — DIAZEPAM 5 MG PO TABS: 5.0000 mg | ORAL_TABLET | Freq: Two times a day (BID) | ORAL | 1 refills | Status: DC | PRN

## 2020-06-28 MED ORDER — TRAZODONE HCL 100 MG PO TABS: ORAL_TABLET | ORAL | 1 refills | Status: DC

## 2020-06-28 MED ORDER — OLANZAPINE 20 MG PO TABS: ORAL_TABLET | ORAL | 1 refills | Status: DC

## 2020-06-28 MED ORDER — BUPROPION HCL ER (XL) 150 MG PO TB24: 150.0000 mg | ORAL_TABLET | Freq: Every day | ORAL | 1 refills | Status: DC

## 2020-06-28 MED ORDER — BUSPIRONE HCL 30 MG PO TABS: 30.0000 mg | ORAL_TABLET | Freq: Two times a day (BID) | ORAL | 1 refills | Status: DC

## 2020-06-28 MED ORDER — FLUVOXAMINE MALEATE 100 MG PO TABS: 200.0000 mg | ORAL_TABLET | Freq: Every day | ORAL | 1 refills | Status: DC

## 2020-06-28 NOTE — Telephone Encounter (Signed)
[name], cronin are scheduled for a virtual visit with your provider today.    Just as we do with appointments in the office, we must obtain your consent to participate.  Your consent will be active for this visit and any virtual visit you may have with one of our providers in the next 365 days.    If you have a MyChart account, I can also send a copy of this consent to you electronically.  All virtual visits are billed to your insurance company just like a traditional visit in the office.  As this is a virtual visit, video technology does not allow for your provider to perform a traditional examination.  This may limit your provider's ability to fully assess your condition.  If your provider identifies any concerns that need to be evaluated in person or the need to arrange testing such as labs, EKG, etc, we will make arrangements to do so.    Although advances in technology are sophisticated, we cannot ensure that it will always work on either your end or our end.  If the connection with a video visit is poor, we may have to switch to a telephone visit.  With either a video or telephone visit, we are not always able to ensure that we have a secure connection.   I need to obtain your verbal consent now.   Are you willing to proceed with your visit today?   Morgan [name] has provided verbal consent on 06/28/2020 for a virtual visit (video or telephone).   Donnal Moat, PA-C 06/28/2020  1:39 PM

## 2020-06-28 NOTE — Progress Notes (Signed)
[facility] Check  Patient ID: Morgan [name],  MRN: [id number]  PCP: Vev[facility]  Date of Evaluation: 06/28/2020 Time spent:20 minutes  Chief Complaint:  Chief Complaint    Follow-up     Virtual Visit via Telehealth  I connected with patient by a telephone with their informed consent, and verified patient privacy and that I am speaking with the correct person using two identifiers.  I am private, in my office and the patient is at home.  I discussed the limitations, risks, security and privacy concerns of performing an evaluation and management service by phone and the availability of in person appointments. I also discussed with the patient that there may be a patient responsible charge related to this service. The patient expressed understanding and agreed to proceed.   I discussed the assessment and treatment plan with the patient. The patient was provided an opportunity to ask questions and all were answered. The patient agreed with the plan and demonstrated an understanding of the instructions.   The patient was advised to call back or seek an in-person evaluation if the symptoms worsen or if the condition fails to improve as anticipated.  I provided 20 minutes of non-face-to-face time during this encounter.  HISTORY/CURRENT STATUS: HPI For routine med check.  Since her last visit, her mother-in-law passed away.  Her daughter's best friend, who was like her daughter to Weda, also passed away at 42 years old.  Patient has also hurt her back.  She has an MRI scheduled later this week.  Even through all of it, she feels that her medications are working and mentally she is doing well.  She is able to enjoy things.  Feels like her medications are working.  Energy and motivation are pretty good except she is limited because of hurting her back recently.  She is not isolating.  Hygiene is normal.  Not crying easily.  Denies suicidal or homicidal  thoughts.  She denies increased energy with decreased need for sleep, no increased chattiness, no impulsivity or risky behavior, no increased libido or spending, no hallucinations or grandiosity.  No paranoia.  She still gets anxious at times but it is mostly generalized and not so much panic attacks.  She feels that the preventative medication such as BuSpar and Luvox are helpful.  She takes the hydroxyzine and Valium as rescue medications and they help as well.  Denies dizziness, syncope, seizures, numbness, tingling, tremor, tics, unsteady gait, slurred speech, confusion. Denies muscle or joint pain, stiffness, or dystonia.  Individual Medical History/ Review of Systems: Changes? :Yes See HPI  Past medications for mental health diagnoses include: Zoloft, trazodone, gabapentin, Risperdal, hydroxyzine, prazosin, nortriptyline, Adderall, Rexulti, Topamax, Latuda,Ingrezzalithium, Valium, clozapine, BuSpar, Zyprexa  Allergies: Clindamycin/lincomycin, Nsaids, Oxycodone, and Percocet [oxycodone-acetaminophen]  Current Medications:  Current Outpatient Medications:  .  AIMOVIG 70 MG/ML SOAJ, Inject 70 mg into the muscle every 30 (thirty) days. , Disp: , Rfl: 11 .  AMITIZA 24 MCG capsule, Take 24 mcg by mouth 2 (two) times daily with a meal. , Disp: , Rfl:  .  buPROPion (WELLBUTRIN XL) 150 MG 24 hr tablet, Take 1 tablet (150 mg total) by mouth daily., Disp: 90 tablet, Rfl: 1 .  busPIRone (BUSPAR) 30 MG tablet, Take 1 tablet (30 mg total) by mouth 2 (two) times daily., Disp: 180 tablet, Rfl: 1 .  cyclobenzaprine (FLEXERIL) 10 MG tablet, Take 1 tablet (10 mg total) by mouth 3 (three) times daily as needed for muscle  spasms., Disp: 270 tablet, Rfl: 0 .  diazepam (VALIUM) 5 MG tablet, Take 1 tablet (5 mg total) by mouth every 12 (twelve) hours as needed for anxiety., Disp: 180 tablet, Rfl: 1 .  diclofenac Sodium (VOLTAREN) 1 % GEL, Apply 4 g topically 4 (four) times daily., Disp: 350 g, Rfl: 1 .   fluticasone (FLONASE) 50 MCG/ACT nasal spray, Place 1 spray into both nostrils daily as needed for allergies or rhinitis., Disp: , Rfl:  .  fluvoxaMINE (LUVOX) 100 MG tablet, Take 2 tablets (200 mg total) by mouth at bedtime., Disp: 180 tablet, Rfl: 1 .  gabapentin (NEURONTIN) 400 MG capsule, TAKE 2 CAPSULES BY MOUTH IN THE MORNING , 2 CAPSULES AT LUNCH, AND 4 CAPSULES EVERY NIGHT AT BEDTIME, Disp: 720 capsule, Rfl: 3 .  hydrOXYzine (ATARAX/VISTARIL) 25 MG tablet, Take 1 tablet (25 mg total) by mouth 2 (two) times daily., Disp: 180 tablet, Rfl: 1 .  ipratropium-albuterol (DUONEB) 0.5-2.5 (3) MG/3ML SOLN, Inhale 3 mLs into the lungs every 6 (six) hours as needed (SOB, wheezing). , Disp: , Rfl:  .  ketorolac (TORADOL) 10 MG tablet, TK 1 T PO  Q 6 H PRF P, Disp: , Rfl: 0 .  levothyroxine (LEVOTHROID) 88 MCG tablet, Take 112 mcg by mouth daily before breakfast. , Disp: , Rfl:  .  linaclotide (LINZESS) 290 MCG CAPS capsule, Take 290 mcg by mouth daily before breakfast., Disp: , Rfl:  .  LYRICA 100 MG capsule, , Disp: , Rfl:  .  montelukast (SINGULAIR) 10 MG tablet, Take 10 mg by mouth at bedtime. , Disp: , Rfl:  .  montelukast (SINGULAIR) 10 MG tablet, TAKE 1 TABLET BY MOUTH NIGHTLY, Disp: , Rfl:  .  Multiple Vitamin (MULTI VITAMIN) TABS, Take 1 tablet by mouth daily., Disp: 90 tablet, Rfl: 1 .  OLANZapine (ZYPREXA) 20 MG tablet, TAKE 1 TABLET BY MOUTH AT  BEDTIME, Disp: 90 tablet, Rfl: 1 .  omeprazole (PRILOSEC) 40 MG capsule, Take 40 mg by mouth daily. , Disp: , Rfl:  .  OnabotulinumtoxinA (BOTOX IJ), Inject as directed every 3 (three) months., Disp: , Rfl:  .  ondansetron (ZOFRAN ODT) 4 MG disintegrating tablet, Take 4 mg by mouth every 8 (eight) hours as needed for nausea. , Disp: , Rfl:  .  prazosin (MINIPRESS) 2 MG capsule, Take 1 capsule (2 mg total) by mouth at bedtime., Disp: 90 capsule, Rfl: 1 .  predniSONE (DELTASONE) 10 MG tablet, Take 10 mg by mouth daily with breakfast. , Disp: , Rfl:  .   promethazine (PHENERGAN) 12.5 MG tablet, Take 12.5 mg by mouth every 6 (six) hours as needed for nausea or vomiting. , Disp: , Rfl:  .  traMADol (ULTRAM) 50 MG tablet, Take 50 mg by mouth every 6 (six) hours as needed for moderate pain (kidney stones)., Disp: , Rfl:  .  traZODone (DESYREL) 100 MG tablet, TAKE 1 AND 1/2 TABLETS BY  MOUTH AT BEDTIME, Disp: 135 tablet, Rfl: 1 .  zonisamide (ZONEGRAN) 50 MG capsule, Take by mouth., Disp: , Rfl:  .  levothyroxine (SYNTHROID, LEVOTHROID) 100 MCG tablet, Take 112 mcg by mouth daily before breakfast. , Disp: , Rfl:  .  topiramate (TOPAMAX) 100 MG tablet, Take 1.5 tablets (150 mg total) by mouth 2 (two) times daily. (Patient not taking: Reported on 12/08/2019), Disp: 270 tablet, Rfl: 1 Medication Side Effects: none  Family Medical/ Social History: Changes?  Mother-in-law passed away in the past few months.  Also a young  woman who was like another daughter to her also passed away.  MENTAL HEALTH EXAM:  There were no vitals taken for this visit.There is no height or weight on file to calculate BMI.  General Appearance: unable to assess  Eye Contact:  unable to assess  Speech:  Clear and Coherent and Normal Rate  Volume:  Normal  Mood:  Euthymic  Affect:  unable to assess  Thought Process:  Goal Directed and Descriptions of Associations: Intact  Orientation:  Full (Time, Place, and Person)  Thought Content: Logical   Suicidal Thoughts:  No  Homicidal Thoughts:  No  Memory:  WNL  Judgement:  Good  Insight:  Good  Psychomotor Activity:  unable to assess  Concentration:  Concentration: Good  Recall:  Good  Fund of Knowledge: Good  Language: Good  Assets:  Desire for Improvement  ADL's:  Intact  Cognition: WNL  Prognosis:  Good   She has had recent labs (see on chart) due to the back pain and had increased LFTs.  Her glucose was 112 approximately 1 month ago was not fasting.  She has not had a hemoglobin A1c or lipid panel in an uncertain amount  of time.  DIAGNOSES:    ICD-10-CM   1. Bipolar I disorder ([place])  F31.9   2. Encounter for long-term (current) use of medications  Z79.899 Lipid panel    Basic metabolic panel    Hemoglobin A1c  3. PTSD (post-traumatic stress disorder)  F43.10   4. Generalized anxiety disorder  F41.1   5. Insomnia, unspecified type  G47.00     Receiving Psychotherapy: No    RECOMMENDATIONS:  PDMP was reviewed. I provided 20 minutes of nonface-to-face time during this encounter. My condolences for her losses.  I am glad she is doing well mentally though.   Continue Wellbutrin XL 150 mg p.o. daily. Continue BuSpar 30 mg, 1 p.o. twice daily. Continue Valium 5 mg, 1 p.o. twice daily as needed. Continue Luvox 100 mg, 2 p.o. nightly. Continue gabapentin 400 mg, 2 p.o. every morning, 2 p.o. at lunch, and 4 p.o. nightly. Continue hydroxyzine 25 mg, 1 p.o. twice daily as needed. Continue Zyprexa 20 mg, 1 p.o. nightly. Continue prazosin 2 mg, 1 p.o. nightly. Continue trazodone 100 mg, 1.5 pills nightly. Labs ordered as above.  She will have these done at her PCPs office.  We will fax the orders to them. Return in 3 months.  Donnal Moat, PA-C

## 2020-11-14 ENCOUNTER — Other Ambulatory Visit: Payer: Self-pay | Admitting: Physician Assistant

## 2020-12-18 ENCOUNTER — Telehealth: Payer: Self-pay | Admitting: Physician Assistant

## 2020-12-18 NOTE — Telephone Encounter (Signed)
Please let her know we need to discuss this at our visit. I don't know what she's meaning about the drug that replaced Topamax. She's 3 months overdue for a f/u appt too.

## 2020-12-18 NOTE — Telephone Encounter (Signed)
Pt says she would like to go back on Topamax. She had requested RF and I reviewed that it looked like she was off of it. She said the medicine I'm taking in place of Topamax is making me sick and I need to get off of it. I could not confirm which med that is. Topamax 150mg  bid- thru [facility]

## 2020-12-20 ENCOUNTER — Other Ambulatory Visit: Payer: Self-pay | Admitting: Physician Assistant

## 2020-12-20 MED ORDER — TOPIRAMATE 100 MG PO TABS: 150.0000 mg | ORAL_TABLET | Freq: Two times a day (BID) | ORAL | 0 refills | Status: DC

## 2020-12-20 NOTE — Telephone Encounter (Signed)
Rtc to patient and she reports she restarted Topamax, she is currently taking 150 mg bid. This is what she was on previously and reports it helps and she's doing well on it, she can not remember the medication she was put on to replace it. She was at work and didn't have it with her, but she has stopped it since. When asking when she thought it was changed she reports not knowing. Looking back through her notes I do not see where it was changed. Reports she has tried to get an apt with [facility] but is not able to get in until March. She is almost out of medication.  Asking for a few days to be sent to [facility] in [place] and a 90 day supply to [facility].   Topamax 100 mg 1.5 tablets bid was noted on med profile in 2020.

## 2020-12-20 NOTE — Telephone Encounter (Signed)
Prescription sent

## 2020-12-20 NOTE — Telephone Encounter (Signed)
Noted thank you

## 2020-12-29 ENCOUNTER — Other Ambulatory Visit: Payer: Self-pay | Admitting: Physician Assistant

## 2021-01-23 ENCOUNTER — Ambulatory Visit (INDEPENDENT_AMBULATORY_CARE_PROVIDER_SITE_OTHER): Payer: Medicare Other | Admitting: Physician Assistant

## 2021-01-23 ENCOUNTER — Encounter: Payer: Self-pay | Admitting: Physician Assistant

## 2021-01-23 ENCOUNTER — Other Ambulatory Visit: Payer: Self-pay

## 2021-01-23 DIAGNOSIS — G4700 Insomnia, unspecified: Secondary | ICD-10-CM

## 2021-01-23 DIAGNOSIS — F319 Bipolar disorder, unspecified: Secondary | ICD-10-CM | POA: Diagnosis not present

## 2021-01-23 DIAGNOSIS — F411 Generalized anxiety disorder: Secondary | ICD-10-CM | POA: Diagnosis not present

## 2021-01-23 DIAGNOSIS — F4310 Post-traumatic stress disorder, unspecified: Secondary | ICD-10-CM | POA: Diagnosis not present

## 2021-01-23 MED ORDER — OLANZAPINE 20 MG PO TABS: 20.0000 mg | ORAL_TABLET | Freq: Every day | ORAL | 1 refills | Status: DC

## 2021-01-23 MED ORDER — BUSPIRONE HCL 30 MG PO TABS: 30.0000 mg | ORAL_TABLET | Freq: Two times a day (BID) | ORAL | 1 refills | Status: DC

## 2021-01-23 MED ORDER — DIAZEPAM 5 MG PO TABS: 5.0000 mg | ORAL_TABLET | Freq: Two times a day (BID) | ORAL | 1 refills | Status: DC | PRN

## 2021-01-23 MED ORDER — GABAPENTIN 400 MG PO CAPS: ORAL_CAPSULE | ORAL | 1 refills | Status: DC

## 2021-01-23 MED ORDER — FLUVOXAMINE MALEATE 100 MG PO TABS: 200.0000 mg | ORAL_TABLET | Freq: Every day | ORAL | 1 refills | Status: DC

## 2021-01-23 MED ORDER — BUPROPION HCL ER (XL) 150 MG PO TB24: 150.0000 mg | ORAL_TABLET | Freq: Every day | ORAL | 1 refills | Status: DC

## 2021-01-23 MED ORDER — TRAZODONE HCL 100 MG PO TABS
150.0000 mg | ORAL_TABLET | Freq: Every evening | ORAL | 1 refills | Status: DC | PRN
Start: 2021-01-23 — End: 2021-07-05

## 2021-01-23 NOTE — Progress Notes (Signed)
[facility] Med Check  Patient ID: Morgan [name],  MRN: [id number]  PCP: [place]  Date of Evaluation: 01/23/2021 Time spent:45 minutes  Chief Complaint:  Chief Complaint    Anxiety; Depression; Insomnia; Follow-up      HISTORY/CURRENT STATUS: HPI For routine med check.  Morgan [name] is doing well.  She is able to enjoy things.  Denies decreased energy or motivation.  Appetite has not changed.  No weight gain or loss.  No extreme sadness, tearfulness, or feelings of hopelessness.  She got saved since our last visit!  She is very happy and will be baptized tomorrow evening.  Denies any changes in concentration, making decisions or remembering things.  Sleeps good as long as she has the trazodone.  Denies suicidal or homicidal thoughts.  Anxiety is well-controlled with Valium. Has been stressed with her meds, b/c so expensive. Is now getting help with her meds and is not having to skip some of them because unable to afford.  Patient denies increased energy with decreased need for sleep, no increased talkativeness, no racing thoughts, no impulsivity or risky behaviors, no increased spending, no increased libido, no grandiosity, no increased irritability or anger, and no hallucinations.  Denies dizziness, syncope, seizures, numbness, tingling, tremor, tics, unsteady gait, slurred speech, confusion. Denies muscle or joint pain, stiffness, or dystonia.  Individual Medical History/ Review of Systems: Changes? :No   Past medications for mental health diagnoses include: Zoloft, trazodone, gabapentin, Risperdal, hydroxyzine, prazosin, nortriptyline, Adderall, Rexulti, Topamax, Latuda,Ingrezzalithium, Valium, clozapine, BuSpar, Zyprexa  Allergies: Clindamycin/lincomycin, Nsaids, Oxycodone, and Percocet [oxycodone-acetaminophen]  Current Medications:  Current Outpatient Medications:  .  AIMOVIG 70 MG/ML SOAJ, Inject 70 mg into the muscle every 30 (thirty) days. ,  Disp: , Rfl: 11 .  AMITIZA 24 MCG capsule, Take 24 mcg by mouth 2 (two) times daily with a meal. , Disp: , Rfl:  .  cyclobenzaprine (FLEXERIL) 10 MG tablet, Take 1 tablet (10 mg total) by mouth 3 (three) times daily as needed for muscle spasms., Disp: 270 tablet, Rfl: 0 .  fluticasone (FLONASE) 50 MCG/ACT nasal spray, Place 1 spray into both nostrils daily as needed for allergies or rhinitis., Disp: , Rfl:  .  hydrOXYzine (ATARAX/VISTARIL) 25 MG tablet, Take 1 tablet (25 mg total) by mouth 2 (two) times daily., Disp: 180 tablet, Rfl: 1 .  ipratropium-albuterol (DUONEB) 0.5-2.5 (3) MG/3ML SOLN, Inhale 3 mLs into the lungs every 6 (six) hours as needed (SOB, wheezing). , Disp: , Rfl:  .  ketorolac (TORADOL) 10 MG tablet, TK 1 T PO  Q 6 H PRF P, Disp: , Rfl: 0 .  levothyroxine (SYNTHROID) 88 MCG tablet, Take 112 mcg by mouth daily before breakfast. , Disp: , Rfl:  .  levothyroxine (SYNTHROID, LEVOTHROID) 100 MCG tablet, Take 112 mcg by mouth daily before breakfast. , Disp: , Rfl:  .  linaclotide (LINZESS) 290 MCG CAPS capsule, Take 290 mcg by mouth daily before breakfast., Disp: , Rfl:  .  LYRICA 100 MG capsule, , Disp: , Rfl:  .  montelukast (SINGULAIR) 10 MG tablet, Take 10 mg by mouth at bedtime. , Disp: , Rfl:  .  montelukast (SINGULAIR) 10 MG tablet, TAKE 1 TABLET BY MOUTH NIGHTLY, Disp: , Rfl:  .  Multiple Vitamin (MULTI VITAMIN) TABS, Take 1 tablet by mouth daily., Disp: 90 tablet, Rfl: 1 .  omeprazole (PRILOSEC) 40 MG capsule, Take 40 mg by mouth daily. , Disp: , Rfl:  .  OnabotulinumtoxinA (BOTOX IJ), Inject  as directed every 3 (three) months., Disp: , Rfl:  .  ondansetron (ZOFRAN-ODT) 4 MG disintegrating tablet, Take 4 mg by mouth every 8 (eight) hours as needed for nausea. , Disp: , Rfl:  .  prazosin (MINIPRESS) 2 MG capsule, TAKE 1 CAPSULE BY MOUTH AT  BEDTIME, Disp: 30 capsule, Rfl: 11 .  promethazine (PHENERGAN) 12.5 MG tablet, Take 12.5 mg by mouth every 6 (six) hours as needed for  nausea or vomiting. , Disp: , Rfl:  .  topiramate (TOPAMAX) 100 MG tablet, Take 1.5 tablets (150 mg total) by mouth 2 (two) times daily., Disp: 270 tablet, Rfl: 0 .  topiramate (TOPAMAX) 100 MG tablet, Take 1.5 tablets (150 mg total) by mouth 2 (two) times daily., Disp: 45 tablet, Rfl: 0 .  traMADol (ULTRAM) 50 MG tablet, Take 50 mg by mouth every 6 (six) hours as needed for moderate pain (kidney stones)., Disp: , Rfl:  .  zonisamide (ZONEGRAN) 50 MG capsule, Take by mouth., Disp: , Rfl:  .  buPROPion (WELLBUTRIN XL) 150 MG 24 hr tablet, Take 1 tablet (150 mg total) by mouth daily., Disp: 90 tablet, Rfl: 1 .  busPIRone (BUSPAR) 30 MG tablet, Take 1 tablet (30 mg total) by mouth 2 (two) times daily., Disp: 180 tablet, Rfl: 1 .  diazepam (VALIUM) 5 MG tablet, Take 1 tablet (5 mg total) by mouth every 12 (twelve) hours as needed for anxiety., Disp: 180 tablet, Rfl: 1 .  fluvoxaMINE (LUVOX) 100 MG tablet, Take 2 tablets (200 mg total) by mouth at bedtime., Disp: 180 tablet, Rfl: 1 .  gabapentin (NEURONTIN) 400 MG capsule, TAKE 2 CAPSULES BY MOUTH IN THE MORNING , 2 CAPSULES AT LUNCH, AND 4 CAPSULES EVERY NIGHT AT BEDTIME, Disp: 720 capsule, Rfl: 1 .  OLANZapine (ZYPREXA) 20 MG tablet, Take 1 tablet (20 mg total) by mouth at bedtime., Disp: 90 tablet, Rfl: 1 .  predniSONE (DELTASONE) 10 MG tablet, Take 10 mg by mouth daily with breakfast.  (Patient not taking: Reported on 01/23/2021), Disp: , Rfl:  .  traZODone (DESYREL) 100 MG tablet, Take 1.5 tablets (150 mg total) by mouth at bedtime as needed for sleep., Disp: 135 tablet, Rfl: 1 Medication Side Effects: none  Family Medical/ Social History: Changes?  No   MENTAL HEALTH EXAM:  There were no vitals taken for this visit.There is no height or weight on file to calculate BMI.  General Appearance: Casual, Neat and Well Groomed  Eye Contact:  Good  Speech:  Clear and Coherent and Normal Rate  Volume:  Normal  Mood:  Euthymic  Affect:  Appropriate   Thought Process:  Goal Directed and Descriptions of Associations: Intact  Orientation:  Full (Time, Place, and Person)  Thought Content: Logical   Suicidal Thoughts:  No  Homicidal Thoughts:  No  Memory:  WNL  Judgement:  Good  Insight:  Good  Psychomotor Activity:  Normal  Concentration:  Concentration: Good  Recall:  Good  Fund of Knowledge: Good  Language: Good  Assets:  Desire for Improvement  ADL's:  Intact  Cognition: WNL  Prognosis:  Good   Labs  07/18/2020 Glucose 89 Total cholesterol 182, triglycerides 64, HDL 56, LDL 126  DIAGNOSES:    ICD-10-CM   1. Bipolar I disorder ([place])  F31.9   2. PTSD (post-traumatic stress disorder)  F43.10   3. Insomnia, unspecified type  G47.00   4. Generalized anxiety disorder  F41.1     Receiving Psychotherapy: No    RECOMMENDATIONS:  PDMP was reviewed. I provided 45 minutes of face-to-face time during this encounter, including time spent before and after the visit in chart review with the focus on labs monitoring any metabolic problems, and in which we discussed her response to medications.  She is doing well at the current doses so no changes will be made. Continue Wellbutrin XL 150 mg p.o. daily. Continue BuSpar 30 mg, 1 p.o. twice daily. Continue Valium 5 mg, 1 p.o. twice daily as needed. Continue Luvox 100 mg, 2 p.o. nightly. Continue gabapentin 400 mg, 2 p.o. every morning, 2 p.o. at lunch, and 4 p.o. nightly. Continue hydroxyzine 25 mg, 1 p.o. twice daily as needed. Continue Zyprexa 20 mg, 1 p.o. nightly. Continue prazosin 2 mg, 1 p.o. nightly. Continue trazodone 100 mg, 1.5 pills nightly. Return in 6 months.  Donnal Moat, PA-C

## 2021-02-12 ENCOUNTER — Emergency Department (HOSPITAL_BASED_OUTPATIENT_CLINIC_OR_DEPARTMENT_OTHER): Payer: Medicare Other

## 2021-02-12 ENCOUNTER — Other Ambulatory Visit: Payer: Self-pay

## 2021-02-12 ENCOUNTER — Emergency Department (HOSPITAL_BASED_OUTPATIENT_CLINIC_OR_DEPARTMENT_OTHER)
Admission: EM | Admit: 2021-02-12 | Discharge: 2021-02-13 | Disposition: A | Discharge status: Home / Self Care | Payer: Medicare Other | Attending: Emergency Medicine | Admitting: Emergency Medicine

## 2021-02-12 ENCOUNTER — Encounter (HOSPITAL_BASED_OUTPATIENT_CLINIC_OR_DEPARTMENT_OTHER): Payer: Self-pay | Admitting: *Deleted

## 2021-02-12 DIAGNOSIS — E039 Hypothyroidism, unspecified: Secondary | ICD-10-CM | POA: Insufficient documentation

## 2021-02-12 DIAGNOSIS — Z79899 Other long term (current) drug therapy: Secondary | ICD-10-CM | POA: Insufficient documentation

## 2021-02-12 DIAGNOSIS — Z955 Presence of coronary angioplasty implant and graft: Secondary | ICD-10-CM | POA: Diagnosis not present

## 2021-02-12 DIAGNOSIS — R1032 Left lower quadrant pain: Secondary | ICD-10-CM

## 2021-02-12 DIAGNOSIS — R109 Unspecified abdominal pain: Secondary | ICD-10-CM | POA: Diagnosis not present

## 2021-02-12 LAB — URINALYSIS, ROUTINE W REFLEX MICROSCOPIC
Bilirubin Urine: NEGATIVE
Glucose, UA: NEGATIVE mg/dL
Hgb urine dipstick: NEGATIVE
Ketones, ur: NEGATIVE mg/dL
Nitrite: NEGATIVE
Protein, ur: NEGATIVE mg/dL
Specific Gravity, Urine: 1.01 (ref 1.005–1.030)
pH: 6.5 (ref 5.0–8.0)

## 2021-02-12 LAB — URINALYSIS, MICROSCOPIC (REFLEX)

## 2021-02-12 MED ORDER — KETOROLAC TROMETHAMINE 60 MG/2ML IM SOLN
60.0000 mg | Freq: Once | INTRAMUSCULAR | Status: AC
  Administered 2021-02-13: 60 mg via INTRAMUSCULAR
  Filled 2021-02-12: qty 2

## 2021-02-12 NOTE — ED Notes (Signed)
ED Provider at bedside. 

## 2021-02-12 NOTE — ED Triage Notes (Signed)
Pt. Reports she started having pain last Monday on the L side.  Pt. Reports she was seen at [facility] and Dx with kidney stone 69mm .  She was given meds and also Dx with UTI as well.  Pt. 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  Pt. Was unable to take meds prescribed to her from the ED.  The Pt. Had to go the her PCP due to this and get meds.  Pt. Now having severe back pain and flank pain.  Pt. Is able to urinate but the back pain is severe.

## 2021-02-12 NOTE — ED Notes (Signed)
Pt is in CT

## 2021-02-13 DIAGNOSIS — R109 Unspecified abdominal pain: Secondary | ICD-10-CM | POA: Diagnosis not present

## 2021-02-13 MED ORDER — PHENAZOPYRIDINE HCL 200 MG PO TABS: 200.0000 mg | ORAL_TABLET | Freq: Three times a day (TID) | ORAL | 0 refills | Status: DC | PRN

## 2021-02-13 MED ORDER — FOSFOMYCIN TROMETHAMINE 3 G PO PACK
3.0000 g | PACK | Freq: Once | ORAL | Status: AC
  Administered 2021-02-13: 3 g via ORAL
  Filled 2021-02-13: qty 3

## 2021-02-13 NOTE — ED Provider Notes (Addendum)
[facility] Provider Note   CSN: [id number] Arrival date & time: 02/12/21  2108     History Chief Complaint  Patient presents with  . Flank Pain    Morgan [name] is a 50 y.o. female.  The history is provided by the patient.  Groin Pain This is a recurrent problem. The current episode started 6 to 12 hours ago. The problem occurs constantly. The problem has not changed since onset.Pertinent negatives include no chest pain, no headaches and no shortness of breath. Nothing aggravates the symptoms. Nothing relieves the symptoms. Treatments tried: ultram. The treatment provided no relief.  States Morgan [name] was diagnosed with kidney stone and UTI and the pain has recurred.  No f/c/r.  No n/v/d.       Past Medical History:  Diagnosis Date  . Acid reflux   . Family history of adverse reaction to anesthesia    sister has PONV  . H. pylori infection    hx of , none now  . Headache    migraines, uses imitrex as needed  . Hematuria   . History of kidney stones   . Hypothyroidism   . PONV (postoperative nausea and vomiting)   . Renal calculi BILATERAL  . Right ureteral stone   . RLS (restless legs syndrome)   . [place] brain [place] 04/12/2015    Patient Active Problem List   Diagnosis Date Noted  . Chronic pain 07/27/2019  . Head trauma 01/29/2018  . Major depressive disorder, recurrent, severe without psychotic behavior ([employer]) 03/13/2016  . PTSD (post-traumatic stress disorder) 03/13/2016  . Chronic post-traumatic stress disorder (PTSD) 03/13/2016  . Depression   . Suicidal ideation   . Ureteral calculus 12/26/2014  . Ureteral calculus, right 03/31/2013    Past Surgical History:  Procedure Laterality Date  . ABDOMINAL HYSTERECTOMY    . CHOLECYSTECTOMY    . CYSTOSCOPY WITH RETROGRADE PYELOGRAM, URETEROSCOPY AND STENT PLACEMENT Left 12/26/2014   Procedure: CYSTOSCOPY WITH RETROGRADE PYELOGRAM, URETEROSCOPY AND STENT PLACEMENT;   Surgeon: Bernestine Amass, MD;  Location: [facility];  Service: Urology;  Laterality: Left;  . CYSTOSCOPY/RETROGRADE/URETEROSCOPY Right 03/31/2013   Procedure: CYSTOSCOPY/RETROGRADE/URETEROSCOPY;  Surgeon: Bernestine Amass, MD;  Location: [facility];  Service: Urology;  Laterality: Right;  . 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  . HOLMIUM LASER APPLICATION Right 5/39/7673   Procedure: HOLMIUM LASER APPLICATION;  Surgeon: Bernestine Amass, MD;  Location: [facility];  Service: Urology;  Laterality: Right;  . HOLMIUM LASER APPLICATION Left 02/16/9378   Procedure: HOLMIUM LASER APPLICATION;  Surgeon: Bernestine Amass, MD;  Location: [facility];  Service: Urology;  Laterality: Left;  . LAPAROSCOPIC ASSISTED VAGINAL HYSTERECTOMY  MARCH 2014   W/ BILATERAL SALPINGOOPHORECTOMY  . LAPAROSCOPIC GASTRIC BANDING  SEPT 2011  . TONSILLECTOMY AND ADENOIDECTOMY  AGE 89'S  . URETEROLITHOTOMY  NOV 2013     OB History   No obstetric history on file.     Family History  Problem Relation Age of Onset  . Diabetes Mother   . Hypertension Mother   . Cancer Mother   . Hypertension Father   . COPD Father   . Deafness Father   . Emphysema Father     Social History   Tobacco Use  . Smoking status: Never Smoker  . Smokeless tobacco: Never Used  Substance Use Topics  . Alcohol use: No  . Drug use: No    Home Medications Prior to Admission  medications   Medication Sig Start Date End Date Taking? Authorizing Provider  AIMOVIG 70 MG/ML SOAJ Inject 70 mg into the muscle every 30 (thirty) days.  10/13/17  Yes [provider]  AMITIZA 24 MCG capsule Take 24 mcg by mouth 2 (two) times daily with a meal.  09/24/17  Yes [provider]  buPROPion (WELLBUTRIN XL) 150 MG 24 hr tablet Take 1 tablet (150 mg total) by mouth daily. 01/23/21  Yes Hurst, Dorothea Glassman, PA-C  busPIRone (BUSPAR) 30 MG tablet Take 1 tablet (30 mg total) by mouth 2 (two) times daily.  01/23/21  Yes Donnal Moat T, PA-C  cyclobenzaprine (FLEXERIL) 10 MG tablet Take 1 tablet (10 mg total) by mouth 3 (three) times daily as needed for muscle spasms. 11/04/18  Yes Shugart, Lissa Hoard, PA-C  diazepam (VALIUM) 5 MG tablet Take 1 tablet (5 mg total) by mouth every 12 (twelve) hours as needed for anxiety. 01/23/21  Yes Hurst, Teresa T, PA-C  fluticasone (FLONASE) 50 MCG/ACT nasal spray Place 1 spray into both nostrils daily as needed for allergies or rhinitis.   Yes [provider]  fluvoxaMINE (LUVOX) 100 MG tablet Take 2 tablets (200 mg total) by mouth at bedtime. 01/23/21  Yes Hurst, Helene Kelp T, PA-C  gabapentin (NEURONTIN) 400 MG capsule TAKE 2 CAPSULES BY MOUTH IN THE MORNING , 2 CAPSULES AT LUNCH, AND 4 CAPSULES EVERY NIGHT AT BEDTIME 01/23/21  Yes Hurst, Teresa T, PA-C  hydrOXYzine (ATARAX/VISTARIL) 25 MG tablet Take 1 tablet (25 mg total) by mouth 2 (two) times daily. 07/27/19  Yes Mozingo, Berdie Ogren, NP  ipratropium-albuterol (DUONEB) 0.5-2.5 (3) MG/3ML SOLN Inhale 3 mLs into the lungs every 6 (six) hours as needed (SOB, wheezing).    Yes [provider]  ketorolac (TORADOL) 10 MG tablet TK 1 T PO  Q 6 H PRF P 10/16/17  Yes [provider]  levothyroxine (SYNTHROID) 88 MCG tablet Take 112 mcg by mouth daily before breakfast.    Yes [provider]  linaclotide (LINZESS) 290 MCG CAPS capsule Take 290 mcg by mouth daily before breakfast.   Yes [provider]  LYRICA 100 MG capsule  10/23/17  Yes [provider]  montelukast (SINGULAIR) 10 MG tablet Take 10 mg by mouth at bedtime.  02/05/16  Yes [provider]  OLANZapine (ZYPREXA) 20 MG tablet Take 1 tablet (20 mg total) by mouth at bedtime. 01/23/21  Yes Hurst, Helene Kelp T, PA-C  ondansetron (ZOFRAN-ODT) 4 MG disintegrating tablet Take 4 mg by mouth every 8 (eight) hours as needed for nausea.  02/05/16  Yes [provider]  prazosin (MINIPRESS) 2 MG capsule TAKE 1 CAPSULE BY  MOUTH AT  BEDTIME 12/29/20  Yes Hurst, Teresa T, PA-C  topiramate (TOPAMAX) 100 MG tablet Take 1.5 tablets (150 mg total) by mouth 2 (two) times daily. 12/20/20  Yes Donnal Moat T, PA-C  topiramate (TOPAMAX) 100 MG tablet Take 1.5 tablets (150 mg total) by mouth 2 (two) times daily. 12/20/20  Yes Donnal Moat T, PA-C  traMADol (ULTRAM) 50 MG tablet Take 50 mg by mouth every 6 (six) hours as needed for moderate pain (kidney stones).   Yes [provider]  traZODone (DESYREL) 100 MG tablet Take 1.5 tablets (150 mg total) by mouth at bedtime as needed for sleep. 01/23/21  Yes Donnal Moat T, PA-C  zonisamide (ZONEGRAN) 50 MG capsule Take by mouth. 02/18/20 02/17/21 Yes [provider]  levothyroxine (SYNTHROID, LEVOTHROID) 100 MCG tablet Take 112 mcg  by mouth daily before breakfast.  09/09/17   [provider]  montelukast (SINGULAIR) 10 MG tablet TAKE 1 TABLET BY MOUTH NIGHTLY 02/05/16   [provider]  Multiple Vitamin (MULTI VITAMIN) TABS Take 1 tablet by mouth daily. 09/23/19   Addison Lank, PA-C  omeprazole (PRILOSEC) 40 MG capsule Take 40 mg by mouth daily.  07/29/17   [provider]  OnabotulinumtoxinA (BOTOX IJ) Inject as directed every 3 (three) months.    [provider]  predniSONE (DELTASONE) 10 MG tablet Take 10 mg by mouth daily with breakfast.  Patient not taking: Reported on 01/23/2021 10/23/17   [provider]  promethazine (PHENERGAN) 12.5 MG tablet Take 12.5 mg by mouth every 6 (six) hours as needed for nausea or vomiting.  10/29/17   [provider]    Allergies    Clindamycin/lincomycin, Nsaids, Oxycodone, and Percocet [oxycodone-acetaminophen]  Review of Systems   Review of Systems  Constitutional: Negative for fever.  HENT: Negative for congestion.   Eyes: Negative for visual disturbance.  Respiratory: Negative for shortness of breath.   Cardiovascular: Negative for chest pain.  Gastrointestinal: Negative  for nausea.  Genitourinary: Negative for dysuria and urgency.  Musculoskeletal: Negative for arthralgias.  Neurological: Negative for headaches.  Psychiatric/Behavioral: Negative for agitation.  All other systems reviewed and are negative.   Physical Exam Updated Vital Signs BP 118/85 (BP Location: Right Arm)   Pulse 78   Temp 98.2 F (36.8 C) (Oral)   Resp 15   Ht 5\' 3"  (1.6 m)   Wt 55.8 kg   SpO2 100%   BMI 21.79 kg/m   Physical Exam Vitals and nursing note reviewed.  Constitutional:      General: Morgan [name] is not in acute distress.    Appearance: Normal appearance.  HENT:     Head: Normocephalic and atraumatic.     Nose: Nose normal.  Eyes:     Conjunctiva/sclera: Conjunctivae normal.     Pupils: Pupils are equal, round, and reactive to light.  Cardiovascular:     Rate and Rhythm: Normal rate and regular rhythm.     Pulses: Normal pulses.     Heart sounds: Normal heart sounds.  Pulmonary:     Effort: Pulmonary effort is normal.     Breath sounds: Normal breath sounds.  Abdominal:     General: Abdomen is flat. Bowel sounds are normal.     Palpations: Abdomen is soft.     Tenderness: There is no abdominal tenderness. There is no guarding.  Musculoskeletal:        General: Normal range of motion.     Cervical back: Normal range of motion and neck supple.  Skin:    General: Skin is warm and dry.     Capillary Refill: Capillary refill takes less than 2 seconds.  Neurological:     General: No focal deficit present.     Mental Status: Morgan [name] is alert.     Deep Tendon Reflexes: Reflexes normal.  Psychiatric:        Thought Content: Thought content normal.     ED Results / Procedures / Treatments   Labs (all labs ordered are listed, but only abnormal results are displayed) Results for orders placed or performed during the hospital encounter of 02/12/21  Urinalysis, Routine w reflex microscopic Urine, Clean Catch  Result Value Ref Range   Color, Urine YELLOW YELLOW    APPearance CLEAR CLEAR   Specific Gravity, Urine 1.010 1.005 - 1.030  pH 6.5 5.0 - 8.0   Glucose, UA NEGATIVE NEGATIVE mg/dL   Hgb urine dipstick NEGATIVE NEGATIVE   Bilirubin Urine NEGATIVE NEGATIVE   Ketones, ur NEGATIVE NEGATIVE mg/dL   Protein, ur NEGATIVE NEGATIVE mg/dL   Nitrite NEGATIVE NEGATIVE   Leukocytes,Ua TRACE (A) NEGATIVE  Urinalysis, Microscopic (reflex)  Result Value Ref Range   RBC / HPF 0-5 0 - 5 RBC/hpf   WBC, UA 0-5 0 - 5 WBC/hpf   Bacteria, UA RARE (A) NONE SEEN   Squamous Epithelial / LPF 0-5 0 - 5   Mucus PRESENT    Ca Oxalate Crys, UA PRESENT    CT Renal Stone Study  Result Date: 02/12/2021 CLINICAL DATA:  Left-sided flank pain EXAM: CT ABDOMEN AND PELVIS WITHOUT CONTRAST TECHNIQUE: Multidetector CT imaging of the abdomen and pelvis was performed following the standard protocol without IV contrast. COMPARISON:  02/05/2021 FINDINGS: Lower chest: No acute abnormality. Breast implants are noted bilaterally. Hepatobiliary: No focal liver abnormality is seen. Status post cholecystectomy. No biliary dilatation. Pancreas: Unremarkable. No pancreatic ductal dilatation or surrounding inflammatory changes. Spleen: Normal in size without focal abnormality. Adrenals/Urinary Tract: Adrenal glands are within normal limits. Kidneys are well visualized bilaterally. Previously seen nonobstructing left renal stone is again identified and stable. Tiny stone is noted in the lower pole of the right kidney. No obstructive changes are noted. Bladder is decompressed. Stomach/Bowel: Postsurgical changes are noted in the small bowel. No obstructive or inflammatory changes of the colon are seen. The appendix is well visualized and within normal limits. Stomach shows postsurgical changes as well. Vascular/Lymphatic: No significant vascular findings are present. No enlarged abdominal or pelvic lymph nodes. Reproductive: Status post hysterectomy. No adnexal masses. Other: No abdominal wall hernia or  abnormality. No abdominopelvic ascites. Musculoskeletal: No acute or significant osseous findings. IMPRESSION: Nonobstructing renal calculi similar to that seen on the prior exam. No obstructive changes are noted in the kidneys. 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 Electronically Signed   By: Inez Catalina M.D.   On: 02/12/2021 23:50    EKG None  Radiology CT Renal Stone Study  Result Date: 02/12/2021 CLINICAL DATA:  Left-sided flank pain EXAM: CT ABDOMEN AND PELVIS WITHOUT CONTRAST TECHNIQUE: Multidetector CT imaging of the abdomen and pelvis was performed following the standard protocol without IV contrast. COMPARISON:  02/05/2021 FINDINGS: Lower chest: No acute abnormality. Breast implants are noted bilaterally. Hepatobiliary: No focal liver abnormality is seen. Status post cholecystectomy. No biliary dilatation. Pancreas: Unremarkable. No pancreatic ductal dilatation or surrounding inflammatory changes. Spleen: Normal in size without focal abnormality. Adrenals/Urinary Tract: Adrenal glands are within normal limits. Kidneys are well visualized bilaterally. Previously seen nonobstructing left renal stone is again identified and stable. Tiny stone is noted in the lower pole of the right kidney. No obstructive changes are noted. Bladder is decompressed. Stomach/Bowel: Postsurgical changes are noted in the small bowel. No obstructive or inflammatory changes of the colon are seen. The appendix is well visualized and within normal limits. Stomach shows postsurgical changes as well. Vascular/Lymphatic: No significant vascular findings are present. No enlarged abdominal or pelvic lymph nodes. Reproductive: Status post hysterectomy. No adnexal masses. Other: No abdominal wall hernia or abnormality. No abdominopelvic ascites. Musculoskeletal: No acute or significant osseous findings. IMPRESSION: Nonobstructing renal calculi similar to that seen on the prior exam. No obstructive changes are  noted in the kidneys. 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 Electronically Signed   By: Inez Catalina M.D.   On: 02/12/2021 23:50  Procedures Procedures   Medications Ordered in ED Medications  fosfomycin (MONUROL) packet 3 g (has no administration in time range)  ketorolac (TORADOL) injection 60 mg (60 mg Intramuscular Given 02/13/21 0007)    ED Course  I have reviewed the triage vital signs and the nursing notes.  Pertinent labs & imaging results that were available during my care of the patient were reviewed by me and considered in my medical decision making (see chart for details).   Benson with score of 498, multiple narcotic scripts from multiple individuals.    There are no stones and there weren't last week either.  I suspect drug seeking behavior.  RX for pyridium given.   MDM Rules/Calculators/A&P                          Anacristina Steffek Kruczek was evaluated in Emergency Department on 02/13/2021 for the symptoms described in the history of present illness. Morgan [name] was evaluated in the context of the global COVID-19 pandemic, which necessitated consideration that the patient might be at risk for infection with the SARS-CoV-2 virus that causes COVID-19. Institutional protocols and algorithms that pertain to the evaluation of patients at risk for COVID-19 are in a state of rapid change based on information released by regulatory bodies including the CDC and federal and state organizations. These policies and algorithms were followed during the patient's care in the ED.  Final Clinical Impression(s) / ED Diagnoses Final diagnoses:  None    Return for intractable cough, coughing up blood, fevers >100.4 unrelieved by medication, shortness of breath, intractable vomiting, chest pain, shortness of breath, weakness, numbness, changes in speech, facial asymmetry, abdominal pain, passing out, Inability to tolerate liquids or food, cough, altered mental status or any  concerns. No signs of systemic illness or infection. The patient is nontoxic-appearing on exam and vital signs are within normal limits.  I have reviewed the triage vital signs and the nursing notes. Pertinent labs & imaging results that were available during my care of the patient were reviewed by me and considered in my medical decision making (see chart for details). After history, exam, and medical workup I feel the patient has been appropriately medically screened and is safe for discharge home. Pertinent diagnoses were discussed with the patient. Patient was given return precautions.   Korben Carcione, MD 02/13/21 [place], Khamryn Calderone, MD 02/13/21 [id number]

## 2021-06-24 ENCOUNTER — Encounter (HOSPITAL_BASED_OUTPATIENT_CLINIC_OR_DEPARTMENT_OTHER): Payer: Self-pay | Admitting: Emergency Medicine

## 2021-06-24 ENCOUNTER — Other Ambulatory Visit: Payer: Self-pay

## 2021-06-24 ENCOUNTER — Emergency Department (HOSPITAL_BASED_OUTPATIENT_CLINIC_OR_DEPARTMENT_OTHER)
Admission: EM | Admit: 2021-06-24 | Discharge: 2021-06-24 | Disposition: A | Discharge status: Home / Self Care | Payer: Medicare Other | Attending: Emergency Medicine | Admitting: Emergency Medicine

## 2021-06-24 DIAGNOSIS — G43909 Migraine, unspecified, not intractable, without status migrainosus: Secondary | ICD-10-CM

## 2021-06-24 DIAGNOSIS — Z79899 Other long term (current) drug therapy: Secondary | ICD-10-CM | POA: Diagnosis not present

## 2021-06-24 DIAGNOSIS — H53149 Visual discomfort, unspecified: Secondary | ICD-10-CM | POA: Diagnosis not present

## 2021-06-24 DIAGNOSIS — M542 Cervicalgia: Secondary | ICD-10-CM | POA: Insufficient documentation

## 2021-06-24 DIAGNOSIS — R519 Headache, unspecified: Secondary | ICD-10-CM | POA: Diagnosis present

## 2021-06-24 DIAGNOSIS — E039 Hypothyroidism, unspecified: Secondary | ICD-10-CM | POA: Insufficient documentation

## 2021-06-24 MED ORDER — DEXAMETHASONE SODIUM PHOSPHATE 10 MG/ML IJ SOLN
10.0000 mg | Freq: Once | INTRAMUSCULAR | Status: AC
  Administered 2021-06-24: 10 mg via INTRAVENOUS
  Filled 2021-06-24: qty 1

## 2021-06-24 MED ORDER — SODIUM CHLORIDE 0.9 % IV BOLUS
1000.0000 mL | Freq: Once | INTRAVENOUS | Status: AC
  Administered 2021-06-24: 1000 mL via INTRAVENOUS

## 2021-06-24 MED ORDER — METOCLOPRAMIDE HCL 5 MG/ML IJ SOLN
10.0000 mg | Freq: Once | INTRAMUSCULAR | Status: AC
  Administered 2021-06-24: 10 mg via INTRAVENOUS
  Filled 2021-06-24: qty 2

## 2021-06-24 MED ORDER — DIPHENHYDRAMINE HCL 50 MG/ML IJ SOLN
25.0000 mg | Freq: Once | INTRAMUSCULAR | Status: AC
  Administered 2021-06-24: 25 mg via INTRAVENOUS
  Filled 2021-06-24: qty 1

## 2021-06-24 MED ORDER — PROCHLORPERAZINE EDISYLATE 10 MG/2ML IJ SOLN
10.0000 mg | Freq: Once | INTRAMUSCULAR | Status: AC
  Administered 2021-06-24: 10 mg via INTRAVENOUS
  Filled 2021-06-24: qty 2

## 2021-06-24 NOTE — ED Triage Notes (Signed)
States h/a's for the past month, worse over past 24hrs. Last used Imitrex during the night, without relief.

## 2021-06-24 NOTE — Discharge Instructions (Addendum)
Follow-up with your neurologist if your headaches continue.  Return here as needed if you have any worsening symptoms or headaches that are different than your typical migraines.

## 2021-06-24 NOTE — ED Provider Notes (Signed)
[facility] EMERGENCY DEPARTMENT Provider Note   CSN: [id number] Arrival date & time: 06/24/21  [id number]     History Chief Complaint  Patient presents with   Migraine    Morgan [name] is a 50 y.o. female.  Patient is a 50 year old female who presents with a migraine.  She has a longstanding history of migraines.  She is followed by neurology at [facility].  She is getting Botox injections.  She says she has had a chronic headache for the last month but it got worse yesterday.  She said through the night it got worse and she started vomiting during the night.  She has pain in the front of her head going to the back of her head and down her neck.  She says this is typical for her migraines.  She typically does have neck pain associated with her migraines.  There is no stiffness of her neck.  No fevers.  She does have some photophobia.  She tried her Imitrex tablets and injections without improvement in symptoms.      Past Medical History:  Diagnosis Date   Acid reflux    Family history of adverse reaction to anesthesia    sister has PONV   H. 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   PTSD (post-traumatic stress disorder) 03/13/2016   Chronic post-traumatic stress disorder (PTSD) 03/13/2016   Depression    Suicidal ideation    Ureteral calculus 12/26/2014   Ureteral calculus, right 03/31/2013    Past Surgical History:  Procedure Laterality Date   ABDOMINAL HYSTERECTOMY     CHOLECYSTECTOMY      CYSTOSCOPY WITH RETROGRADE PYELOGRAM, URETEROSCOPY AND STENT PLACEMENT Left 12/26/2014   Procedure: CYSTOSCOPY WITH RETROGRADE PYELOGRAM, URETEROSCOPY AND STENT PLACEMENT;  Surgeon: Bernestine Amass, MD;  Location: [facility];  Service: Urology;  Laterality: Left;   CYSTOSCOPY/RETROGRADE/URETEROSCOPY Right 03/31/2013   Procedure: CYSTOSCOPY/RETROGRADE/URETEROSCOPY;  Surgeon: Bernestine Amass, MD;  Location: [facility];  Service: Urology;  Laterality: Right;   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   HOLMIUM LASER APPLICATION Right [id number]   Procedure: HOLMIUM LASER APPLICATION;  Surgeon: Bernestine Amass, MD;  Location: [facility];  Service: Urology;  Laterality: Right;   HOLMIUM LASER APPLICATION Left [id number]   Procedure: HOLMIUM LASER APPLICATION;  Surgeon: Bernestine Amass, MD;  Location: [facility];  Service: Urology;  Laterality: Left;   LAPAROSCOPIC ASSISTED VAGINAL HYSTERECTOMY  MARCH 2014   W/ BILATERAL SALPINGOOPHORECTOMY   LAPAROSCOPIC GASTRIC BANDING  SEPT 2011   TONSILLECTOMY AND ADENOIDECTOMY  AGE 22'S   URETEROLITHOTOMY  NOV 2013     OB History   No obstetric history on file.     Family History  Problem Relation Age of Onset   Diabetes Mother    Hypertension Mother    Cancer Mother    Hypertension Father    COPD Father    Deafness  Father    Emphysema Father     Social History   Tobacco Use   Smoking status: Never   Smokeless tobacco: Never  Substance Use Topics   Alcohol use: No   Drug use: No    Home Medications Prior to Admission medications   Medication Sig Start Date End Date Taking? Authorizing Provider  AIMOVIG 70 MG/ML SOAJ Inject 70 mg into the muscle every 30 (thirty) days.  10/13/17   [provider]  AMITIZA 24 MCG capsule Take 24 mcg by mouth 2 (two) times daily with a meal.  09/24/17   [provider]  buPROPion (WELLBUTRIN XL) 150 MG 24 hr tablet Take 1 tablet (150 mg total) by mouth  daily. 01/23/21   Donnal Moat T, PA-C  busPIRone (BUSPAR) 30 MG tablet Take 1 tablet (30 mg total) by mouth 2 (two) times daily. 01/23/21   Donnal Moat T, PA-C  cyclobenzaprine (FLEXERIL) 10 MG tablet Take 1 tablet (10 mg total) by mouth 3 (three) times daily as needed for muscle spasms. 11/04/18   Shugart, Lissa Hoard, PA-C  diazepam (VALIUM) 5 MG tablet Take 1 tablet (5 mg total) by mouth every 12 (twelve) hours as needed for anxiety. 01/23/21   Donnal Moat T, PA-C  fluticasone (FLONASE) 50 MCG/ACT nasal spray Place 1 spray into both nostrils daily as needed for allergies or rhinitis.    [provider]  fluvoxaMINE (LUVOX) 100 MG tablet Take 2 tablets (200 mg total) by mouth at bedtime. 01/23/21   Donnal Moat T, PA-C  gabapentin (NEURONTIN) 400 MG capsule TAKE 2 CAPSULES BY MOUTH IN THE MORNING , 2 CAPSULES AT LUNCH, AND 4 CAPSULES EVERY NIGHT AT BEDTIME 01/23/21   Donnal Moat T, PA-C  hydrOXYzine (ATARAX/VISTARIL) 25 MG tablet Take 1 tablet (25 mg total) by mouth 2 (two) times daily. 07/27/19   Mozingo, Berdie Ogren, NP  ipratropium-albuterol (DUONEB) 0.5-2.5 (3) MG/3ML SOLN Inhale 3 mLs into the lungs every 6 (six) hours as needed (SOB, wheezing).     [provider]  ketorolac (TORADOL) 10 MG tablet TK 1 T PO  Q 6 H PRF P 10/16/17   [provider]  levothyroxine (SYNTHROID) 88 MCG tablet Take 112 mcg by mouth daily before breakfast.     [provider]  levothyroxine (SYNTHROID, LEVOTHROID) 100 MCG tablet Take 112 mcg by mouth daily before breakfast.  09/09/17   [provider]  linaclotide (LINZESS) 290 MCG CAPS capsule Take 290 mcg by mouth daily before breakfast.    [provider]  LYRICA 100 MG capsule  10/23/17   [provider]  montelukast (SINGULAIR) 10 MG tablet Take 10 mg by mouth at bedtime.  02/05/16   [provider]  montelukast (SINGULAIR) 10 MG tablet TAKE 1 TABLET BY MOUTH NIGHTLY 02/05/16   [provider]  Multiple Vitamin (MULTI VITAMIN) TABS Take 1 tablet by mouth daily. 09/23/19   Donnal Moat T, PA-C  OLANZapine (ZYPREXA) 20 MG tablet Take 1 tablet (20 mg total) by mouth at bedtime. 01/23/21   Addison Lank, PA-C  omeprazole (PRILOSEC) 40 MG capsule Take 40 mg by mouth daily.  07/29/17   [provider]  OnabotulinumtoxinA (BOTOX IJ) Inject as directed every 3 (three) months.    [provider]  ondansetron (ZOFRAN-ODT) 4 MG disintegrating tablet Take 4 mg by mouth every 8 (eight) hours as needed for nausea.  02/05/16   [provider]  phenazopyridine (PYRIDIUM) 200 MG tablet Take 1  tablet (200 mg total) by mouth 3 (three) times daily as needed for pain. 02/13/21   Palumbo, April, MD  prazosin (MINIPRESS) 2 MG capsule TAKE 1 CAPSULE BY MOUTH AT  BEDTIME 12/29/20   Hurst, Teresa T, PA-C  predniSONE (DELTASONE) 10 MG tablet Take 10 mg by mouth daily with breakfast.  Patient not taking: Reported on 01/23/2021 10/23/17   [provider]  promethazine (PHENERGAN) 12.5 MG tablet Take 12.5 mg by mouth every 6 (six) hours as needed for nausea or vomiting.  10/29/17   [provider]  topiramate (TOPAMAX) 100 MG tablet Take 1.5 tablets (150 mg total) by mouth 2 (two) times daily. 12/20/20   Donnal Moat T, PA-C  topiramate (TOPAMAX) 100 MG tablet Take 1.5 tablets (150 mg total) by mouth 2 (two) times daily. 12/20/20   Donnal Moat T, PA-C  traMADol (ULTRAM) 50 MG tablet Take 50 mg by mouth every 6 (six) hours as needed for moderate pain (kidney stones).    [provider]  traZODone (DESYREL) 100 MG tablet Take 1.5 tablets (150 mg total) by mouth at bedtime as needed for sleep. 01/23/21   Donnal Moat T, PA-C  zonisamide (ZONEGRAN) 50 MG capsule Take by mouth. 02/18/20 02/17/21  [provider]    Allergies    Clindamycin/lincomycin, Nsaids, Oxycodone, and Percocet [oxycodone-acetaminophen]  Review of Systems   Review of Systems  Constitutional:   Negative for chills, diaphoresis, fatigue and fever.  HENT:  Negative for congestion, rhinorrhea and sneezing.   Eyes:  Positive for photophobia.  Respiratory:  Negative for cough, chest tightness and shortness of breath.   Cardiovascular:  Negative for chest pain and leg swelling.  Gastrointestinal:  Positive for nausea and vomiting. Negative for abdominal pain, blood in stool and diarrhea.  Genitourinary:  Negative for difficulty urinating, flank pain, frequency and hematuria.  Musculoskeletal:  Negative for arthralgias and back pain.  Skin:  Negative for rash.  Neurological:  Positive for headaches. Negative for dizziness, speech difficulty, weakness and numbness.   Physical Exam Updated Vital Signs BP (!) 90/51 (BP Location: Right Arm)   Pulse 90   Temp 97.9 F (36.6 C) (Oral)   Resp 14   Ht '5\' 3"'$  (1.6 m)   Wt 54.9 kg   SpO2 96%   BMI 21.45 kg/m   Physical Exam Constitutional:      Appearance: She is well-developed.  HENT:     Head: Normocephalic and atraumatic.  Eyes:     Pupils: Pupils are equal, round, and reactive to light.  Neck:     Comments: No meningismus Cardiovascular:     Rate and Rhythm: Normal rate and regular rhythm.     Heart sounds: Normal heart sounds.  Pulmonary:     Effort: Pulmonary effort is normal. No respiratory distress.     Breath sounds: Normal breath sounds. No wheezing or rales.  Chest:     Chest wall: No tenderness.  Abdominal:     General: Bowel sounds are normal.     Palpations: Abdomen is soft.     Tenderness: There is no abdominal tenderness. There is no guarding or rebound.  Musculoskeletal:        General: Normal range of motion.     Cervical back: Normal range of motion and neck supple.  Lymphadenopathy:     Cervical: No cervical adenopathy.  Skin:    General: Skin is warm and dry.     Findings: No rash.  Neurological:  Mental Status: She is alert and oriented to person, place, and time.     Comments: Motor 5/5 all  extremities Sensation grossly intact to LT all extremities Finger to Nose intact, no pronator drift CN II-XII grossly intact      ED Results / Procedures / Treatments   Labs (all labs ordered are listed, but only abnormal results are displayed) Labs Reviewed - No data to display  EKG None  Radiology No results found.  Procedures Procedures   Medications Ordered in ED Medications  sodium chloride 0.9 % bolus 1,000 mL (0 mLs Intravenous Stopped 06/24/21 1040)  dexamethasone (DECADRON) injection 10 mg (10 mg Intravenous Given 06/24/21 0931)  diphenhydrAMINE (BENADRYL) injection 25 mg (25 mg Intravenous Given 06/24/21 0931)  metoCLOPramide (REGLAN) injection 10 mg (10 mg Intravenous Given 06/24/21 0931)  prochlorperazine (COMPAZINE) injection 10 mg (10 mg Intravenous Given 06/24/21 1052)    ED Course  I have reviewed the triage vital signs and the nursing notes.  Pertinent labs & imaging results that were available during my care of the patient were reviewed by me and considered in my medical decision making (see chart for details).    MDM Rules/Calculators/A&P                           Patient is a 50 year old female who presents with a migraine.  She states its typical for her normal migraine.  She does have some associated neck pain which she also says is typical for her migraines.  She does not report any unusual characteristics.  She has no meningismus.  No fever.  No other symptoms that sound more concerning for subarachnoid hemorrhage or meningitis.  Her symptoms have markedly improved after treatment in the ED with migraine cocktail.  She was discharged home in good condition.  She was encouraged to follow-up with her neurologist if her symptoms are not improving or return here as needed for any worsening symptoms including worsening headaches or any atypical type headache.  Her blood pressure is on the low side.  She does report that her normal blood pressures in the 90s over  60s. Final Clinical Impression(s) / ED Diagnoses Final diagnoses:  Migraine without status migrainosus, not intractable, unspecified migraine type    Rx / DC Orders ED Discharge Orders     None        Malvin Johns, MD 06/24/21 1222

## 2021-06-28 ENCOUNTER — Other Ambulatory Visit: Payer: Self-pay | Admitting: Physician Assistant

## 2021-07-03 ENCOUNTER — Other Ambulatory Visit: Payer: Self-pay | Admitting: Physician Assistant

## 2021-07-24 ENCOUNTER — Other Ambulatory Visit: Payer: Self-pay | Admitting: Physician Assistant

## 2021-07-24 NOTE — Telephone Encounter (Signed)
Pt mis completely out of her medicine

## 2021-07-26 ENCOUNTER — Telehealth: Payer: Self-pay

## 2021-07-26 NOTE — Telephone Encounter (Signed)
Please review refill request for Topamax, did not see listed in last office note on 01/23/21

## 2021-07-26 NOTE — Telephone Encounter (Signed)
I thought she was off it, but it's ok to RF till I can disc w/ her. The neuro mentioned it in his last note.

## 2021-07-26 NOTE — Telephone Encounter (Signed)
Received a fax from [facility] I contacted them for clarification. Donnal Moat, PA-C,prescribes Luvox 100 mg for pt, but [facility] are reporting they just filled and sent a Rx written by another provider (I believe Dr. Clint Bolder?) for Sertraline 100 mg.   [facility] is asking if pt should continue both of these medications?

## 2021-07-27 ENCOUNTER — Other Ambulatory Visit: Payer: Self-pay | Admitting: Physician Assistant

## 2021-07-27 NOTE — Telephone Encounter (Signed)
Noted  

## 2021-07-27 NOTE — Telephone Encounter (Signed)
Pt called back and seemed to not know anything about the Sertraline. I advised her to check her medication bottles and to make sure she is just taking Luvox. She agreed and will discuss more on Tuesday.

## 2021-07-27 NOTE — Telephone Encounter (Signed)
She shouldn't be on both. Please call the patient to see why she was given that and see which one she has been taking.  Stop the Zoloft and continue Luvox.  If she has any questions we can discuss at her appointment next week unless it is urgent. Thank you

## 2021-07-27 NOTE — Telephone Encounter (Signed)
Left a message for her to call back. She is usually hard to reach btw. Does have apt on 9/13

## 2021-07-30 NOTE — Telephone Encounter (Signed)
Please send if appropriate.

## 2021-07-31 ENCOUNTER — Encounter: Payer: Self-pay | Admitting: Physician Assistant

## 2021-07-31 ENCOUNTER — Ambulatory Visit (INDEPENDENT_AMBULATORY_CARE_PROVIDER_SITE_OTHER): Payer: Medicare Other | Admitting: Physician Assistant

## 2021-07-31 ENCOUNTER — Other Ambulatory Visit: Payer: Self-pay

## 2021-07-31 VITALS — BP 109/69 | HR 67

## 2021-07-31 DIAGNOSIS — F319 Bipolar disorder, unspecified: Secondary | ICD-10-CM | POA: Diagnosis not present

## 2021-07-31 DIAGNOSIS — F4310 Post-traumatic stress disorder, unspecified: Secondary | ICD-10-CM

## 2021-07-31 DIAGNOSIS — Z630 Problems in relationship with spouse or partner: Secondary | ICD-10-CM

## 2021-07-31 DIAGNOSIS — F411 Generalized anxiety disorder: Secondary | ICD-10-CM | POA: Diagnosis not present

## 2021-07-31 MED ORDER — DIAZEPAM 5 MG PO TABS: 5.0000 mg | ORAL_TABLET | Freq: Two times a day (BID) | ORAL | 1 refills | Status: DC | PRN

## 2021-07-31 MED ORDER — GABAPENTIN 400 MG PO CAPS: ORAL_CAPSULE | ORAL | 1 refills | Status: DC

## 2021-07-31 MED ORDER — TOPIRAMATE 100 MG PO TABS: 150.0000 mg | ORAL_TABLET | Freq: Two times a day (BID) | ORAL | 1 refills | Status: DC

## 2021-07-31 MED ORDER — FLUVOXAMINE MALEATE 100 MG PO TABS: 200.0000 mg | ORAL_TABLET | Freq: Every day | ORAL | 1 refills | Status: DC

## 2021-07-31 MED ORDER — BUSPIRONE HCL 30 MG PO TABS: 30.0000 mg | ORAL_TABLET | Freq: Two times a day (BID) | ORAL | 1 refills | Status: DC

## 2021-07-31 NOTE — Progress Notes (Signed)
[facility] Med Check  Patient ID: Morgan [name],  MRN: [id number]  PCP: [facility]  Date of Evaluation: 07/31/2021 Time spent:30 minutes  Chief Complaint:  Chief Complaint   Anxiety; Depression     HISTORY/CURRENT STATUS: HPI For routine med check.  Caught her husband cheating on her in June. The 4th time. She's in counseling.Trying to work things out. Feels sad. Cries a lot. Before all this happened, she was doing well with current meds.   Daughter got married in April. Happy about that. Sleeps good. Has had more H/As. Sees neuro. Has had to go to ER for the migraines. Energy and motivation are good.  Appetite is normal.  Anxious but meds help. Not isolating. No SI/HI.   Patient denies increased energy with decreased need for sleep, no increased talkativeness, no racing thoughts, no impulsivity or risky behaviors, no increased spending, no increased libido, no grandiosity, is more angry because of the situation but otherwise no increased anger or irritability, no paranoia, and no hallucinations.  Review of Systems  Constitutional: Negative.   HENT: Negative.    Eyes: Negative.   Respiratory: Negative.    Cardiovascular: Negative.   Gastrointestinal: Negative.   Genitourinary: Negative.   Musculoskeletal: Negative.   Skin: Negative.   Neurological:  Positive for headaches.       Sees neurology for migraines.  Endo/Heme/Allergies: Negative.   Psychiatric/Behavioral:  Positive for depression. The patient is nervous/anxious.    Individual Medical History/ Review of Systems: Changes? :No   Past medications for mental health diagnoses include: Zoloft, trazodone, gabapentin, Risperdal, hydroxyzine, prazosin, nortriptyline, Adderall, Rexulti, Topamax, Latuda, Ingrezza lithium, Valium, clozapine, BuSpar, Zyprexa  Allergies: Clindamycin/lincomycin, Nsaids, Oxycodone, and Percocet [oxycodone-acetaminophen]  Current Medications:  Current Outpatient  Medications:    AIMOVIG 70 MG/ML SOAJ, Inject 70 mg into the muscle every 30 (thirty) days. , Disp: , Rfl: 11   AMITIZA 24 MCG capsule, Take 24 mcg by mouth 2 (two) times daily with a meal. , Disp: , Rfl:    buPROPion (WELLBUTRIN XL) 150 MG 24 hr tablet, TAKE 1 TABLET BY MOUTH  DAILY, Disp: 90 tablet, Rfl: 0   cyclobenzaprine (FLEXERIL) 10 MG tablet, Take 1 tablet (10 mg total) by mouth 3 (three) times daily as needed for muscle spasms., Disp: 270 tablet, Rfl: 0   fluticasone (FLONASE) 50 MCG/ACT nasal spray, Place 1 spray into both nostrils daily as needed for allergies or rhinitis., Disp: , Rfl:    hydrOXYzine (ATARAX/VISTARIL) 25 MG tablet, Take 1 tablet (25 mg total) by mouth 2 (two) times daily., Disp: 180 tablet, Rfl: 1   ipratropium-albuterol (DUONEB) 0.5-2.5 (3) MG/3ML SOLN, Inhale 3 mLs into the lungs every 6 (six) hours as needed (SOB, wheezing). , Disp: , Rfl:    ketorolac (TORADOL) 10 MG tablet, TK 1 T PO  Q 6 H PRF P, Disp: , Rfl: 0   levothyroxine (SYNTHROID, LEVOTHROID) 100 MCG tablet, Take 112 mcg by mouth daily before breakfast. , Disp: , Rfl:    linaclotide (LINZESS) 290 MCG CAPS capsule, Take 290 mcg by mouth daily before breakfast., Disp: , Rfl:    LYRICA 100 MG capsule, , Disp: , Rfl:    montelukast (SINGULAIR) 10 MG tablet, Take 10 mg by mouth at bedtime. , Disp: , Rfl:    montelukast (SINGULAIR) 10 MG tablet, TAKE 1 TABLET BY MOUTH NIGHTLY, Disp: , Rfl:    Multiple Vitamin (MULTI VITAMIN) TABS, Take 1 tablet by mouth daily., Disp: 90 tablet, Rfl:  1   OLANZapine (ZYPREXA) 20 MG tablet, TAKE 1 TABLET BY MOUTH AT  BEDTIME, Disp: 90 tablet, Rfl: 0   omeprazole (PRILOSEC) 40 MG capsule, Take 40 mg by mouth daily. , Disp: , Rfl:    OnabotulinumtoxinA (BOTOX IJ), Inject as directed every 3 (three) months., Disp: , Rfl:    ondansetron (ZOFRAN-ODT) 4 MG disintegrating tablet, Take 4 mg by mouth every 8 (eight) hours as needed for nausea. , Disp: , Rfl:    prazosin (MINIPRESS) 2 MG  capsule, TAKE 1 CAPSULE BY MOUTH AT  BEDTIME, Disp: 30 capsule, Rfl: 11   promethazine (PHENERGAN) 12.5 MG tablet, Take 12.5 mg by mouth every 6 (six) hours as needed for nausea or vomiting. , Disp: , Rfl:    topiramate (TOPAMAX) 100 MG tablet, TAKE 1 AND 1/2 TABLETS(150 MG) BY MOUTH TWICE DAILY, Disp: 45 tablet, Rfl: 0   traMADol (ULTRAM) 50 MG tablet, Take 50 mg by mouth every 6 (six) hours as needed for moderate pain (kidney stones)., Disp: , Rfl:    traZODone (DESYREL) 100 MG tablet, TAKE 1 AND 1/2 TABLETS BY  MOUTH AT BEDTIME AS NEEDED  FOR SLEEP, Disp: 135 tablet, Rfl: 0   busPIRone (BUSPAR) 30 MG tablet, Take 1 tablet (30 mg total) by mouth 2 (two) times daily., Disp: 180 tablet, Rfl: 1   diazepam (VALIUM) 5 MG tablet, Take 1 tablet (5 mg total) by mouth every 12 (twelve) hours as needed for anxiety., Disp: 180 tablet, Rfl: 1   fluvoxaMINE (LUVOX) 100 MG tablet, Take 2 tablets (200 mg total) by mouth at bedtime., Disp: 180 tablet, Rfl: 1   gabapentin (NEURONTIN) 400 MG capsule, TAKE 2 CAPSULES BY MOUTH IN THE MORNING, 2 CAPSULES AT  LUNCH, AND 4 CAPSULES AT  BEDTIME, Disp: 720 capsule, Rfl: 1   levothyroxine (SYNTHROID) 88 MCG tablet, Take 112 mcg by mouth daily before breakfast.  (Patient not taking: Reported on 07/31/2021), Disp: , Rfl:    phenazopyridine (PYRIDIUM) 200 MG tablet, Take 1 tablet (200 mg total) by mouth 3 (three) times daily as needed for pain. (Patient not taking: Reported on 07/31/2021), Disp: 6 tablet, Rfl: 0   predniSONE (DELTASONE) 10 MG tablet, Take 10 mg by mouth daily with breakfast.  (Patient not taking: No sig reported), Disp: , Rfl:    topiramate (TOPAMAX) 100 MG tablet, Take 1.5 tablets (150 mg total) by mouth 2 (two) times daily., Disp: 270 tablet, Rfl: 1   zonisamide (ZONEGRAN) 50 MG capsule, Take by mouth., Disp: , Rfl:  Medication Side Effects: none  Family Medical/ Social History: Changes?  No   MENTAL HEALTH EXAM:  Blood pressure 109/69, pulse 67.There is  no height or weight on file to calculate BMI.  General Appearance: Casual, Neat and Well Groomed  Eye Contact:  Good  Speech:  Clear and Coherent and Normal Rate  Volume:  Normal  Mood:   Sad  Affect:  Congruent  Thought Process:  Goal Directed and Descriptions of Associations: Intact  Orientation:  Full (Time, Place, and Person)  Thought Content: Logical   Suicidal Thoughts:  No  Homicidal Thoughts:  No  Memory:  WNL  Judgement:  Good  Insight:  Good  Psychomotor Activity:  Normal  Concentration:  Concentration: Good  Recall:  Good  Fund of Knowledge: Good  Language: Good  Assets:  Desire for Improvement  ADL's:  Intact  Cognition: WNL  Prognosis:  Good   Labs  07/18/2020 Glucose 89 Total cholesterol 182, triglycerides 64,  HDL 56, LDL 126  DIAGNOSES:    ICD-10-CM   1. Bipolar I disorder ([place])  F31.9     2. Stress due to marital problems  Z63.0     3. Generalized anxiety disorder  F41.1     4. PTSD (post-traumatic stress disorder)  F43.10        Receiving Psychotherapy: Yes   Jesse Sans   RECOMMENDATIONS:  PDMP was reviewed. Valium filled 03/19/2021 I provided 30 minutes of face to face time during this encounter, including time spent before and after the visit in records review, medical decision making, counseling pertinent to today's visit, and charting.  As far as her medications go, she is doing well, no changes will be made.  We agreed that we should have another visit sooner than our usual 6 months, even though we are not changing anything today.  I am glad that she is seeing a counselor now. Continue Wellbutrin XL 150 mg p.o. daily. Continue BuSpar 30 mg, 1 p.o. twice daily. Continue Valium 5 mg, 1 p.o. twice daily as needed. Continue Luvox 100 mg, 2 p.o. nightly. Continue gabapentin 400 mg, 2 p.o. every morning, 2 p.o. at lunch, and 4 p.o. nightly. Continue hydroxyzine 25 mg, 1 p.o. twice daily as needed. Continue Zyprexa 20 mg, 1 p.o. nightly. Continue  prazosin 2 mg, 1 p.o. nightly. Continue trazodone 100 mg, 1.5 pills nightly. Continue counseling. Return in 2-3 months.  Donnal Moat, PA-C

## 2021-09-21 ENCOUNTER — Other Ambulatory Visit: Payer: Self-pay | Admitting: Physician Assistant

## 2021-09-26 ENCOUNTER — Other Ambulatory Visit: Payer: Self-pay | Admitting: Physician Assistant

## 2021-11-01 ENCOUNTER — Ambulatory Visit (INDEPENDENT_AMBULATORY_CARE_PROVIDER_SITE_OTHER): Payer: Medicare Other | Admitting: Physician Assistant

## 2021-11-01 ENCOUNTER — Encounter: Payer: Self-pay | Admitting: Physician Assistant

## 2021-11-01 ENCOUNTER — Other Ambulatory Visit: Payer: Self-pay

## 2021-11-01 DIAGNOSIS — F4310 Post-traumatic stress disorder, unspecified: Secondary | ICD-10-CM | POA: Diagnosis not present

## 2021-11-01 DIAGNOSIS — F411 Generalized anxiety disorder: Secondary | ICD-10-CM

## 2021-11-01 DIAGNOSIS — G4700 Insomnia, unspecified: Secondary | ICD-10-CM | POA: Diagnosis not present

## 2021-11-01 DIAGNOSIS — F319 Bipolar disorder, unspecified: Secondary | ICD-10-CM

## 2021-11-01 MED ORDER — BUPROPION HCL ER (XL) 150 MG PO TB24: 150.0000 mg | ORAL_TABLET | Freq: Every day | ORAL | 1 refills | Status: DC

## 2021-11-01 MED ORDER — TRAZODONE HCL 100 MG PO TABS: ORAL_TABLET | ORAL | 1 refills | Status: DC

## 2021-11-01 MED ORDER — OLANZAPINE 20 MG PO TABS: 20.0000 mg | ORAL_TABLET | Freq: Every day | ORAL | 1 refills | Status: DC

## 2021-11-01 NOTE — Progress Notes (Signed)
[facility]  Patient ID: Morgan [name],  MRN: [id number]  PCP: [place]  Date of Evaluation: 11/01/2021 Time spent:30 minutes  Chief Complaint:  Chief Complaint   Anxiety; Depression; Insomnia; Follow-up    HISTORY/CURRENT STATUS: HPI For routine med check.  As far as mental health meds go, she's doing well. Under a lot of stress now, holidays, family drama, more headaches, her car broke down, step-daughter OD and has been in behavioral health.  The family drama will be better after Christmas.  Work is going okay.  Everything is a bit difficult but because of chronic migraines.  She was recently put on Seroquel for as needed use.  Not sure if it is helping or not.  Has seen her neurologist and had a spinal done.  States something was wrong but she has had a prior head injury and it is something that they watch.  She is able to enjoy things when she feels like it.  Denies decreased energy or motivation.  Appetite has not changed.  No extreme sadness, tearfulness, or feelings of hopelessness.  Denies any changes in concentration, making decisions or remembering things.  Sleeps good most of the time.  Trazodone helps.  Denies suicidal or homicidal thoughts.  Patient denies increased energy with decreased need for sleep, no increased talkativeness, no racing thoughts, no impulsivity or risky behaviors, no increased spending, no increased libido, no grandiosity, no increased irritability or anger, and no hallucinations.  Anxiety is still a problem but the Valium is effective.  Not really having panic attacks but more an underlying sense of unease a lot.  Denies dizziness, syncope, seizures, numbness, tingling, tremor, tics, unsteady gait, slurred speech, confusion.  Has chronic headaches, neck and back pain.  Individual Medical History/ Review of Systems: Changes? :No   Past medications for mental health diagnoses include: Zoloft, trazodone,  gabapentin, Risperdal, hydroxyzine, prazosin, nortriptyline, Adderall, Rexulti, Topamax, Latuda, Ingrezza lithium, Valium, clozapine, BuSpar, Zyprexa  Allergies: Clindamycin/lincomycin, Nsaids, Oxycodone, and Percocet [oxycodone-acetaminophen]  Current Medications:  Current Outpatient Medications:    AIMOVIG 70 MG/ML SOAJ, Inject 70 mg into the muscle every 30 (thirty) days. , Disp: , Rfl: 11   AMITIZA 24 MCG capsule, Take 24 mcg by mouth 2 (two) times daily with a meal. , Disp: , Rfl:    busPIRone (BUSPAR) 30 MG tablet, Take 1 tablet (30 mg total) by mouth 2 (two) times daily., Disp: 180 tablet, Rfl: 1   cyclobenzaprine (FLEXERIL) 10 MG tablet, Take 1 tablet (10 mg total) by mouth 3 (three) times daily as needed for muscle spasms., Disp: 270 tablet, Rfl: 0   diazepam (VALIUM) 5 MG tablet, Take 1 tablet (5 mg total) by mouth every 12 (twelve) hours as needed for anxiety., Disp: 180 tablet, Rfl: 1   fluticasone (FLONASE) 50 MCG/ACT nasal spray, Place 1 spray into both nostrils daily as needed for allergies or rhinitis., Disp: , Rfl:    fluvoxaMINE (LUVOX) 100 MG tablet, Take 2 tablets (200 mg total) by mouth at bedtime., Disp: 180 tablet, Rfl: 1   gabapentin (NEURONTIN) 400 MG capsule, TAKE 2 CAPSULES BY MOUTH IN THE MORNING, 2 CAPSULES AT  LUNCH, AND 4 CAPSULES AT  BEDTIME, Disp: 720 capsule, Rfl: 1   hydrOXYzine (ATARAX/VISTARIL) 25 MG tablet, Take 1 tablet (25 mg total) by mouth 2 (two) times daily., Disp: 180 tablet, Rfl: 1   ketorolac (TORADOL) 10 MG tablet, TK 1 T PO  Q 6 H PRF P, Disp: ,  Rfl: 0   levothyroxine (SYNTHROID, LEVOTHROID) 100 MCG tablet, Take 112 mcg by mouth daily before breakfast. , Disp: , Rfl:    linaclotide (LINZESS) 290 MCG CAPS capsule, Take 290 mcg by mouth daily before breakfast., Disp: , Rfl:    LYRICA 100 MG capsule, , Disp: , Rfl:    montelukast (SINGULAIR) 10 MG tablet, Take 10 mg by mouth at bedtime. , Disp: , Rfl:    montelukast (SINGULAIR) 10 MG tablet, TAKE 1  TABLET BY MOUTH NIGHTLY, Disp: , Rfl:    Multiple Vitamin (MULTI VITAMIN) TABS, Take 1 tablet by mouth daily., Disp: 90 tablet, Rfl: 1   omeprazole (PRILOSEC) 40 MG capsule, Take 40 mg by mouth daily. , Disp: , Rfl:    OnabotulinumtoxinA (BOTOX IJ), Inject as directed every 3 (three) months., Disp: , Rfl:    ondansetron (ZOFRAN-ODT) 4 MG disintegrating tablet, Take 4 mg by mouth every 8 (eight) hours as needed for nausea. , Disp: , Rfl:    prazosin (MINIPRESS) 2 MG capsule, TAKE 1 CAPSULE BY MOUTH AT  BEDTIME, Disp: 30 capsule, Rfl: 11   promethazine (PHENERGAN) 12.5 MG tablet, Take 12.5 mg by mouth every 6 (six) hours as needed for nausea or vomiting. , Disp: , Rfl:    QUEtiapine (SEROQUEL) 50 MG tablet, Take by mouth., Disp: , Rfl:    topiramate (TOPAMAX) 100 MG tablet, TAKE 1 AND 1/2 TABLETS(150 MG) BY MOUTH TWICE DAILY, Disp: 45 tablet, Rfl: 0   topiramate (TOPAMAX) 100 MG tablet, Take 1.5 tablets (150 mg total) by mouth 2 (two) times daily., Disp: 270 tablet, Rfl: 1   traMADol (ULTRAM) 50 MG tablet, Take 50 mg by mouth every 6 (six) hours as needed for moderate pain (kidney stones)., Disp: , Rfl:    buPROPion (WELLBUTRIN XL) 150 MG 24 hr tablet, Take 1 tablet (150 mg total) by mouth daily., Disp: 90 tablet, Rfl: 1   ipratropium-albuterol (DUONEB) 0.5-2.5 (3) MG/3ML SOLN, Inhale 3 mLs into the lungs every 6 (six) hours as needed (SOB, wheezing).  (Patient not taking: Reported on 11/01/2021), Disp: , Rfl:    levothyroxine (SYNTHROID) 88 MCG tablet, Take 112 mcg by mouth daily before breakfast.  (Patient not taking: Reported on 07/31/2021), Disp: , Rfl:    OLANZapine (ZYPREXA) 20 MG tablet, Take 1 tablet (20 mg total) by mouth at bedtime., Disp: 90 tablet, Rfl: 1   phenazopyridine (PYRIDIUM) 200 MG tablet, Take 1 tablet (200 mg total) by mouth 3 (three) times daily as needed for pain. (Patient not taking: Reported on 07/31/2021), Disp: 6 tablet, Rfl: 0   predniSONE (DELTASONE) 10 MG tablet, Take 10  mg by mouth daily with breakfast.  (Patient not taking: Reported on 01/23/2021), Disp: , Rfl:    traZODone (DESYREL) 100 MG tablet, TAKE 1 AND 1/2 TABLETS BY  MOUTH AT BEDTIME AS NEEDED  FOR SLEEP, Disp: 135 tablet, Rfl: 1   zonisamide (ZONEGRAN) 50 MG capsule, Take by mouth., Disp: , Rfl:  Medication Side Effects: none  Family Medical/ Social History: Changes?  No   MENTAL HEALTH EXAM:  There were no vitals taken for this visit.There is no height or weight on file to calculate BMI.  General Appearance: Casual, Neat and Well Groomed  Eye Contact:  Good  Speech:  Clear and Coherent and Normal Rate  Volume:  Normal  Mood:  Euthymic  Affect:  Congruent  Thought Process:  Goal Directed and Descriptions of Associations: Intact  Orientation:  Full (Time, Place, and Person)  Thought Content: Logical   Suicidal Thoughts:  No  Homicidal Thoughts:  No  Memory:  WNL  Judgement:  Good  Insight:  Good  Psychomotor Activity:  Normal  Concentration:  Concentration: Good and Attention Span: Good  Recall:  Good  Fund of Knowledge: Good  Language: Good  Assets:  Desire for Improvement  ADL's:  Intact  Cognition: WNL  Prognosis:  Good     DIAGNOSES:    ICD-10-CM   1. Bipolar I disorder ([place])  F31.9     2. PTSD (post-traumatic stress disorder)  F43.10     3. Generalized anxiety disorder  F41.1     4. Insomnia, unspecified type  G47.00         Receiving Psychotherapy: Yes   Jesse Sans   RECOMMENDATIONS:  PDMP was reviewed. Valium filled 07/31/2021. I provided 30 minutes of face to face time during this encounter, including time spent before and after the visit in records review, medical decision making, counseling pertinent to today's visit, and charting.  We discussed the fact that Seroquel is in the same drug family as Zyprexa.  There is no problem mixing the 2 but if she was on a higher dose of Seroquel on a regular basis along with the Zyprexa daily we would have to observe more  for movement disorders.  No concerns at this time, just wanted to inform her. She is doing well from a mental health medication standpoint so no changes will be made. Continue Wellbutrin XL 150 mg p.o. daily. Continue BuSpar 30 mg, 1 p.o. twice daily. Continue Valium 5 mg, 1 p.o. twice daily as needed. Continue Luvox 100 mg, 2 p.o. nightly. Continue gabapentin 400 mg, 2 p.o. every morning, 2 p.o. at lunch, and 4 p.o. nightly. Continue hydroxyzine 25 mg, 1 p.o. twice daily as needed. Continue Zyprexa 20 mg, 1 p.o. nightly. Continue prazosin 2 mg, 1 p.o. nightly. Continue Seroquel 50 mg, 1 p.o. as directed per neuro. Continue Topamax per neuro. Continue trazodone 100 mg, 1.5 pills nightly. Continue counseling. Return in 3 months.  Donnal Moat, PA-C

## 2021-11-17 ENCOUNTER — Other Ambulatory Visit: Payer: Self-pay | Admitting: Physician Assistant

## 2021-12-12 ENCOUNTER — Other Ambulatory Visit: Payer: Self-pay | Admitting: Physician Assistant

## 2021-12-15 ENCOUNTER — Other Ambulatory Visit: Payer: Self-pay | Admitting: Physician Assistant

## 2021-12-20 ENCOUNTER — Other Ambulatory Visit: Payer: Self-pay | Admitting: Physician Assistant

## 2022-01-05 IMAGING — CT CT RENAL STONE PROTOCOL
2 of 4 series · 16 of 46 positions shown, 18 images · non-contrast
Comparison: 02/05/2021

CLINICAL DATA: Left-sided flank pain

EXAM:
CT ABDOMEN AND PELVIS WITHOUT CONTRAST
TECHNIQUE: Multidetector CT imaging of the abdomen and pelvis was performed
following the standard protocol without IV contrast.

[Series 2: axial st · axial · 0.87mm/px · z∈[+406,+821]mm · 13 of 91 slices shown, 15 images]
[im 4/91  soft-tissue]
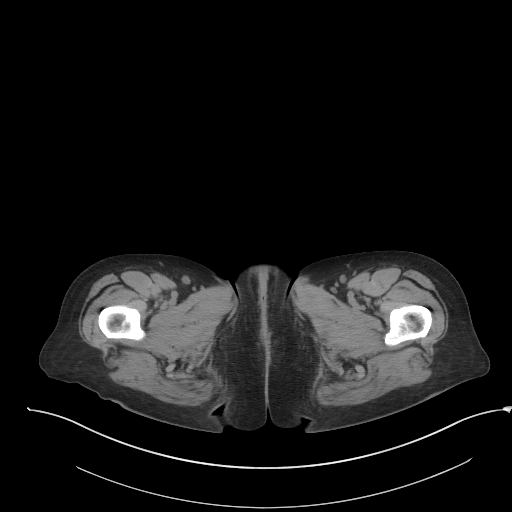
[im 4/91  bone]
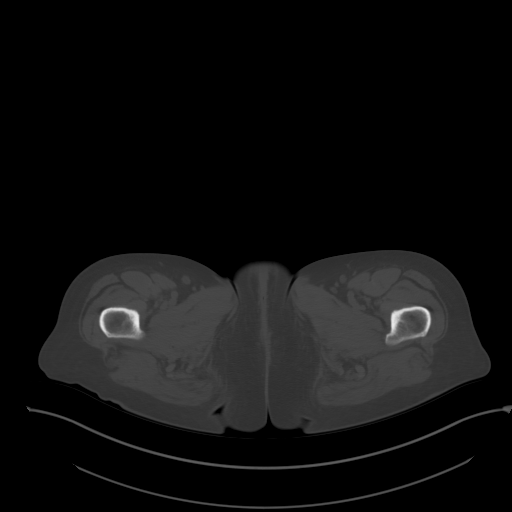
[im 11/91  soft-tissue]
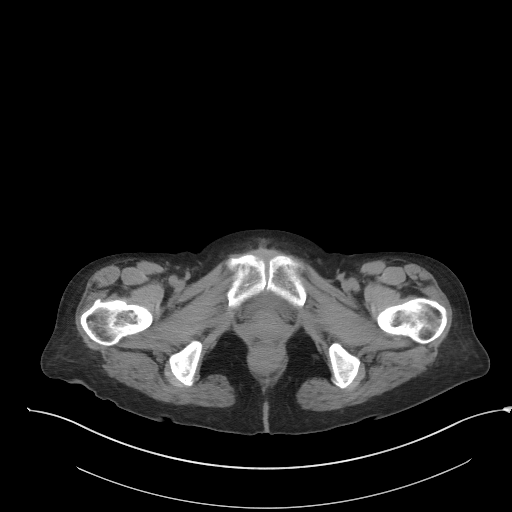
[im 18/91  soft-tissue]
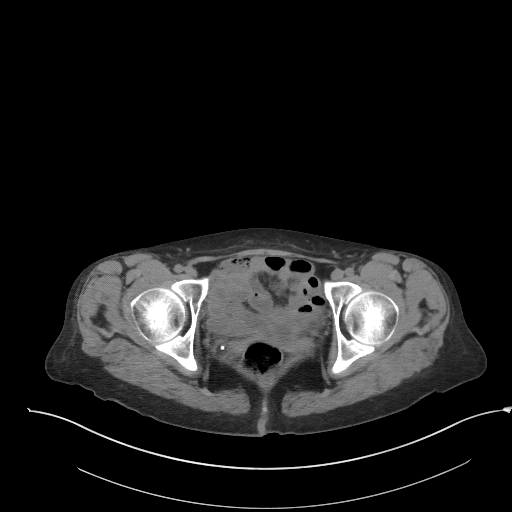
[im 25/91  soft-tissue]
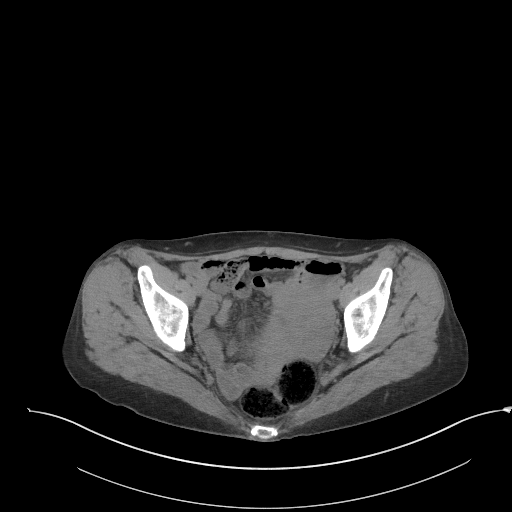
[im 32/91  soft-tissue]
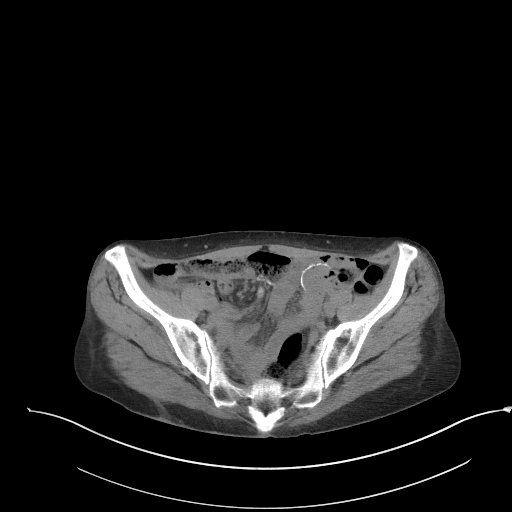
[im 39/91  soft-tissue]
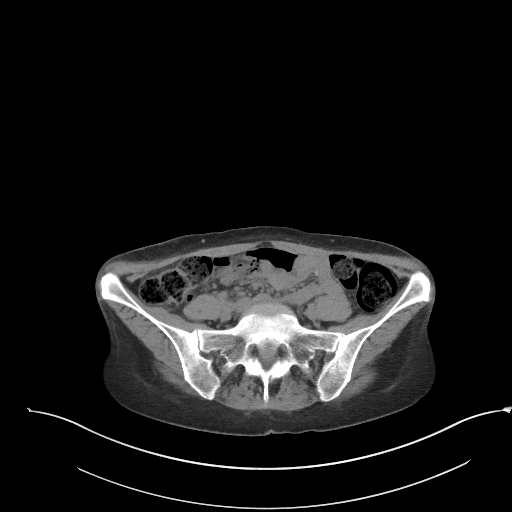
[im 46/91  soft-tissue]
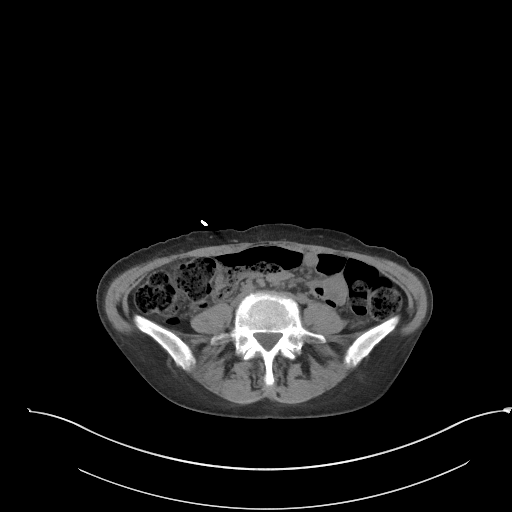
[im 52/91  soft-tissue]
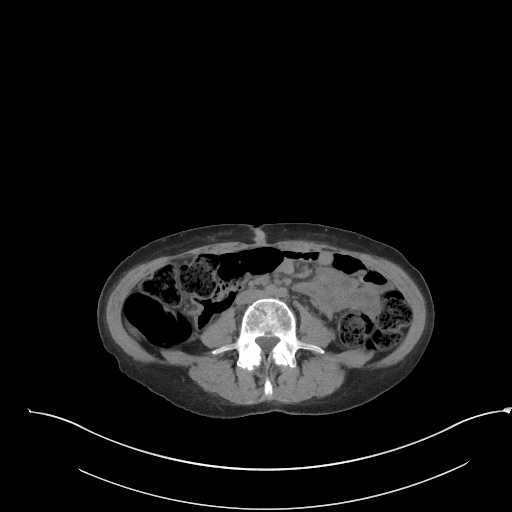
[im 59/91  soft-tissue]
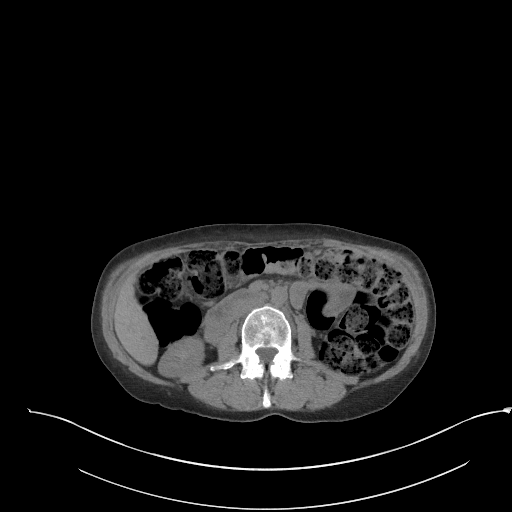
[im 59/91  bone]
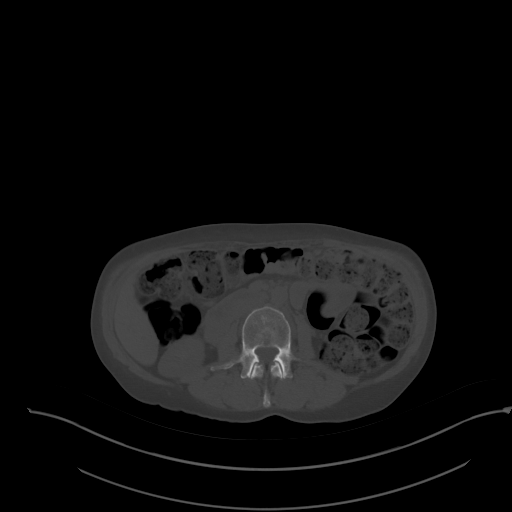
[im 66/91  soft-tissue]
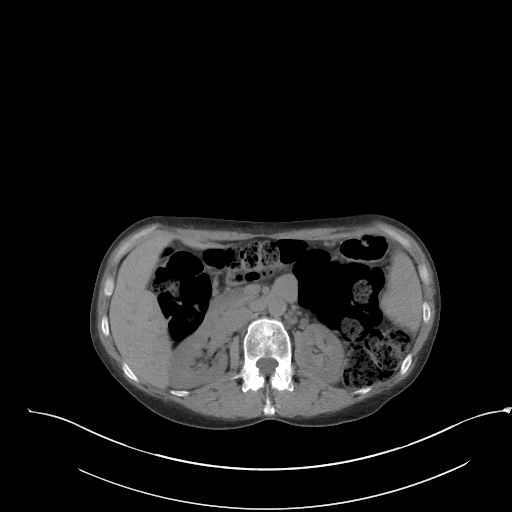
[im 73/91  soft-tissue]
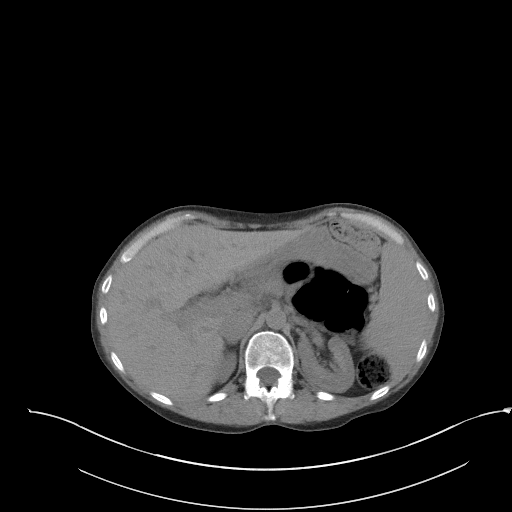
[im 80/91  soft-tissue]
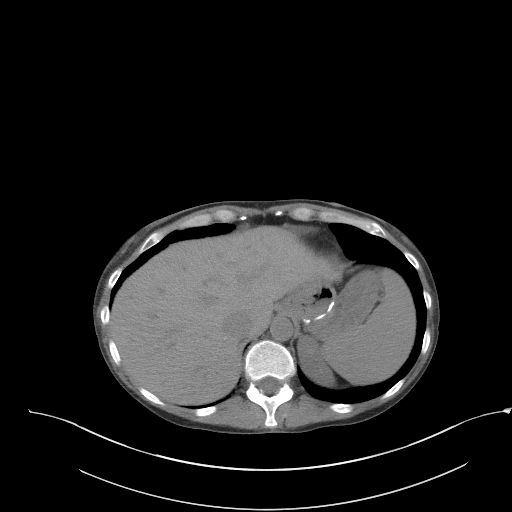
[im 87/91  soft-tissue]
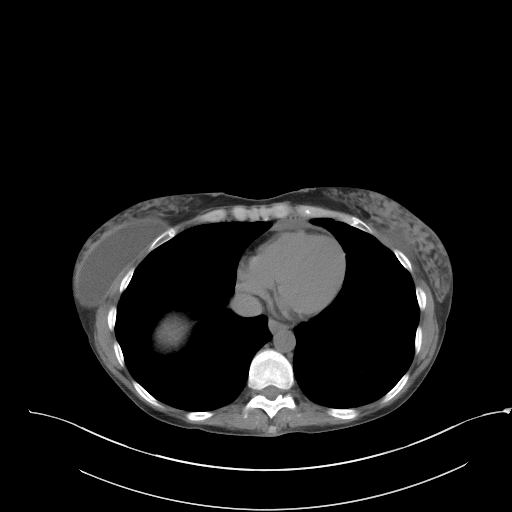

[Series 5: coronal st · coronal · 0.68mm/px · 3 of 74 slices shown]
[im 25/74  soft-tissue]
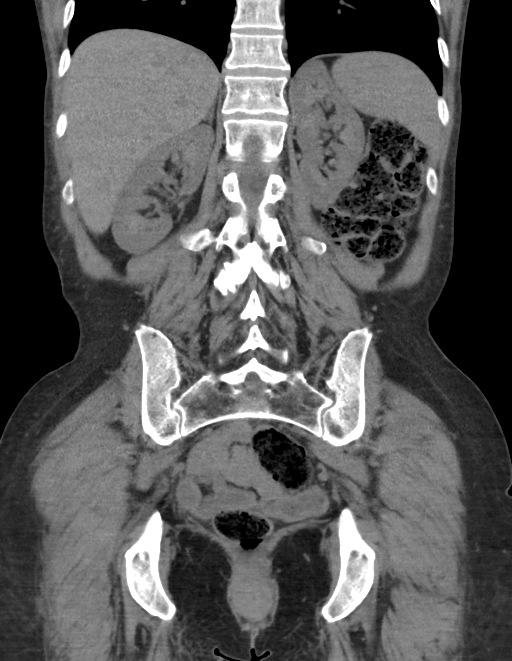
[im 33/74  soft-tissue]
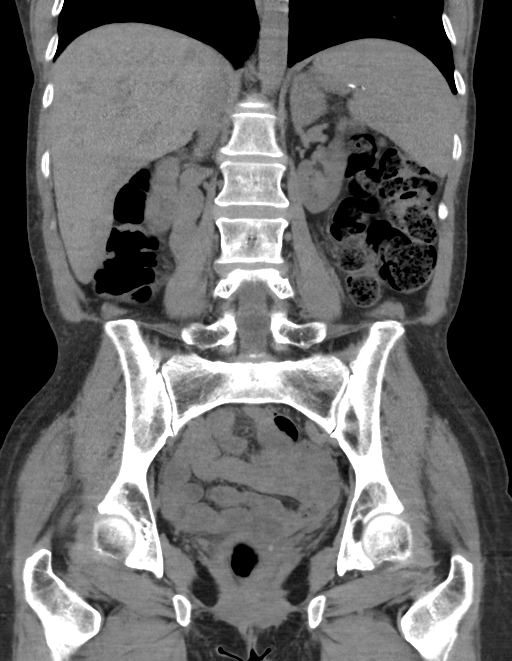
[im 41/74  soft-tissue]
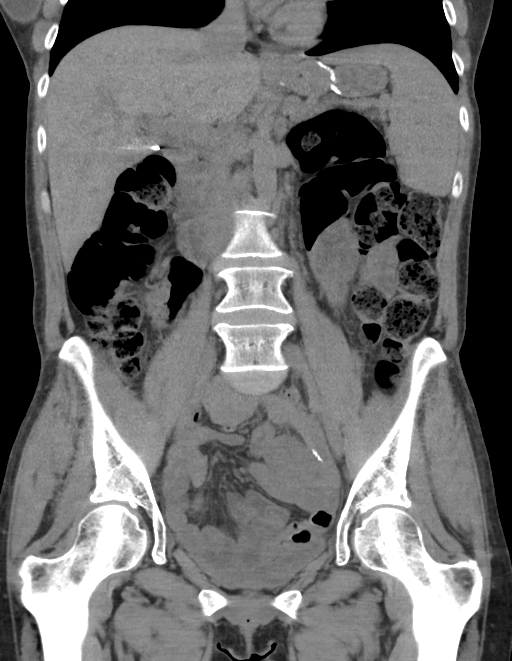

[16 of 46 positions shown; findings below may reference images not displayed]

FINDINGS: Lower chest: No acute abnormality. Breast implants are noted
bilaterally.

Hepatobiliary: No focal liver abnormality is seen. Status post
cholecystectomy. No biliary dilatation.

Pancreas: Unremarkable. No pancreatic ductal dilatation or
surrounding inflammatory changes.

Spleen: Normal in size without focal abnormality.

Adrenals/Urinary Tract: Adrenal glands are within normal limits.
Kidneys are well visualized bilaterally. Previously seen
nonobstructing left renal stone is again identified and stable. Tiny
stone is noted in the lower pole of the right kidney. No obstructive
changes are noted. Bladder is decompressed.

Stomach/Bowel: Postsurgical changes are noted in the small bowel. No
obstructive or inflammatory changes of the colon are seen. The
appendix is well visualized and within normal limits. Stomach shows
postsurgical changes as well.

Vascular/Lymphatic: No significant vascular findings are present. No
enlarged abdominal or pelvic lymph nodes.

Reproductive: Status post hysterectomy. No adnexal masses.

Other: No abdominal wall hernia or abnormality. No abdominopelvic
ascites.

Musculoskeletal: No acute or significant osseous findings.
IMPRESSION: Nonobstructing renal calculi similar to that seen on the prior exam.

No obstructive changes are noted in the kidneys.

Postsurgical changes consistent with prior gastric bypass.

## 2022-01-30 ENCOUNTER — Ambulatory Visit (INDEPENDENT_AMBULATORY_CARE_PROVIDER_SITE_OTHER): Payer: Medicare Other | Admitting: Physician Assistant

## 2022-01-30 ENCOUNTER — Other Ambulatory Visit: Payer: Self-pay

## 2022-01-30 ENCOUNTER — Encounter: Payer: Self-pay | Admitting: Physician Assistant

## 2022-01-30 DIAGNOSIS — G4700 Insomnia, unspecified: Secondary | ICD-10-CM | POA: Diagnosis not present

## 2022-01-30 DIAGNOSIS — F4323 Adjustment disorder with mixed anxiety and depressed mood: Secondary | ICD-10-CM | POA: Diagnosis not present

## 2022-01-30 DIAGNOSIS — F4310 Post-traumatic stress disorder, unspecified: Secondary | ICD-10-CM | POA: Diagnosis not present

## 2022-01-30 DIAGNOSIS — F319 Bipolar disorder, unspecified: Secondary | ICD-10-CM | POA: Diagnosis not present

## 2022-01-30 MED ORDER — BUSPIRONE HCL 30 MG PO TABS: 30.0000 mg | ORAL_TABLET | Freq: Two times a day (BID) | ORAL | 1 refills | Status: DC

## 2022-01-30 MED ORDER — TOPIRAMATE 100 MG PO TABS: ORAL_TABLET | ORAL | 1 refills | Status: DC

## 2022-01-30 MED ORDER — GABAPENTIN 400 MG PO CAPS: ORAL_CAPSULE | ORAL | 1 refills | Status: DC

## 2022-01-30 MED ORDER — FLUVOXAMINE MALEATE 100 MG PO TABS: 200.0000 mg | ORAL_TABLET | Freq: Every day | ORAL | 1 refills | Status: DC

## 2022-01-30 MED ORDER — BUPROPION HCL ER (XL) 300 MG PO TB24: 300.0000 mg | ORAL_TABLET | Freq: Every day | ORAL | 1 refills | Status: DC

## 2022-01-30 NOTE — Progress Notes (Signed)
[facility] ? ?Patient ID: Morgan [name],  ?MRN: [id number] ? ?PCP: [place] ? ?Date of Evaluation: 01/30/2022  ?Time spent:30 minutes ? ?Chief Complaint:  ?Chief Complaint   ?Anxiety; Depression; Insomnia; Follow-up ?  ? ?HISTORY/CURRENT STATUS: ?HPI For routine med check. ? ?Having a hard time since she had surgery 12/18/2021. See ROS. Surgeon may send her to pain management.  Hurts every day.  The hydrocodone does not help the pain.  Valium is not helping with the nerve pain either.  States the surgeon increased the dose to 4 times daily for this reason. ? ?It is hard to say how she would be feeling mentally, if she was not going through all the pain that she is dealing with.  She does not have any motivation or energy though that is a big thing.  Having trouble falling asleep and worrying a lot too.  Trazodone does help though.  A lot of the reason she cannot sleep is because of the pain in her chest.  Anxiety is not as bad since taking the Valium every 6 hours instead of every 12 hours.  Appetite has not changed.  No extreme sadness, tearfulness, or feelings of hopelessness.  Denies any changes in concentration, making decisions or remembering things.  Denies suicidal or homicidal thoughts. ? ?Patient denies increased energy with decreased need for sleep, no increased talkativeness, no racing thoughts, no impulsivity or risky behaviors, no increased spending, no increased libido, no grandiosity, no increased irritability or anger, no paranoia.  And no hallucinations. ? ?Denies dizziness, syncope, seizures, numbness, tingling, tremor, tics, unsteady gait, slurred speech, confusion.  Has chronic headaches, neck and back pain. ? ?Individual Medical History/ Review of Systems: Changes? :Yes   had bilat breast implants replaced in Jan, left one was leaking, she also had a mass on the right, not malignant.  See HPI. ? ?Past medications for mental health diagnoses  include: ?Zoloft, trazodone, gabapentin, Risperdal, hydroxyzine, prazosin, nortriptyline, Adderall, Rexulti, Topamax, Latuda, Ingrezza lithium, Valium, clozapine, BuSpar, Zyprexa ? ?Allergies: Clindamycin/lincomycin, Nsaids, Oxycodone, and Percocet [oxycodone-acetaminophen] ? ?Current Medications:  ?Current Outpatient Medications:  ?  AIMOVIG 70 MG/ML SOAJ, Inject 70 mg into the muscle every 30 (thirty) days. , Disp: , Rfl: 11 ?  AMITIZA 24 MCG capsule, Take 24 mcg by mouth 2 (two) times daily with a meal. , Disp: , Rfl:  ?  buPROPion (WELLBUTRIN XL) 300 MG 24 hr tablet, Take 1 tablet (300 mg total) by mouth daily., Disp: 30 tablet, Rfl: 1 ?  cyclobenzaprine (FLEXERIL) 10 MG tablet, Take 1 tablet (10 mg total) by mouth 3 (three) times daily as needed for muscle spasms., Disp: 270 tablet, Rfl: 0 ?  diazepam (VALIUM) 5 MG tablet, Take 1 tablet (5 mg total) by mouth every 12 (twelve) hours as needed for anxiety. (Patient taking differently: Take 5 mg by mouth every 6 (six) hours as needed for anxiety.), Disp: 180 tablet, Rfl: 1 ?  fluticasone (FLONASE) 50 MCG/ACT nasal spray, Place 1 spray into both nostrils daily as needed for allergies or rhinitis., Disp: , Rfl:  ?  HYDROcodone-acetaminophen (NORCO/VICODIN) 5-325 MG tablet, Take 1 tablet by mouth every 6 (six) hours as needed for moderate pain., Disp: , Rfl:  ?  hydrOXYzine (ATARAX/VISTARIL) 25 MG tablet, Take 1 tablet (25 mg total) by mouth 2 (two) times daily., Disp: 180 tablet, Rfl: 1 ?  ketorolac (TORADOL) 10 MG tablet, TK 1 T PO  Q 6 H PRF P, Disp: ,  Rfl: 0 ?  levothyroxine (SYNTHROID, LEVOTHROID) 100 MCG tablet, Take 112 mcg by mouth daily before breakfast. , Disp: , Rfl:  ?  linaclotide (LINZESS) 290 MCG CAPS capsule, Take 290 mcg by mouth daily before breakfast., Disp: , Rfl:  ?  LYRICA 100 MG capsule, , Disp: , Rfl:  ?  montelukast (SINGULAIR) 10 MG tablet, Take 10 mg by mouth at bedtime. , Disp: , Rfl:  ?  montelukast (SINGULAIR) 10 MG tablet, TAKE 1 TABLET  BY MOUTH NIGHTLY, Disp: , Rfl:  ?  Multiple Vitamin (MULTI VITAMIN) TABS, Take 1 tablet by mouth daily., Disp: 90 tablet, Rfl: 1 ?  OLANZapine (ZYPREXA) 20 MG tablet, TAKE 1 TABLET BY MOUTH AT  BEDTIME, Disp: 90 tablet, Rfl: 0 ?  omeprazole (PRILOSEC) 40 MG capsule, Take 40 mg by mouth daily. , Disp: , Rfl:  ?  OnabotulinumtoxinA (BOTOX IJ), Inject as directed every 3 (three) months., Disp: , Rfl:  ?  ondansetron (ZOFRAN-ODT) 4 MG disintegrating tablet, Take 4 mg by mouth every 8 (eight) hours as needed for nausea. , Disp: , Rfl:  ?  prazosin (MINIPRESS) 2 MG capsule, TAKE 1 CAPSULE BY MOUTH AT  BEDTIME, Disp: 90 capsule, Rfl: 3 ?  promethazine (PHENERGAN) 12.5 MG tablet, Take 12.5 mg by mouth every 6 (six) hours as needed for nausea or vomiting. , Disp: , Rfl:  ?  topiramate (TOPAMAX) 100 MG tablet, TAKE 1 AND 1/2 TABLETS(150 MG) BY MOUTH TWICE DAILY, Disp: 45 tablet, Rfl: 0 ?  traMADol (ULTRAM) 50 MG tablet, Take 50 mg by mouth every 6 (six) hours as needed for moderate pain (kidney stones)., Disp: , Rfl:  ?  traZODone (DESYREL) 100 MG tablet, TAKE 1 AND 1/2 TABLETS BY MOUTH  AT BEDTIME AS NEEDED FOR SLEEP, Disp: 135 tablet, Rfl: 3 ?  busPIRone (BUSPAR) 30 MG tablet, Take 1 tablet (30 mg total) by mouth 2 (two) times daily., Disp: 180 tablet, Rfl: 1 ?  fluvoxaMINE (LUVOX) 100 MG tablet, Take 2 tablets (200 mg total) by mouth at bedtime., Disp: 180 tablet, Rfl: 1 ?  gabapentin (NEURONTIN) 400 MG capsule, TAKE 2 CAPSULES BY MOUTH IN THE MORNING, 2 CAPSULES AT  LUNCH, AND 4 CAPSULES AT  BEDTIME, Disp: 720 capsule, Rfl: 1 ?  ipratropium-albuterol (DUONEB) 0.5-2.5 (3) MG/3ML SOLN, Inhale 3 mLs into the lungs every 6 (six) hours as needed (SOB, wheezing).  (Patient not taking: Reported on 11/01/2021), Disp: , Rfl:  ?  levothyroxine (SYNTHROID) 88 MCG tablet, Take 112 mcg by mouth daily before breakfast.  (Patient not taking: Reported on 07/31/2021), Disp: , Rfl:  ?  phenazopyridine (PYRIDIUM) 200 MG tablet, Take 1 tablet  (200 mg total) by mouth 3 (three) times daily as needed for pain. (Patient not taking: Reported on 07/31/2021), Disp: 6 tablet, Rfl: 0 ?  predniSONE (DELTASONE) 10 MG tablet, Take 10 mg by mouth daily with breakfast.  (Patient not taking: Reported on 01/23/2021), Disp: , Rfl:  ?  QUEtiapine (SEROQUEL) 50 MG tablet, Take by mouth., Disp: , Rfl:  ?  topiramate (TOPAMAX) 100 MG tablet, take one and one-half tablets by mouth twice a day, Disp: 270 tablet, Rfl: 1 ?  zonisamide (ZONEGRAN) 50 MG capsule, Take by mouth., Disp: , Rfl:  ?Medication Side Effects: none ? ?Family Medical/ Social History: Changes?  No ? ? ?MENTAL HEALTH EXAM: ? ?There were no vitals taken for this visit.There is no height or weight on file to calculate BMI.  ?General Appearance: Casual, Neat and  Well Groomed  ?Eye Contact:  Good  ?Speech:  Clear and Coherent and Normal Rate  ?Volume:  Normal  ?Mood:  Depressed  ?Affect:  Congruent  ?Thought Process:  Goal Directed and Descriptions of Associations: Intact  ?Orientation:  Full (Time, Place, and Person)  ?Thought Content: Logical   ?Suicidal Thoughts:  No  ?Homicidal Thoughts:  No  ?Memory:  WNL  ?Judgement:  Good  ?Insight:  Good  ?Psychomotor Activity:  Normal  ?Concentration:  Concentration: Good and Attention Span: Good  ?Recall:  Good  ?Fund of Knowledge: Good  ?Language: Good  ?Assets:  Desire for Improvement  ?ADL's:  Intact  ?Cognition: WNL  ?Prognosis:  Good  ? ? ? ?DIAGNOSES:  ?  ICD-10-CM   ?1. Bipolar depression ([place])  F31.9   ?  ?2. Situational mixed anxiety and depressive disorder  F43.23   ?  ?3. PTSD (post-traumatic stress disorder)  F43.10   ?  ?4. Insomnia, unspecified type  G47.00   ?  ? ? ? ? ? ?Receiving Psychotherapy: Yes   Jesse Sans ? ? ?RECOMMENDATIONS:  ?PDMP was reviewed. Valium filled 01/23/2022  ?I provided 30 minutes of face to face time during this encounter, including time spent before and after the visit in records review, medical decision making, counseling pertinent to  today's visit, and charting.  ?Recommend increasing the Wellbutrin to help with the melancholy, lack of energy and motivation.  She would like to try it. ? ?Increase Wellbutrin XL to 300 mg, 1 p.o. every mornin

## 2022-02-10 ENCOUNTER — Emergency Department (HOSPITAL_BASED_OUTPATIENT_CLINIC_OR_DEPARTMENT_OTHER): Payer: Medicare Other

## 2022-02-10 ENCOUNTER — Other Ambulatory Visit: Payer: Self-pay

## 2022-02-10 ENCOUNTER — Encounter (HOSPITAL_BASED_OUTPATIENT_CLINIC_OR_DEPARTMENT_OTHER): Payer: Self-pay

## 2022-02-10 ENCOUNTER — Emergency Department (HOSPITAL_BASED_OUTPATIENT_CLINIC_OR_DEPARTMENT_OTHER)
Admission: EM | Admit: 2022-02-10 | Discharge: 2022-02-10 | Disposition: A | Discharge status: Home / Self Care | Payer: Medicare Other | Attending: Emergency Medicine | Admitting: Emergency Medicine

## 2022-02-10 DIAGNOSIS — Z79899 Other long term (current) drug therapy: Secondary | ICD-10-CM | POA: Insufficient documentation

## 2022-02-10 DIAGNOSIS — I959 Hypotension, unspecified: Secondary | ICD-10-CM | POA: Diagnosis not present

## 2022-02-10 DIAGNOSIS — N132 Hydronephrosis with renal and ureteral calculous obstruction: Secondary | ICD-10-CM | POA: Insufficient documentation

## 2022-02-10 DIAGNOSIS — E039 Hypothyroidism, unspecified: Secondary | ICD-10-CM | POA: Diagnosis not present

## 2022-02-10 DIAGNOSIS — N201 Calculus of ureter: Secondary | ICD-10-CM

## 2022-02-10 DIAGNOSIS — R1032 Left lower quadrant pain: Secondary | ICD-10-CM | POA: Diagnosis present

## 2022-02-10 LAB — CBC WITH DIFFERENTIAL/PLATELET
Abs Immature Granulocytes: 0.01 10*3/uL (ref 0.00–0.07)
Basophils Absolute: 0 10*3/uL (ref 0.0–0.1)
Basophils Relative: 1 %
Eosinophils Absolute: 0.3 10*3/uL (ref 0.0–0.5)
Eosinophils Relative: 6 %
HCT: 39.5 % (ref 36.0–46.0)
Hemoglobin: 13.1 g/dL (ref 12.0–15.0)
Immature Granulocytes: 0 %
Lymphocytes Relative: 30 %
Lymphs Abs: 1.5 10*3/uL (ref 0.7–4.0)
MCH: 28.7 pg (ref 26.0–34.0)
MCHC: 33.2 g/dL (ref 30.0–36.0)
MCV: 86.4 fL (ref 80.0–100.0)
Monocytes Absolute: 0.4 10*3/uL (ref 0.1–1.0)
Monocytes Relative: 8 %
Neutro Abs: 2.8 10*3/uL (ref 1.7–7.7)
Neutrophils Relative %: 55 %
Platelets: 184 10*3/uL (ref 150–400)
RBC: 4.57 MIL/uL (ref 3.87–5.11)
RDW: 13.1 % (ref 11.5–15.5)
WBC: 5.1 10*3/uL (ref 4.0–10.5)
nRBC: 0 % (ref 0.0–0.2)

## 2022-02-10 LAB — BASIC METABOLIC PANEL
Anion gap: 4 — ABNORMAL LOW (ref 5–15)
BUN: 13 mg/dL (ref 6–20)
CO2: 22 mmol/L (ref 22–32)
Calcium: 8.4 mg/dL — ABNORMAL LOW (ref 8.9–10.3)
Chloride: 113 mmol/L — ABNORMAL HIGH (ref 98–111)
Creatinine, Ser: 0.88 mg/dL (ref 0.44–1.00)
GFR, Estimated: >60 mL/min (ref >60)
Glucose, Bld: 101 mg/dL — ABNORMAL HIGH (ref 70–99)
Potassium: 3.5 mmol/L (ref 3.5–5.1)
Sodium: 139 mmol/L (ref 135–145)

## 2022-02-10 LAB — URINALYSIS, ROUTINE W REFLEX MICROSCOPIC
Bilirubin Urine: NEGATIVE
Glucose, UA: NEGATIVE mg/dL
Ketones, ur: NEGATIVE mg/dL
Nitrite: NEGATIVE
Protein, ur: NEGATIVE mg/dL
Specific Gravity, Urine: >=1.03 (ref 1.005–1.030)
pH: 5.5 (ref 5.0–8.0)

## 2022-02-10 LAB — URINALYSIS, MICROSCOPIC (REFLEX)

## 2022-02-10 MED ORDER — HYDROCODONE-ACETAMINOPHEN 5-325 MG PO TABS: 2.0000 | ORAL_TABLET | Freq: Four times a day (QID) | ORAL | 0 refills | Status: DC

## 2022-02-10 MED ORDER — TAMSULOSIN HCL 0.4 MG PO CAPS: 0.4000 mg | ORAL_CAPSULE | Freq: Every day | ORAL | 0 refills | Status: AC

## 2022-02-10 MED ORDER — TAMSULOSIN HCL 0.4 MG PO CAPS: 0.4000 mg | ORAL_CAPSULE | Freq: Every day | ORAL | 0 refills | Status: DC

## 2022-02-10 MED ORDER — KETOROLAC TROMETHAMINE 15 MG/ML IJ SOLN: 15.0000 mg | Freq: Once | INTRAMUSCULAR | Status: AC

## 2022-02-10 MED ORDER — FENTANYL CITRATE PF 50 MCG/ML IJ SOSY
12.5000 ug | PREFILLED_SYRINGE | Freq: Once | INTRAMUSCULAR | Status: AC
  Administered 2022-02-10: 12.5 ug via INTRAVENOUS
  Filled 2022-02-10: qty 1

## 2022-02-10 MED ORDER — HYDROCODONE-ACETAMINOPHEN 5-325 MG PO TABS
2.0000 | ORAL_TABLET | Freq: Four times a day (QID) | ORAL | 0 refills | Status: DC
Start: 2022-02-10 — End: 2022-02-10

## 2022-02-10 MED ORDER — KETOROLAC TROMETHAMINE 15 MG/ML IJ SOLN
INTRAMUSCULAR | Status: AC
  Administered 2022-02-10: 15 mg via INTRAVENOUS
  Filled 2022-02-10: qty 1

## 2022-02-10 MED ORDER — SODIUM CHLORIDE 0.9 % IV BOLUS
1000.0000 mL | Freq: Once | INTRAVENOUS | Status: AC
  Administered 2022-02-10: 1000 mL via INTRAVENOUS

## 2022-02-10 NOTE — ED Triage Notes (Signed)
Pt c/o left flank pain since this morning with nausea. Hx of kidney stones, feels similar. Difficulty urinating. ?

## 2022-02-10 NOTE — Discharge Instructions (Signed)
Please get your prescription at the pharmacy and take as prescribed.  Please follow-up with your primary care provider or urologist.  Return to the emergency room for any worsening symptoms. ?

## 2022-02-10 NOTE — ED Provider Notes (Signed)
?[facility] ?Provider Note ? ? ?CSN: [id number] ?Arrival date & time: 02/10/22  1243 ? ?  ? ?History ?Chief Complaint  ?Patient presents with  ? Flank Pain  ? ? ?Morgan [name] is a 51 y.o. female with history of kidney stones who presents to the emergency department with left-sided flank pain that radiates into the left lower quadrant and groin that woke her from sleep this morning.  She describes as a very sharp sensation and is waxing and waning.  He states this feels similar to previous kidney stones in the past.  She does report associated nausea but no vomiting, diarrhea, fever, chills.  Patient does report associated urinary urgency but denies any dysuria or hematuria. ? ? ?Flank Pain ? ? ?  ? ?Home Medications ?Prior to Admission medications   ?Medication Sig Start Date End Date Taking? Authorizing Provider  ?HYDROcodone-acetaminophen (NORCO/VICODIN) 5-325 MG tablet Take 2 tablets by mouth every 6 (six) hours. 02/10/22  Yes Raul Del, Devlyn Parish M, PA-C  ?tamsulosin (FLOMAX) 0.4 MG CAPS capsule Take 1 capsule (0.4 mg total) by mouth daily. 02/10/22  Yes Hendricks Limes, PA-C  ?AIMOVIG 70 MG/ML SOAJ Inject 70 mg into the muscle every 30 (thirty) days.  10/13/17   [provider]  ?AMITIZA 24 MCG capsule Take 24 mcg by mouth 2 (two) times daily with a meal.  09/24/17   [provider]  ?buPROPion (WELLBUTRIN XL) 300 MG 24 hr tablet Take 1 tablet (300 mg total) by mouth daily. 01/30/22   Donnal Moat T, PA-C  ?busPIRone (BUSPAR) 30 MG tablet Take 1 tablet (30 mg total) by mouth 2 (two) times daily. 01/30/22   Donnal Moat T, PA-C  ?cyclobenzaprine (FLEXERIL) 10 MG tablet Take 1 tablet (10 mg total) by mouth 3 (three) times daily as needed for muscle spasms. 11/04/18   Shugart, Lissa Hoard, PA-C  ?diazepam (VALIUM) 5 MG tablet Take 1 tablet (5 mg total) by mouth every 12 (twelve) hours as needed for anxiety. ?Patient taking differently: Take 5 mg by mouth every 6 (six)  hours as needed for anxiety. 07/31/21   Addison Lank, PA-C  ?fluticasone (FLONASE) 50 MCG/ACT nasal spray Place 1 spray into both nostrils daily as needed for allergies or rhinitis.    [provider]  ?fluvoxaMINE (LUVOX) 100 MG tablet Take 2 tablets (200 mg total) by mouth at bedtime. 01/30/22   Donnal Moat T, PA-C  ?gabapentin (NEURONTIN) 400 MG capsule TAKE 2 CAPSULES BY MOUTH IN THE MORNING, 2 CAPSULES AT  LUNCH, AND 4 CAPSULES AT  BEDTIME 01/30/22   Donnal Moat T, PA-C  ?hydrOXYzine (ATARAX/VISTARIL) 25 MG tablet Take 1 tablet (25 mg total) by mouth 2 (two) times daily. 07/27/19   Mozingo, Berdie Ogren, NP  ?ipratropium-albuterol (DUONEB) 0.5-2.5 (3) MG/3ML SOLN Inhale 3 mLs into the lungs every 6 (six) hours as needed (SOB, wheezing).  ?Patient not taking: Reported on 11/01/2021    [provider]  ?ketorolac (TORADOL) 10 MG tablet TK 1 T PO  Q 6 H PRF P 10/16/17   [provider]  ?levothyroxine (SYNTHROID) 88 MCG tablet Take 112 mcg by mouth daily before breakfast.  ?Patient not taking: Reported on 07/31/2021    [provider]  ?levothyroxine (SYNTHROID, LEVOTHROID) 100 MCG tablet Take 112 mcg by mouth daily before breakfast.  09/09/17   [provider]  ?linaclotide (LINZESS) 290 MCG CAPS capsule Take 290 mcg by mouth daily before breakfast.    [provider]  ?  LYRICA 100 MG capsule  10/23/17   [provider]  ?montelukast (SINGULAIR) 10 MG tablet Take 10 mg by mouth at bedtime.  02/05/16   [provider]  ?montelukast (SINGULAIR) 10 MG tablet TAKE 1 TABLET BY MOUTH NIGHTLY 02/05/16   [provider]  ?Multiple Vitamin (MULTI VITAMIN) TABS Take 1 tablet by mouth daily. 09/23/19   Addison Lank, PA-C  ?OLANZapine (ZYPREXA) 20 MG tablet TAKE 1 TABLET BY MOUTH AT  BEDTIME 12/13/21   Donnal Moat T, PA-C  ?omeprazole (PRILOSEC) 40 MG capsule Take 40 mg by mouth daily.  07/29/17   [provider]  ?OnabotulinumtoxinA  (BOTOX IJ) Inject as directed every 3 (three) months.    [provider]  ?ondansetron (ZOFRAN-ODT) 4 MG disintegrating tablet Take 4 mg by mouth every 8 (eight) hours as needed for nausea.  02/05/16   [provider]  ?phenazopyridine (PYRIDIUM) 200 MG tablet Take 1 tablet (200 mg total) by mouth 3 (three) times daily as needed for pain. ?Patient not taking: Reported on 07/31/2021 02/13/21   Palumbo, April, MD  ?prazosin (MINIPRESS) 2 MG capsule TAKE 1 CAPSULE BY MOUTH AT  BEDTIME 11/20/21   Donnal Moat T, PA-C  ?predniSONE (DELTASONE) 10 MG tablet Take 10 mg by mouth daily with breakfast.  ?Patient not taking: Reported on 01/23/2021 10/23/17   [provider]  ?promethazine (PHENERGAN) 12.5 MG tablet Take 12.5 mg by mouth every 6 (six) hours as needed for nausea or vomiting.  10/29/17   [provider]  ?QUEtiapine (SEROQUEL) 50 MG tablet Take by mouth. 10/18/21 11/17/21  [provider]  ?topiramate (TOPAMAX) 100 MG tablet TAKE 1 AND 1/2 TABLETS(150 MG) BY MOUTH TWICE DAILY 07/24/21   Donnal Moat T, PA-C  ?topiramate (TOPAMAX) 100 MG tablet take one and one-half tablets by mouth twice a day 01/30/22   Donnal Moat T, PA-C  ?traMADol (ULTRAM) 50 MG tablet Take 50 mg by mouth every 6 (six) hours as needed for moderate pain (kidney stones).    [provider]  ?traZODone (DESYREL) 100 MG tablet TAKE 1 AND 1/2 TABLETS BY MOUTH  AT BEDTIME AS NEEDED FOR SLEEP 12/21/21   Donnal Moat T, PA-C  ?zonisamide (ZONEGRAN) 50 MG capsule Take by mouth. 02/18/20 02/17/21  [provider]  ?   ? ?Allergies    ?Clindamycin/lincomycin, Nsaids, Oxycodone, and Percocet [oxycodone-acetaminophen]   ? ?Review of Systems   ?Review of Systems  ?Genitourinary:  Positive for flank pain.  ?All other systems reviewed and are negative. ? ?Physical Exam ?Updated Vital Signs ?BP 97/64   Pulse 66   Temp 97.8 ?F (36.6 ?C) (Oral)   Resp 16   Ht '5\' 3"'$  (1.6 m)   Wt 52.2 kg   SpO2 100%   BMI  20.37 kg/m?  ?Physical Exam ?Vitals and nursing note reviewed.  ?Constitutional:   ?   General: She is not in acute distress. ?   Appearance: Normal appearance.  ?HENT:  ?   Head: Normocephalic and atraumatic.  ?Eyes:  ?   General:     ?   Right eye: No discharge.     ?   Left eye: No discharge.  ?Cardiovascular:  ?   Comments: Regular rate and rhythm.  S1/S2 are distinct without any evidence of murmur, rubs, or gallops.  Radial pulses are 2+ bilaterally.  Dorsalis pedis pulses are 2+ bilaterally.  No evidence of pedal edema. ?Pulmonary:  ?   Comments: Clear to auscultation bilaterally.  Normal effort.  No respiratory distress.  No evidence of wheezes, rales, or rhonchi heard throughout. ?Abdominal:  ?   General: Abdomen is flat. Bowel sounds are normal. There is no distension.  ?   Tenderness: There is abdominal tenderness in the left lower quadrant. There is left CVA tenderness. There is no guarding or rebound.  ?Musculoskeletal:     ?   General: Normal range of motion.  ?   Cervical back: Neck supple.  ?Skin: ?   General: Skin is warm and dry.  ?   Findings: No rash.  ?Neurological:  ?   General: No focal deficit present.  ?   Mental Status: She is alert.  ?Psychiatric:     ?   Mood and Affect: Mood normal.     ?   Behavior: Behavior normal.  ? ? ?ED Results / Procedures / Treatments   ?Labs ?(all labs ordered are listed, but only abnormal results are displayed) ?Labs Reviewed  ?URINALYSIS, ROUTINE W REFLEX MICROSCOPIC - Abnormal; Notable for the following components:  ?    Result Value  ? Hgb urine dipstick SMALL (*)   ? Leukocytes,Ua SMALL (*)   ? All other components within normal limits  ?URINALYSIS, MICROSCOPIC (REFLEX) - Abnormal; Notable for the following components:  ? Bacteria, UA MANY (*)   ? All other components within normal limits  ?BASIC METABOLIC PANEL - Abnormal; Notable for the following components:  ? Chloride 113 (*)   ? Glucose, Bld 101 (*)   ? Calcium 8.4 (*)   ? Anion gap 4 (*)   ? All other  components within normal limits  ?CBC WITH DIFFERENTIAL/PLATELET  ? ? ?EKG ?None ? ?Radiology ?CT Renal Stone Study ? ?Result Date: 02/10/2022 ?CLINICAL DATA:  Left flank pain EXAM: CT ABDOMEN AND PELVIS WITHOUT

## 2022-02-11 ENCOUNTER — Ambulatory Visit (HOSPITAL_COMMUNITY): Payer: Medicare Other

## 2022-02-11 ENCOUNTER — Ambulatory Visit (HOSPITAL_COMMUNITY): Payer: Medicare Other | Admitting: Anesthesiology

## 2022-02-11 ENCOUNTER — Other Ambulatory Visit: Payer: Self-pay | Admitting: Urology

## 2022-02-11 ENCOUNTER — Other Ambulatory Visit: Payer: Self-pay

## 2022-02-11 ENCOUNTER — Encounter (HOSPITAL_COMMUNITY)
Admission: RE | Disposition: A | Discharge status: Home / Self Care | Payer: Self-pay | Source: Ambulatory Visit | Attending: Urology

## 2022-02-11 ENCOUNTER — Ambulatory Visit (HOSPITAL_BASED_OUTPATIENT_CLINIC_OR_DEPARTMENT_OTHER): Payer: Medicare Other | Admitting: Anesthesiology

## 2022-02-11 ENCOUNTER — Encounter (HOSPITAL_COMMUNITY): Payer: Self-pay | Admitting: Urology

## 2022-02-11 ENCOUNTER — Ambulatory Visit (HOSPITAL_COMMUNITY)
Admission: RE | Admit: 2022-02-11 | Discharge: 2022-02-11 | Disposition: A | Discharge status: Home / Self Care | Payer: Medicare Other | Source: Ambulatory Visit | Attending: Urology | Admitting: Urology

## 2022-02-11 DIAGNOSIS — Z79899 Other long term (current) drug therapy: Secondary | ICD-10-CM | POA: Diagnosis not present

## 2022-02-11 DIAGNOSIS — F32A Depression, unspecified: Secondary | ICD-10-CM | POA: Diagnosis not present

## 2022-02-11 DIAGNOSIS — N200 Calculus of kidney: Secondary | ICD-10-CM

## 2022-02-11 DIAGNOSIS — E039 Hypothyroidism, unspecified: Secondary | ICD-10-CM | POA: Diagnosis not present

## 2022-02-11 DIAGNOSIS — Z79890 Hormone replacement therapy (postmenopausal): Secondary | ICD-10-CM | POA: Diagnosis not present

## 2022-02-11 DIAGNOSIS — N132 Hydronephrosis with renal and ureteral calculous obstruction: Secondary | ICD-10-CM | POA: Insufficient documentation

## 2022-02-11 DIAGNOSIS — Z9884 Bariatric surgery status: Secondary | ICD-10-CM | POA: Diagnosis not present

## 2022-02-11 DIAGNOSIS — N201 Calculus of ureter: Secondary | ICD-10-CM

## 2022-02-11 DIAGNOSIS — K219 Gastro-esophageal reflux disease without esophagitis: Secondary | ICD-10-CM | POA: Diagnosis not present

## 2022-02-11 DIAGNOSIS — F419 Anxiety disorder, unspecified: Secondary | ICD-10-CM | POA: Insufficient documentation

## 2022-02-11 HISTORY — PX: CYSTOSCOPY WITH RETROGRADE PYELOGRAM, URETEROSCOPY AND STENT PLACEMENT: SHX5789

## 2022-02-11 SURGERY — CYSTOURETEROSCOPY, WITH RETROGRADE PYELOGRAM AND STENT INSERTION
Anesthesia: General | Site: Renal | Laterality: Left

## 2022-02-11 MED ORDER — FENTANYL CITRATE PF 50 MCG/ML IJ SOSY: 25.0000 ug | PREFILLED_SYRINGE | INTRAMUSCULAR | Status: DC | PRN

## 2022-02-11 MED ORDER — PROPOFOL 10 MG/ML IV BOLUS
INTRAVENOUS | Status: DC | PRN
  Administered 2022-02-11: 150 mg via INTRAVENOUS

## 2022-02-11 MED ORDER — FENTANYL CITRATE (PF) 100 MCG/2ML IJ SOLN
INTRAMUSCULAR | Status: DC | PRN
  Administered 2022-02-11 (×2): 50 ug via INTRAVENOUS

## 2022-02-11 MED ORDER — DEXAMETHASONE SODIUM PHOSPHATE 10 MG/ML IJ SOLN
INTRAMUSCULAR | Status: AC
  Filled 2022-02-11: qty 1

## 2022-02-11 MED ORDER — 0.9 % SODIUM CHLORIDE (POUR BTL) OPTIME
TOPICAL | Status: DC | PRN
  Administered 2022-02-11: 1000 mL

## 2022-02-11 MED ORDER — LIDOCAINE 2% (20 MG/ML) 5 ML SYRINGE
INTRAMUSCULAR | Status: DC | PRN
  Administered 2022-02-11: 60 mg via INTRAVENOUS

## 2022-02-11 MED ORDER — HYDROCODONE-ACETAMINOPHEN 5-325 MG PO TABS: 2.0000 | ORAL_TABLET | Freq: Four times a day (QID) | ORAL | 0 refills | Status: DC

## 2022-02-11 MED ORDER — CEPHALEXIN 500 MG PO CAPS
500.0000 mg | ORAL_CAPSULE | Freq: Three times a day (TID) | ORAL | 0 refills | Status: AC
Start: 2022-02-11 — End: 2022-02-21

## 2022-02-11 MED ORDER — LACTATED RINGERS IV SOLN: INTRAVENOUS | Status: DC

## 2022-02-11 MED ORDER — SODIUM CHLORIDE 0.9 % IR SOLN
Status: DC | PRN
  Administered 2022-02-11: 3000 mL via INTRAVESICAL

## 2022-02-11 MED ORDER — MIDAZOLAM HCL 2 MG/2ML IJ SOLN
INTRAMUSCULAR | Status: DC | PRN
  Administered 2022-02-11: 2 mg via INTRAVENOUS

## 2022-02-11 MED ORDER — MIDAZOLAM HCL 2 MG/2ML IJ SOLN
INTRAMUSCULAR | Status: AC
  Filled 2022-02-11: qty 2

## 2022-02-11 MED ORDER — FENTANYL CITRATE (PF) 100 MCG/2ML IJ SOLN
INTRAMUSCULAR | Status: AC
  Filled 2022-02-11: qty 2

## 2022-02-11 MED ORDER — AMISULPRIDE (ANTIEMETIC) 5 MG/2ML IV SOLN: 10.0000 mg | Freq: Once | INTRAVENOUS | Status: DC | PRN

## 2022-02-11 MED ORDER — ACETAMINOPHEN 500 MG PO TABS
1000.0000 mg | ORAL_TABLET | Freq: Once | ORAL | Status: AC
  Administered 2022-02-11: 1000 mg via ORAL
  Filled 2022-02-11: qty 2

## 2022-02-11 MED ORDER — IOHEXOL 300 MG/ML  SOLN
INTRAMUSCULAR | Status: DC | PRN
  Administered 2022-02-11: 5 mL via URETHRAL

## 2022-02-11 MED ORDER — ONDANSETRON HCL 4 MG/2ML IJ SOLN
INTRAMUSCULAR | Status: DC | PRN
  Administered 2022-02-11: 4 mg via INTRAVENOUS

## 2022-02-11 MED ORDER — SCOPOLAMINE 1 MG/3DAYS TD PT72
1.0000 | MEDICATED_PATCH | TRANSDERMAL | Status: DC
  Administered 2022-02-11: 1.5 mg via TRANSDERMAL
  Filled 2022-02-11: qty 1

## 2022-02-11 MED ORDER — EPHEDRINE SULFATE-NACL 50-0.9 MG/10ML-% IV SOSY
PREFILLED_SYRINGE | INTRAVENOUS | Status: DC | PRN
  Administered 2022-02-11: 10 mg via INTRAVENOUS
  Administered 2022-02-11: 5 mg via INTRAVENOUS
  Administered 2022-02-11: 10 mg via INTRAVENOUS

## 2022-02-11 MED ORDER — CEFAZOLIN SODIUM-DEXTROSE 2-4 GM/100ML-% IV SOLN
2.0000 g | INTRAVENOUS | Status: AC
  Administered 2022-02-11: 2 g via INTRAVENOUS
  Filled 2022-02-11: qty 100

## 2022-02-11 MED ORDER — LACTATED RINGERS IV SOLN
INTRAVENOUS | Status: DC
Start: 2022-02-11 — End: 2022-02-11

## 2022-02-11 MED ORDER — PHENYLEPHRINE 40 MCG/ML (10ML) SYRINGE FOR IV PUSH (FOR BLOOD PRESSURE SUPPORT)
PREFILLED_SYRINGE | INTRAVENOUS | Status: AC
  Filled 2022-02-11: qty 10

## 2022-02-11 MED ORDER — CHLORHEXIDINE GLUCONATE 0.12 % MT SOLN
15.0000 mL | OROMUCOSAL | Status: AC
  Administered 2022-02-11: 15 mL via OROMUCOSAL

## 2022-02-11 MED ORDER — EPHEDRINE 5 MG/ML INJ
INTRAVENOUS | Status: AC
  Filled 2022-02-11: qty 5

## 2022-02-11 MED ORDER — PROPOFOL 10 MG/ML IV BOLUS
INTRAVENOUS | Status: AC
  Filled 2022-02-11: qty 20

## 2022-02-11 MED ORDER — ONDANSETRON HCL 4 MG/2ML IJ SOLN
INTRAMUSCULAR | Status: AC
  Filled 2022-02-11: qty 2

## 2022-02-11 MED ORDER — PHENYLEPHRINE 40 MCG/ML (10ML) SYRINGE FOR IV PUSH (FOR BLOOD PRESSURE SUPPORT)
PREFILLED_SYRINGE | INTRAVENOUS | Status: DC | PRN
  Administered 2022-02-11: 40 ug via INTRAVENOUS
  Administered 2022-02-11: 80 ug via INTRAVENOUS
  Administered 2022-02-11 (×4): 40 ug via INTRAVENOUS

## 2022-02-11 SURGICAL SUPPLY — 29 items
BAG URO CATCHER STRL LF (MISCELLANEOUS) ×2 IMPLANT
BASKET STONE NCOMPASS (UROLOGICAL SUPPLIES) IMPLANT
CATH URETERAL DUAL LUMEN 10F (MISCELLANEOUS) IMPLANT
CATH URETL OPEN 5X70 (CATHETERS) ×1 IMPLANT
CLOTH BEACON ORANGE TIMEOUT ST (SAFETY) ×2 IMPLANT
EXTRACTOR STONE NITINOL NGAGE (UROLOGICAL SUPPLIES) ×1 IMPLANT
GLOVE SURG POLYISO LF SZ6.5 (GLOVE) ×1 IMPLANT
GLOVE SURG POLYISO LF SZ8 (GLOVE) ×2 IMPLANT
GLOVE SURG UNDER POLY LF SZ6.5 (GLOVE) ×1 IMPLANT
GOWN STRL REUS W/ TWL LRG LVL3 (GOWN DISPOSABLE) IMPLANT
GOWN STRL REUS W/ TWL XL LVL3 (GOWN DISPOSABLE) ×1 IMPLANT
GOWN STRL REUS W/TWL LRG LVL3 (GOWN DISPOSABLE) ×1
GOWN STRL REUS W/TWL XL LVL3 (GOWN DISPOSABLE) ×1
GUIDEWIRE STR DUAL SENSOR (WIRE) ×2 IMPLANT
IV NS IRRIG 3000ML ARTHROMATIC (IV SOLUTION) ×2 IMPLANT
KIT TURNOVER KIT A (KITS) ×1 IMPLANT
LASER FIB FLEXIVA PULSE ID 365 (Laser) IMPLANT
LASER FIB FLEXIVA PULSE ID 550 (Laser) IMPLANT
LASER FIB FLEXIVA PULSE ID 910 (Laser) IMPLANT
MANIFOLD NEPTUNE II (INSTRUMENTS) ×2 IMPLANT
NS IRRIG 1000ML POUR BTL (IV SOLUTION) ×1 IMPLANT
PACK CYSTO (CUSTOM PROCEDURE TRAY) ×2 IMPLANT
SHEATH NAVIGATOR HD 11/13X28 (SHEATH) ×1 IMPLANT
SHEATH NAVIGATOR HD 11/13X36 (SHEATH) IMPLANT
STENT URET 6FRX22 CONTOUR (STENTS) ×1 IMPLANT
TRACTIP FLEXIVA PULS ID 200XHI (Laser) IMPLANT
TRACTIP FLEXIVA PULSE ID 200 (Laser)
TUBING CONNECTING 10 (TUBING) ×2 IMPLANT
TUBING UROLOGY SET (TUBING) ×2 IMPLANT

## 2022-02-11 NOTE — Anesthesia Postprocedure Evaluation (Signed)
Anesthesia Post Note ? ?Patient: Morgan [name] ? ?Procedure(s) Performed: CYSTOSCOPY WITH RETROGRADE PYELOGRAM, URETEROSCOPY AND STENT PLACEMENT (Left: Renal) ? ?  ? ?Patient location during evaluation: PACU ?Anesthesia Type: General ?Level of consciousness: sedated ?Pain management: pain level controlled ?Vital Signs Assessment: post-procedure vital signs reviewed and stable ?Respiratory status: spontaneous breathing and respiratory function stable ?Cardiovascular status: stable ?Postop Assessment: no apparent nausea or vomiting ?Anesthetic complications: no ? ? ?No notable events documented. ? ?Last Vitals:  ?Vitals:  ? 02/11/22 1745 02/11/22 1815  ?BP: (!) 102/53   ?Pulse: 78 79  ?Resp: 10 12  ?Temp:    ?SpO2: 99% 98%  ?  ?Last Pain:  ?Vitals:  ? 02/11/22 1815  ?TempSrc:   ?PainSc: 2   ? ? ?  ?  ?  ?  ?  ?  ? ?Jashae Wiggs DANIEL ? ? ? ? ?

## 2022-02-11 NOTE — Discharge Instructions (Addendum)
You may see some blood in the urine and may have some burning with urination for 48-72 hours. You also may notice that you have to urinate more frequently or urgently after your procedure which is normal.  ?You should call should you develop an inability urinate, fever > 101, persistent nausea and vomiting that prevents you from eating or drinking to stay hydrated.  ?If you have a stent, you will likely urinate more frequently and urgently until the stent is removed and you may experience some discomfort/pain in the lower abdomen and flank especially when urinating. You may take pain medication prescribed to you if needed for pain. You may also intermittently have blood in the urine until the stent is removed. Your stent is on a string which is tucked into your vaginal canal. You may remove your stent by pulling on this string until the stent is removed on Friday (02/15/22). If you are uncomfortable doing this then please give the clinic a call.  ?If you have a catheter, you will be taught how to take care of the catheter by the nursing staff prior to discharge from the hospital.  You may periodically feel a strong urge to void with the catheter in place.  This is a bladder spasm and most often can occur when having a bowel movement or moving around. It is typically self-limited and usually will stop after a few minutes.  You may use some Vaseline or Neosporin around the tip of the catheter to reduce friction at the tip of the penis. You may also see some blood in the urine.  A very small amount of blood can make the urine look quite red.  As long as the catheter is draining well, there usually is not a problem.  However, if the catheter is not draining well and is bloody, you should call the office [phone]) to notify us.  ?You will follow up on April 10th in the Urology clinic. Please bring the your kidney stone to your follow up appointment for analysis.  ?

## 2022-02-11 NOTE — Transfer of Care (Signed)
Immediate Anesthesia Transfer of Care Note ? ?Patient: Morgan [name] ? ?Procedure(s) Performed: CYSTOSCOPY WITH RETROGRADE PYELOGRAM, URETEROSCOPY AND STENT PLACEMENT (Left: Renal) ? ?Patient Location: PACU ? ?Anesthesia Type:General ? ?Level of Consciousness: awake, alert  and patient cooperative ? ?Airway & Oxygen Therapy: Patient Spontanous Breathing and Patient connected to face mask oxygen ? ?Post-op Assessment: Report given to RN and Post -op Vital signs reviewed and stable ? ?Post vital signs: Reviewed and stable ? ?Last Vitals:  ?Vitals Value Taken Time  ?BP 113/56 02/11/22 1736  ?Temp    ?Pulse 82 02/11/22 1738  ?Resp 13 02/11/22 1738  ?SpO2 100 % 02/11/22 1738  ?Vitals shown include unvalidated device data. ? ?Last Pain:  ?Vitals:  ? 02/11/22 1522  ?TempSrc:   ?PainSc: 4   ?   ? ?Patients Stated Pain Goal: 3 (02/11/22 1522) ? ?Complications: No notable events documented. ?

## 2022-02-11 NOTE — Anesthesia Preprocedure Evaluation (Addendum)
Anesthesia Evaluation  ?Patient identified by MRN, date of birth, ID band ?Patient awake ? ? ? ?Reviewed: ?Allergy & Precautions, NPO status , Patient's Chart, lab work & pertinent test results ? ?History of Anesthesia Complications ?(+) PONV and history of anesthetic complications ? ?Airway ?Mallampati: II ? ?TM Distance: >3 FB ?Neck ROM: Full ? ? ? Dental ?no notable dental hx. ?(+) Dental Advisory Given ?  ?Pulmonary ?neg pulmonary ROS,  ?  ?Pulmonary exam normal ? ? ? ? ? ? ? Cardiovascular ?negative cardio ROS ?Normal cardiovascular exam ? ? ?  ?Neuro/Psych ?PSYCHIATRIC DISORDERS Anxiety Depression negative neurological ROS ?   ? GI/Hepatic ?Neg liver ROS, GERD  Medicated,  ?Endo/Other  ?Hypothyroidism  ? Renal/GU ?  ? ?  ?Musculoskeletal ?negative musculoskeletal ROS ?(+)  ? Abdominal ?  ?Peds ? Hematology ?negative hematology ROS ?(+)   ?Anesthesia Other Findings ? ? Reproductive/Obstetrics ? ?  ? ? ? ? ? ? ? ? ? ? ? ? ? ?  ?  ? ? ? ? ? ? ? ?Anesthesia Physical ?Anesthesia Plan ? ?ASA: 2 ? ?Anesthesia Plan: General  ? ?Post-op Pain Management: Celebrex PO (pre-op)* and Tylenol PO (pre-op)*  ? ?Induction: Intravenous ? ?PONV Risk Score and Plan: 4 or greater and Ondansetron, Dexamethasone, Midazolam and Scopolamine patch - Pre-op ? ?Airway Management Planned: LMA ? ?Additional Equipment:  ? ?Intra-op Plan:  ? ?Post-operative Plan: Extubation in OR ? ?Informed Consent: I have reviewed the patients History and Physical, chart, labs and discussed the procedure including the risks, benefits and alternatives for the proposed anesthesia with the patient or authorized representative who has indicated his/her understanding and acceptance.  ? ? ? ?Dental advisory given ? ?Plan Discussed with: Anesthesiologist and CRNA ? ?Anesthesia Plan Comments:   ? ? ? ? ? ?Anesthesia Quick Evaluation ? ?

## 2022-02-11 NOTE — Progress Notes (Signed)
I have ureteral stone.  ?HPI: Morgan [name] is a 51 year-old female patient who is here for ureteral stone. ? ?The problem is on the left side.  ? ?Morgan [name] is a 51 yo female who was seen in the ER on 3/26 for left flank pain and was found on CT to have 2 22mm stones in the upper distal ureter on the left with mild hydronephrosis. She is a former patient of the practice and was last seen in 2016 for stones. She has had prior ureteroscopy and her stone was mixed calcium oxalate in the past. She has a history of a gastric bypass. Her BMP, CBC and UA were unremarkable. She is on topomax since at least 2022. She has continues to have pain. She has had nausea without vomiting. She has urgency with hesitancy. She has had no fever. She didn't get relief with IM toradol or 2 hydrocodone.  ? ? ?  ?ALLERGIES: Clindamycin HCl CAPS ?NSAIDs ?Percocet TABS ?  ? ?MEDICATIONS: Omeprazole 40 mg capsule,delayed release 1 capsule PO Daily  ?Tamsulosin Hcl 0.4 mg capsule 1 capsule PO Daily  ?Aimovig Autoinjector 70 mg/ml auto-injector 70 mg IM Q1M  ?Amitiza 24 mcg capsule 2 capsule PO Daily  ?Botox inject q67month.  ?Bupropion Xl 300 mg tablet, extended release 24 hr 1 tablet PO Daily  ?Buspirone Hcl 30 mg tablet 1 tablet PO BID  ?Diazepam 5 mg tablet 1 tablet PO BID PRN  ?Flexeril 10 mg tablet 0 Oral  ?Fluticasone Propionate 50 mcg/actuation spray, suspension 1 ml Spray Daily PRN  ?Fluvoxamine Maleate 100 mg tablet 2 tablet PO Q HS  ?Hydrocodone-Acetaminophen 5 mg-325 mg tablet 2 tablet PO Q 6 H PRN  ?Hydroxyzine Hcl 25 mg tablet 1 tablet PO BID  ?Levothyroxine 112 mcg capsule 1 capsule PO Q AM  ?Linzess 290 mcg capsule 1 capsule PO Q AM  ?Lyrica 100 mg capsule 1 capsule PO Daily  ?Minipress 2 mg capsule 1 capsule PO Q HS  ?Multiple Vitamin 1 tablet PO Daily  ?Multivitamins tablet Oral  ?Neurontin 400 mg capsule 2 po in the morning and at lunch and 4 at bedtime  ?Ondansetron Odt 4 mg tablet,disintegrating 1 tablet PO Q 8 H PRN   ?Promethazine Hcl 12.5 mg tablet 1 tablet PO Q 6 H PRN  ?Quetiapine Fumarate 50 mg tablet 1 tablet PO Daily  ?Singulair 10 mg tablet 1 tablet PO Q HS  ?Topiramate 100 mg tablet 1 1/2 tablet PO BID  ?Trazodone Hcl 100 mg tablet 1 1/2 tablet PO Q HS  ?Zonisamide 50 mg capsule 1 capsule PO Daily  ?Zyprexa 20 mg tablet 1 tablet PO Q HS  ?  ? ?GU PSH: Cysto Uretero Lithotripsy - 2016, 2014 ?Cystoscopy Insert Stent - 2016 ?Hysterectomy ? ?  ?   ?PSH Notes: Cystoscopy With Ureteroscopy With Lithotripsy, Cystoscopy With Insertion Of Ureteral Stent Left, Gastric Surgery For Morbid Obesity Bypass With Roux-en-Y, Cystoscopy With Ureteroscopy With Lithotripsy  ? ?NON-GU PSH: Breast augmentation ?Cholecystectomy (laparoscopic) ?Gastric Bypass For Obesity - 2015 ? ?  ? ?GU PMH: Abdominal Pain Unspec, Right flank pain - 2016, Left flank pain, - 2016 ?Oth GU systems Signs/Symptoms, Bladder pain - 2016 ?Gross hematuria, Gross hematuria - 2016 ?Renal calculus, Nephrolithiasis - 2016 ?Ureteral calculus, Calculus of ureter - 2016 ?Other microscopic hematuria, Microscopic hematuria - 2016 ?Lower abdominal pain, unspecified, Lower abdominal pain - 2015 ?Overactive bladder, Overactive bladder - 2014 ?  ? ?NON-GU PMH: Encounter for general adult medical examination without abnormal  findings, Encounter for preventive health examination - 2016 ?Personal history of other endocrine, nutritional and metabolic disease, History of hypothyroidism - 2014 ?Personal history of other specified conditions, History of heartburn - 2014 ?Anxiety ?Depression ?GERD ?Hypothyroidism ?Migraine, unspecified, not intractable, without status migrainosus ?Personal history of traumatic brain injury ?  ? ?FAMILY HISTORY: Blood In Urine - Mother ?Cancer - Mother ?Death In The Family Mother - Mother ?Diabetes - Mother ?Family Health Status - Father alive at age 103 - Father ?Family Health Status Number - Runs In Family ?Heart Disease - Father ?Hypertension - Mother,  Father ?nephrolithiasis - Mother ?renal failure - Mother ?splenomegaly - Runs In Family  ? ?SOCIAL HISTORY: Marital Status: Divorced ?Preferred Language: English; Race: White ?Current Smoking Status: Patient has never smoked.  ? ?Tobacco Use Assessment Completed: Used Tobacco in last 30 days? ?Does not use smokeless tobacco. ?Has never drank.  ?Drinks 4+ caffeinated drinks per day. ?Patient's occupation is/was disabled. ?  ?  Notes: Never A Smoker, Marital History - Currently Married, Caffeine Use, Tobacco Use, Alcohol Use, Occupation:  ? ?REVIEW OF SYSTEMS:    ?GU Review Female:   Patient reports burning /pain with urination and leakage of urine. Patient denies frequent urination, hard to postpone urination, get up at night to urinate, stream starts and stops, trouble starting your stream, have to strain to urinate, and being pregnant.  ?Gastrointestinal (Upper):   Patient reports nausea and indigestion/ heartburn.   ?Gastrointestinal (Lower):   Patient reports constipation. Patient denies diarrhea.  ?Constitutional:   Patient denies fever, night sweats, weight loss, and fatigue.  ?Skin:   Patient denies skin rash/ lesion and itching.  ?Eyes:   Patient denies blurred vision and double vision.  ?Ears/ Nose/ Throat:   Patient denies sore throat and sinus problems.  ?Hematologic/Lymphatic:   Patient denies swollen glands and easy bruising.  ?Cardiovascular:   Patient denies leg swelling and chest pains.  ?Respiratory:   Patient denies cough and shortness of breath.  ?Endocrine:   Patient denies excessive thirst.  ?Musculoskeletal:   Patient denies joint pain.  ?Neurological:   Patient reports headaches and dizziness.   ?Psychologic:   Patient reports depression and anxiety.   ? ?Notes: blood in urine, painful intercourse ?  ? ?VITAL SIGNS:    ?  02/11/2022 09:06 AM 02/11/2022 09:03 AM  ?Weight   115 lb / 52.16 kg  ?Height   63 in / 160.02 cm  ?BP 90/57 mmHg 91/58 mmHg  ?Pulse 71 /min    ?Temperature   98.2 F / 36.7 C   ?BMI 20.4 kg/m?  ? ?MULTI-SYSTEM PHYSICAL EXAMINATION:    ?Constitutional: Thin. No physical deformities. Normally developed. Good grooming. sedated  ?Neck: Neck symmetrical, not swollen. Normal tracheal position.  ?Respiratory: Normal breath sounds. No labored breathing, no use of accessory muscles.   ?Cardiovascular: Regular rate and rhythm. No murmur, no gallop.   ?Skin: No paleness, no jaundice, no cyanosis. No lesion, no ulcer, no rash.  ?Neurologic / Psychiatric: Oriented to time, oriented to place, oriented to person. No depression, no anxiety, no agitation. somewhat medicated  ?Gastrointestinal: Abdominal tenderness, left flank. No mass, no rigidity, non obese abdomen.   ?Musculoskeletal: Normal gait and station of head and neck.  ? ?  ?Complexity of Data:  ?Lab Test Review:   BMP, CBC with Diff  ?Records Review:   Previous Hospital Records, Previous Patient Records  ?Urine Test Review:   Urinalysis  ?X-Ray Review: KUB: Reviewed Films. Discussed With Patient.  ?  C.TJoaquim Lai Protocol: Reviewed Films. Reviewed Report. Discussed With Patient.  ?  ? ?PROCEDURES:    ?     KUB - [id number]  ?A single view of the abdomen is obtained. I don't clearly see the distal stones but she has a lot of bowel gas present in the pelvis obscuring the anatomy. No other significant bone, gas or soft tissue shadows were noted.   ?  ?  ?Patient confirmed No Neulasta OnPro Device. ? ? ?  ? ? ?     Morphine $RemoveB'4mg'SYGUgyKK$  - [id number], [id number] ?Qty: 4 Adm. By: Jairo Ben Dezantil  ?Unit: mg Lot No [id number]  ?Route: IM Exp. Date 02/17/2023  ?Freq: None Mfgr.:   ?Site: Left Hip  ? ?ASSESSMENT:  ?    ICD-10 Details  ?1 GU:   Ureteral calculus - N20.1 Left, Acute, Systemic Symptoms - She has left distal ureteral stones with persistent pain. I discussed continued MET vs URS and she would like to proceed with URS. I have reviewed the risks of bleeding, infectioin, ureteral injury, need for a stent and secondary procedures, thrombotic events and anesthetic  complications.   ?2   Flank Pain - R10.84 Left, Acute, Systemic Symptoms  ? ?PLAN:    ? ?      Orders ?X-Rays: [facility]  ? ? ?      Schedule ?Return Visit/Planned Activity: ASAP - Schedule Surgery  ?Procedure: 02/11/2022 - Cys

## 2022-02-11 NOTE — H&P (View-Only) (Signed)
I have ureteral stone.  ?HPI: Morgan [name] is a 51 year-old female patient who is here for ureteral stone. ? ?The problem is on the left side.  ? ?Morgan [name] is a 51 yo female who was seen in the ER on 3/26 for left flank pain and was found on CT to have 2 73mm stones in the upper distal ureter on the left with mild hydronephrosis. She is a former patient of the practice and was last seen in 2016 for stones. She has had prior ureteroscopy and her stone was mixed calcium oxalate in the past. She has a history of a gastric bypass. Her BMP, CBC and UA were unremarkable. She is on topomax since at least 2022. She has continues to have pain. She has had nausea without vomiting. She has urgency with hesitancy. She has had no fever. She didn't get relief with IM toradol or 2 hydrocodone.  ? ? ?  ?ALLERGIES: Clindamycin HCl CAPS ?NSAIDs ?Percocet TABS ?  ? ?MEDICATIONS: Omeprazole 40 mg capsule,delayed release 1 capsule PO Daily  ?Tamsulosin Hcl 0.4 mg capsule 1 capsule PO Daily  ?Aimovig Autoinjector 70 mg/ml auto-injector 70 mg IM Q1M  ?Amitiza 24 mcg capsule 2 capsule PO Daily  ?Botox inject q69month.  ?Bupropion Xl 300 mg tablet, extended release 24 hr 1 tablet PO Daily  ?Buspirone Hcl 30 mg tablet 1 tablet PO BID  ?Diazepam 5 mg tablet 1 tablet PO BID PRN  ?Flexeril 10 mg tablet 0 Oral  ?Fluticasone Propionate 50 mcg/actuation spray, suspension 1 ml Spray Daily PRN  ?Fluvoxamine Maleate 100 mg tablet 2 tablet PO Q HS  ?Hydrocodone-Acetaminophen 5 mg-325 mg tablet 2 tablet PO Q 6 H PRN  ?Hydroxyzine Hcl 25 mg tablet 1 tablet PO BID  ?Levothyroxine 112 mcg capsule 1 capsule PO Q AM  ?Linzess 290 mcg capsule 1 capsule PO Q AM  ?Lyrica 100 mg capsule 1 capsule PO Daily  ?Minipress 2 mg capsule 1 capsule PO Q HS  ?Multiple Vitamin 1 tablet PO Daily  ?Multivitamins tablet Oral  ?Neurontin 400 mg capsule 2 po in the morning and at lunch and 4 at bedtime  ?Ondansetron Odt 4 mg tablet,disintegrating 1 tablet PO Q 8 H PRN   ?Promethazine Hcl 12.5 mg tablet 1 tablet PO Q 6 H PRN  ?Quetiapine Fumarate 50 mg tablet 1 tablet PO Daily  ?Singulair 10 mg tablet 1 tablet PO Q HS  ?Topiramate 100 mg tablet 1 1/2 tablet PO BID  ?Trazodone Hcl 100 mg tablet 1 1/2 tablet PO Q HS  ?Zonisamide 50 mg capsule 1 capsule PO Daily  ?Zyprexa 20 mg tablet 1 tablet PO Q HS  ?  ? ?GU PSH: Cysto Uretero Lithotripsy - 2016, 2014 ?Cystoscopy Insert Stent - 2016 ?Hysterectomy ? ?  ?   ?PSH Notes: Cystoscopy With Ureteroscopy With Lithotripsy, Cystoscopy With Insertion Of Ureteral Stent Left, Gastric Surgery For Morbid Obesity Bypass With Roux-en-Y, Cystoscopy With Ureteroscopy With Lithotripsy  ? ?NON-GU PSH: Breast augmentation ?Cholecystectomy (laparoscopic) ?Gastric Bypass For Obesity - 2015 ? ?  ? ?GU PMH: Abdominal Pain Unspec, Right flank pain - 2016, Left flank pain, - 2016 ?Oth GU systems Signs/Symptoms, Bladder pain - 2016 ?Gross hematuria, Gross hematuria - 2016 ?Renal calculus, Nephrolithiasis - 2016 ?Ureteral calculus, Calculus of ureter - 2016 ?Other microscopic hematuria, Microscopic hematuria - 2016 ?Lower abdominal pain, unspecified, Lower abdominal pain - 2015 ?Overactive bladder, Overactive bladder - 2014 ?  ? ?NON-GU PMH: Encounter for general adult medical examination without abnormal  findings, Encounter for preventive health examination - 2016 ?Personal history of other endocrine, nutritional and metabolic disease, History of hypothyroidism - 2014 ?Personal history of other specified conditions, History of heartburn - 2014 ?Anxiety ?Depression ?GERD ?Hypothyroidism ?Migraine, unspecified, not intractable, without status migrainosus ?Personal history of traumatic brain injury ?  ? ?FAMILY HISTORY: Blood In Urine - Mother ?Cancer - Mother ?Death In The Family Mother - Mother ?Diabetes - Mother ?Family Health Status - Father alive at age 49 - Father ?Family Health Status Number - Runs In Family ?Heart Disease - Father ?Hypertension - Mother,  Father ?nephrolithiasis - Mother ?renal failure - Mother ?splenomegaly - Runs In Family  ? ?SOCIAL HISTORY: Marital Status: Divorced ?Preferred Language: English; Race: White ?Current Smoking Status: Patient has never smoked.  ? ?Tobacco Use Assessment Completed: Used Tobacco in last 30 days? ?Does not use smokeless tobacco. ?Has never drank.  ?Drinks 4+ caffeinated drinks per day. ?Patient's occupation is/was disabled. ?  ?  Notes: Never A Smoker, Marital History - Currently Married, Caffeine Use, Tobacco Use, Alcohol Use, Occupation:  ? ?REVIEW OF SYSTEMS:    ?GU Review Female:   Patient reports burning /pain with urination and leakage of urine. Patient denies frequent urination, hard to postpone urination, get up at night to urinate, stream starts and stops, trouble starting your stream, have to strain to urinate, and being pregnant.  ?Gastrointestinal (Upper):   Patient reports nausea and indigestion/ heartburn.   ?Gastrointestinal (Lower):   Patient reports constipation. Patient denies diarrhea.  ?Constitutional:   Patient denies fever, night sweats, weight loss, and fatigue.  ?Skin:   Patient denies skin rash/ lesion and itching.  ?Eyes:   Patient denies blurred vision and double vision.  ?Ears/ Nose/ Throat:   Patient denies sore throat and sinus problems.  ?Hematologic/Lymphatic:   Patient denies swollen glands and easy bruising.  ?Cardiovascular:   Patient denies leg swelling and chest pains.  ?Respiratory:   Patient denies cough and shortness of breath.  ?Endocrine:   Patient denies excessive thirst.  ?Musculoskeletal:   Patient denies joint pain.  ?Neurological:   Patient reports headaches and dizziness.   ?Psychologic:   Patient reports depression and anxiety.   ? ?Notes: blood in urine, painful intercourse ?  ? ?VITAL SIGNS:    ?  02/11/2022 09:06 AM 02/11/2022 09:03 AM  ?Weight   115 lb / 52.16 kg  ?Height   63 in / 160.02 cm  ?BP 90/57 mmHg 91/58 mmHg  ?Pulse 71 /min    ?Temperature   98.2 F / 36.7 C   ?BMI 20.4 kg/m?  ? ?MULTI-SYSTEM PHYSICAL EXAMINATION:    ?Constitutional: Thin. No physical deformities. Normally developed. Good grooming. sedated  ?Neck: Neck symmetrical, not swollen. Normal tracheal position.  ?Respiratory: Normal breath sounds. No labored breathing, no use of accessory muscles.   ?Cardiovascular: Regular rate and rhythm. No murmur, no gallop.   ?Skin: No paleness, no jaundice, no cyanosis. No lesion, no ulcer, no rash.  ?Neurologic / Psychiatric: Oriented to time, oriented to place, oriented to person. No depression, no anxiety, no agitation. somewhat medicated  ?Gastrointestinal: Abdominal tenderness, left flank. No mass, no rigidity, non obese abdomen.   ?Musculoskeletal: Normal gait and station of head and neck.  ? ?  ?Complexity of Data:  ?Lab Test Review:   BMP, CBC with Diff  ?Records Review:   Previous Hospital Records, Previous Patient Records  ?Urine Test Review:   Urinalysis  ?X-Ray Review: KUB: Reviewed Films. Discussed With Patient.  ?  C.TJoaquim Lai Protocol: Reviewed Films. Reviewed Report. Discussed With Patient.  ?  ? ?PROCEDURES:    ?     KUB - 66060  ?A single view of the abdomen is obtained. I don't clearly see the distal stones but she has a lot of bowel gas present in the pelvis obscuring the anatomy. No other significant bone, gas or soft tissue shadows were noted.   ?  ?  ?Patient confirmed No Neulasta OnPro Device. ? ? ?  ? ? ?     Morphine $RemoveB'4mg'uLqoAjMj$  - [id number], [id number] ?Qty: 4 Adm. By: Jairo Ben Dezantil  ?Unit: mg Lot No [id number]  ?Route: IM Exp. Date 02/17/2023  ?Freq: None Mfgr.:   ?Site: Left Hip  ? ?ASSESSMENT:  ?    ICD-10 Details  ?1 GU:   Ureteral calculus - N20.1 Left, Acute, Systemic Symptoms - She has left distal ureteral stones with persistent pain. I discussed continued MET vs URS and she would like to proceed with URS. I have reviewed the risks of bleeding, infectioin, ureteral injury, need for a stent and secondary procedures, thrombotic events and anesthetic  complications.   ?2   Flank Pain - R10.84 Left, Acute, Systemic Symptoms  ? ?PLAN:    ? ?      Orders ?X-Rays: KUB  ? ? ?      Schedule ?Return Visit/Planned Activity: ASAP - Schedule Surgery  ?Procedure: 02/11/2022 - Cys

## 2022-02-11 NOTE — Op Note (Addendum)
Preoperative Diagnosis: Left nephrolithiasis ? ?Postoperative Diagnosis:  Same ? ?Procedure(s) Performed:   ?- Cystourethroscopy ?- Left ureteroscopic stone extraction without laser lithotripsy ?- Left retrograde pyelogram ?- Left ureteral stent placement ?- Intraoperative fluoroscopy with interpretation <1hr.  ? ?Teaching Surgeon:  Irine Seal, MD ? ?Resident Surgeon:  Reola Mosher, MD ? ?Assistant(s):  None ? ?Anesthesia:  General ? ?Fluids:  See anesthesia record ? ?Estimated blood loss:  0cc ? ?Specimens:  Stone for analysis ? ?Drains:  Left 6Fr x 22cm JJ ureteral stent with dangler ? ?Complications:  None ? ?Indications: 51 y.o. patient with a history of left ureteral stone. Risks & benefits of the procedure discussed with the patient, who wishes to proceed. ? ?Findings:   ?- Normal cystourethroscopy ?- Left retrograde pyelogram with moderate hydronephrosis down to the distal ureter with filling defect seen at the level of the pelvic brim ?- Left ureteroscopy with yellow/black jagged stone seen in the mid ureter. Successful basket extraction of the yellow stone in it's entirety.  ?- Successful left 6Fx22cm JJ ureteral stent placement with string ? ?Description:  The patient was correctly identified in the preop holding area where written informed consent as well potential risk and complication reviewed. The patient agreed. They were brought back to the operative suite where a preinduction timeout was performed. Once correct information was verified, general anesthesia was induced. They were then gently placed into dorsal lithotomy position with SCDs in place for VTE prophylaxis. They were prepped and draped in the usual sterile fashion and given appropriate preoperative antibiotics. A second timeout was then performed.  ? ?We inserted a 67F rigid cystoscope per urethra with copious lubrication and normal saline irrigation running. This demonstrated findings as described above.   ? ?We cannulated the left  ureteral orifice with a 5Fr open ended catheter. A retrograde pyelogram was performed with findings as noted above. We then placed a sensor wire into the left kidney and the 5Fr open ended catheter was removed. We then removed the cystoscope leaving our sensor wire in place. ? ?We then obtained a 11-13 Fr ureteral access sheath which was used to sequentially dilate the distal ureter under fluoroscopic guidance. The access sheath was removed and we obtained a semi-rigid ureteroscopy.Ureteroscopy was performed demonstrating the above findings.  ? ?We then basket extracted the stone using a zero-tip nitinol wire basket without complication. We then re-explored the location of stone treatment which demonstrated no residual stone.  At this point we elected to leave a ureteral stent and withdrew our instruments leaving a sensor wire in place.  We then advanced a 6Fr x 22 cm JJ ureteral stent with dangler with the assistance of a stent pusher under direct fluoroscopic & visual guidance without difficultly.  Sensor wire removal demonstrated satisfactory stent curl proximally in the renal pelvis and distally in the bladder. The bladder was emptied and all instrumentation was removed. The stent string was cut to an appropriate length and tucked into the vaginal canal. The patient was woken up from anesthesia and taken to the recovery unit for routine postoperative care. ? ? ?Post Op Plan:   ?1. Discharge after voiding spontaneously ?2. Continue antibiotics through time of stent pull ?3. Remove stent on Friday 3/31 ?4. Follow up with APP on 4/10 ? ?Attestation:  Dr. Jeffie Pollock was present and scrubbed for the entirety of the procedure.   ? ?Reola Mosher, MD ?[facility] Urology Resident, PGY4 ?[facility] ? ? ? ? ? ? ?

## 2022-02-11 NOTE — Interval H&P Note (Signed)
History and Physical Interval Note: She continues to have pain.  ? ?02/11/2022 ?4:41 PM ? ?Morgan [name]  has presented today for surgery, with the diagnosis of LEFT DISTAL STONE.  The various methods of treatment have been discussed with the patient and family. After consideration of risks, benefits and other options for treatment, the patient has consented to  Procedure(s): ?[name], URETEROSCOPY AND STENT PLACEMENT (Left) as a surgical intervention.  The patient's history has been reviewed, patient examined, no change in status, stable for surgery.  I have reviewed the patient's chart and labs.  Questions were answered to the patient's satisfaction.   ? ? ?Irine Seal ? ? ?

## 2022-02-11 NOTE — Anesthesia Procedure Notes (Signed)
Procedure Name: LMA Insertion ?Date/Time: 02/11/2022 4:58 PM ?Performed by: Eben Burow, CRNA ?Pre-anesthesia Checklist: Patient identified, Emergency Drugs available, Suction available, Patient being monitored and Timeout performed ?Patient Re-evaluated:Patient Re-evaluated prior to induction ?Oxygen Delivery Method: Circle system utilized ?Preoxygenation: Pre-oxygenation with 100% oxygen ?Induction Type: IV induction ?Ventilation: Mask ventilation without difficulty ?LMA: LMA inserted ?LMA Size: 4.0 ?Number of attempts: 1 ?Tube secured with: Tape ?Dental Injury: Teeth and Oropharynx as per pre-operative assessment  ? ? ? ? ?

## 2022-02-12 ENCOUNTER — Encounter (HOSPITAL_COMMUNITY): Payer: Self-pay | Admitting: Urology

## 2022-03-07 ENCOUNTER — Other Ambulatory Visit: Payer: Self-pay | Admitting: Physician Assistant

## 2022-03-08 ENCOUNTER — Other Ambulatory Visit: Payer: Self-pay | Admitting: Physician Assistant

## 2022-03-12 ENCOUNTER — Ambulatory Visit: Payer: Medicare Other | Admitting: Physician Assistant

## 2022-03-18 ENCOUNTER — Encounter: Payer: Self-pay | Admitting: Physician Assistant

## 2022-03-18 ENCOUNTER — Ambulatory Visit (INDEPENDENT_AMBULATORY_CARE_PROVIDER_SITE_OTHER): Payer: Medicare Other | Admitting: Physician Assistant

## 2022-03-18 VITALS — BP 89/55 | HR 86

## 2022-03-18 DIAGNOSIS — Z79899 Other long term (current) drug therapy: Secondary | ICD-10-CM

## 2022-03-18 DIAGNOSIS — F411 Generalized anxiety disorder: Secondary | ICD-10-CM

## 2022-03-18 DIAGNOSIS — F4310 Post-traumatic stress disorder, unspecified: Secondary | ICD-10-CM | POA: Diagnosis not present

## 2022-03-18 DIAGNOSIS — G4700 Insomnia, unspecified: Secondary | ICD-10-CM | POA: Diagnosis not present

## 2022-03-18 DIAGNOSIS — F319 Bipolar disorder, unspecified: Secondary | ICD-10-CM

## 2022-03-18 MED ORDER — DIAZEPAM 5 MG PO TABS: 5.0000 mg | ORAL_TABLET | Freq: Two times a day (BID) | ORAL | 2 refills | Status: DC | PRN

## 2022-03-18 MED ORDER — BUPROPION HCL ER (XL) 300 MG PO TB24: 300.0000 mg | ORAL_TABLET | Freq: Every day | ORAL | 3 refills | Status: DC

## 2022-03-18 NOTE — Progress Notes (Signed)
[facility] Check ? ?Patient ID: Morgan [name],  ?MRN: [id number] ? ?PCP: Maggie Schwalbe, PA-C ? ?Date of Evaluation: 03/18/2022  ?Time spent:30 minutes ? ?Chief Complaint:  ?Chief Complaint   ?Depression; Anxiety; Insomnia; Follow-up ?  ? ?HISTORY/CURRENT STATUS: ?HPI For routine med check. ? ?No longer getting Valium from surgeon. See the notes from Dr and PA.  Prior to the issues with her surgery needing the Valium and prescribed by them she was taking 5 mg twice daily as needed but usually routinely.  Unable to eat, because of the anxiety, was a size 6 and now size 2. "I don't like living here." A lot of anxiety in afternoon and at night. Afraid somebody will come in the house while her husband is at work. Usually able to keep herself busy during the day.  Husband works night shift. ? ?We increased Wellbutrin 6 weeks ago. It has helped her energy and motivation some.  Does not really enjoy much but again does not have anything to do here.  Isolates but not by choice.  Has step kids here but they do not have anything to do with her.  Her daughter lives on the other side of the state so she does not get to be with her much.  Does not cry easily.  ADLs and personal hygiene are normal.  No suicidal or homicidal thoughts. ? ?Patient denies increased energy with decreased need for sleep, no increased talkativeness, no racing thoughts, no impulsivity or risky behaviors, no increased spending, no increased libido, no grandiosity, no increased irritability or anger, no paranoia.  And no hallucinations. ? ?Denies dizziness, syncope, seizures, numbness, tingling, tremor, tics, unsteady gait, slurred speech, confusion.  Has chronic headaches, neck and back pain. Denies unexplained weight loss, frequent infections, or sores that heal slowly.  No polyphagia, polydipsia, or polyuria. Denies visual changes or paresthesias.  ? ?Individual Medical History/ Review of Systems: Changes? :Yes   kidney stone removed.   ? ?Past medications for mental health diagnoses include: ?Zoloft, trazodone, gabapentin, Risperdal, hydroxyzine, prazosin, nortriptyline, Adderall, Rexulti, Topamax, Latuda, Ingrezza lithium, Valium, clozapine, BuSpar, Zyprexa ? ?Allergies: Clindamycin/lincomycin, Nsaids, Oxycodone, and Percocet [oxycodone-acetaminophen] ? ?Current Medications:  ?Current Outpatient Medications:  ?  AMITIZA 24 MCG capsule, Take 24 mcg by mouth 2 (two) times daily with a meal. , Disp: , Rfl:  ?  busPIRone (BUSPAR) 30 MG tablet, Take 1 tablet (30 mg total) by mouth 2 (two) times daily., Disp: 180 tablet, Rfl: 1 ?  cyclobenzaprine (FLEXERIL) 10 MG tablet, Take 1 tablet (10 mg total) by mouth 3 (three) times daily as needed for muscle spasms., Disp: 270 tablet, Rfl: 0 ?  fluticasone (FLONASE) 50 MCG/ACT nasal spray, Place 1 spray into both nostrils daily as needed for allergies or rhinitis., Disp: , Rfl:  ?  fluvoxaMINE (LUVOX) 100 MG tablet, Take 2 tablets (200 mg total) by mouth at bedtime., Disp: 180 tablet, Rfl: 1 ?  gabapentin (NEURONTIN) 400 MG capsule, TAKE 2 CAPSULES BY MOUTH IN THE MORNING, 2 CAPSULES AT  LUNCH, AND 4 CAPSULES AT  BEDTIME, Disp: 720 capsule, Rfl: 1 ?  HYDROcodone-acetaminophen (NORCO/VICODIN) 5-325 MG tablet, Take 2 tablets by mouth every 6 (six) hours., Disp: 15 tablet, Rfl: 0 ?  hydrOXYzine (ATARAX/VISTARIL) 25 MG tablet, Take 1 tablet (25 mg total) by mouth 2 (two) times daily., Disp: 180 tablet, Rfl: 1 ?  levothyroxine (SYNTHROID, LEVOTHROID) 100 MCG tablet, Take 100 mcg by mouth daily before breakfast., Disp: , Rfl:  ?  linaclotide (LINZESS) 290 MCG CAPS capsule, Take 290 mcg by mouth daily before breakfast., Disp: , Rfl:  ?  LYRICA 100 MG capsule, , Disp: , Rfl:  ?  montelukast (SINGULAIR) 10 MG tablet, Take 10 mg by mouth at bedtime. , Disp: , Rfl:  ?  montelukast (SINGULAIR) 10 MG tablet, TAKE 1 TABLET BY MOUTH NIGHTLY, Disp: , Rfl:  ?  Multiple Vitamin (MULTI VITAMIN) TABS, Take 1 tablet by mouth  daily., Disp: 90 tablet, Rfl: 1 ?  OLANZapine (ZYPREXA) 20 MG tablet, TAKE 1 TABLET BY MOUTH AT  BEDTIME, Disp: 90 tablet, Rfl: 3 ?  omeprazole (PRILOSEC) 40 MG capsule, Take 40 mg by mouth daily. , Disp: , Rfl:  ?  OnabotulinumtoxinA (BOTOX IJ), Inject as directed every 3 (three) months., Disp: , Rfl:  ?  ondansetron (ZOFRAN-ODT) 4 MG disintegrating tablet, Take 4 mg by mouth every 8 (eight) hours as needed for nausea. , Disp: , Rfl:  ?  prazosin (MINIPRESS) 2 MG capsule, TAKE 1 CAPSULE BY MOUTH AT  BEDTIME, Disp: 90 capsule, Rfl: 3 ?  promethazine (PHENERGAN) 12.5 MG tablet, Take 12.5 mg by mouth every 6 (six) hours as needed for nausea or vomiting. , Disp: , Rfl:  ?  tamsulosin (FLOMAX) 0.4 MG CAPS capsule, Take 1 capsule (0.4 mg total) by mouth daily., Disp: 30 capsule, Rfl: 0 ?  topiramate (TOPAMAX) 100 MG tablet, TAKE 1 AND 1/2 TABLETS(150 MG) BY MOUTH TWICE DAILY, Disp: 45 tablet, Rfl: 0 ?  topiramate (TOPAMAX) 100 MG tablet, take one and one-half tablets by mouth twice a day, Disp: 270 tablet, Rfl: 1 ?  traMADol (ULTRAM) 50 MG tablet, Take 50 mg by mouth every 6 (six) hours as needed for moderate pain (kidney stones)., Disp: , Rfl:  ?  traZODone (DESYREL) 100 MG tablet, TAKE 1 AND 1/2 TABLETS BY MOUTH  AT BEDTIME AS NEEDED FOR SLEEP, Disp: 135 tablet, Rfl: 3 ?  AIMOVIG 70 MG/ML SOAJ, Inject 70 mg into the muscle every 30 (thirty) days.  (Patient not taking: Reported on 03/18/2022), Disp: , Rfl: 11 ?  buPROPion (WELLBUTRIN XL) 300 MG 24 hr tablet, Take 1 tablet (300 mg total) by mouth daily., Disp: 90 tablet, Rfl: 3 ?  diazepam (VALIUM) 5 MG tablet, Take 1 tablet (5 mg total) by mouth every 12 (twelve) hours as needed for anxiety., Disp: 60 tablet, Rfl: 2 ?  ipratropium-albuterol (DUONEB) 0.5-2.5 (3) MG/3ML SOLN, Inhale 3 mLs into the lungs every 6 (six) hours as needed (SOB, wheezing).  (Patient not taking: Reported on 11/01/2021), Disp: , Rfl:  ?  ketorolac (TORADOL) 10 MG tablet, TK 1 T PO  Q 6 H PRF P  (Patient not taking: Reported on 03/18/2022), Disp: , Rfl: 0 ?  levothyroxine (SYNTHROID) 88 MCG tablet, Take 112 mcg by mouth daily before breakfast. (Patient not taking: Reported on 03/18/2022), Disp: , Rfl:  ?  phenazopyridine (PYRIDIUM) 200 MG tablet, Take 1 tablet (200 mg total) by mouth 3 (three) times daily as needed for pain. (Patient not taking: Reported on 07/31/2021), Disp: 6 tablet, Rfl: 0 ?  predniSONE (DELTASONE) 10 MG tablet, Take 10 mg by mouth daily with breakfast.  (Patient not taking: Reported on 01/23/2021), Disp: , Rfl:  ?  QUEtiapine (SEROQUEL) 50 MG tablet, Take by mouth., Disp: , Rfl:  ?  zonisamide (ZONEGRAN) 50 MG capsule, Take by mouth., Disp: , Rfl:  ?Medication Side Effects: none ? ?Family Medical/ Social History: Changes?  No ? ? ?MENTAL HEALTH EXAM: ? ?  Blood pressure (!) 89/55, pulse 86.There is no height or weight on file to calculate BMI.  States BP is always low.  ?General Appearance: Casual, Neat and Well Groomed  ?Eye Contact:  Good  ?Speech:  Clear and Coherent and Normal Rate  ?Volume:  Normal  ?Mood:  Euthymic  ?Affect:  Congruent  ?Thought Process:  Goal Directed and Descriptions of Associations: Intact  ?Orientation:  Full (Time, Place, and Person)  ?Thought Content: Logical   ?Suicidal Thoughts:  No  ?Homicidal Thoughts:  No  ?Memory:  WNL  ?Judgement:  Good  ?Insight:  Good  ?Psychomotor Activity:  Normal  ?Concentration:  Concentration: Good and Attention Span: Good  ?Recall:  Good  ?Fund of Knowledge: Good  ?Language: Good  ?Assets:  Desire for Improvement  ?ADL's:  Intact  ?Cognition: WNL  ?Prognosis:  Good  ? ?03/13/2022 ?CBC with differential normal ?CMP glucose 101 otherwise normal ?See TSH, iron profile under labs. ? ?DIAGNOSES:  ?  ICD-10-CM   ?1. Generalized anxiety disorder  F41.1   ?  ?2. Bipolar depression ([place])  F31.9   ?  ?3. PTSD (post-traumatic stress disorder)  F43.10   ?  ?4. Insomnia, unspecified type  G47.00   ?  ?5. Polypharmacy  Z79.899   ?  ? ? ?Receiving  Psychotherapy: Yes   Jesse Sans ? ? ?RECOMMENDATIONS:  ?PDMP was reviewed. Valium filled 01/29/2022, hydrocodone and tramadol also recently. ?I provided 30 minutes of face to face time during this encounter, includ

## 2022-03-20 ENCOUNTER — Other Ambulatory Visit: Payer: Self-pay | Admitting: Physician Assistant

## 2022-04-21 ENCOUNTER — Other Ambulatory Visit: Payer: Self-pay

## 2022-04-21 ENCOUNTER — Emergency Department (HOSPITAL_BASED_OUTPATIENT_CLINIC_OR_DEPARTMENT_OTHER)
Admission: EM | Admit: 2022-04-21 | Discharge: 2022-04-21 | Disposition: A | Discharge status: Home / Self Care | Payer: Medicare Other | Attending: Emergency Medicine | Admitting: Emergency Medicine

## 2022-04-21 ENCOUNTER — Encounter (HOSPITAL_BASED_OUTPATIENT_CLINIC_OR_DEPARTMENT_OTHER): Payer: Self-pay | Admitting: *Deleted

## 2022-04-21 DIAGNOSIS — G8918 Other acute postprocedural pain: Secondary | ICD-10-CM | POA: Diagnosis not present

## 2022-04-21 DIAGNOSIS — K0889 Other specified disorders of teeth and supporting structures: Secondary | ICD-10-CM | POA: Insufficient documentation

## 2022-04-21 MED ORDER — HYDROCODONE-ACETAMINOPHEN 5-325 MG PO TABS: 2.0000 | ORAL_TABLET | Freq: Three times a day (TID) | ORAL | 0 refills | Status: DC | PRN

## 2022-04-21 MED ORDER — NAPROXEN 250 MG PO TABS
500.0000 mg | ORAL_TABLET | Freq: Once | ORAL | Status: AC
  Administered 2022-04-21: 500 mg via ORAL
  Filled 2022-04-21: qty 2

## 2022-04-21 MED ORDER — HYDROCODONE-ACETAMINOPHEN 5-325 MG PO TABS: 1.0000 | ORAL_TABLET | Freq: Three times a day (TID) | ORAL | 0 refills | Status: DC | PRN

## 2022-04-21 MED ORDER — NAPROXEN 500 MG PO TABS: 500.0000 mg | ORAL_TABLET | Freq: Two times a day (BID) | ORAL | 0 refills | Status: DC

## 2022-04-21 MED ORDER — PANTOPRAZOLE SODIUM 40 MG PO TBEC
40.0000 mg | DELAYED_RELEASE_TABLET | Freq: Every day | ORAL | Status: DC
  Administered 2022-04-21: 40 mg via ORAL
  Filled 2022-04-21: qty 1

## 2022-04-21 MED ORDER — HYDROCODONE-ACETAMINOPHEN 5-325 MG PO TABS
1.0000 | ORAL_TABLET | Freq: Once | ORAL | Status: AC
Start: 2022-04-21 — End: 2022-04-21
  Administered 2022-04-21: 1 via ORAL
  Filled 2022-04-21: qty 1

## 2022-04-21 NOTE — ED Provider Notes (Signed)
[place] EMERGENCY DEPARTMENT Provider Note   CSN: [id number] Arrival date & time: 04/21/22  0751     History  Chief Complaint  Patient presents with   Dental Pain    Morgan [name] is a 51 y.o. female.  HPI    51 year old female comes in with chief complaint of dental pain.  Patient indicates that she had wisdom tooth removed on Monday.  She started having excruciating pain after the procedure with biting or chewing.  She felt like something sharp was protruding over the back of her tooth.  She was seen again by the dentist on Tuesday.  They noted that she had some bone spurs that were sticking out and she underwent further instrumentation.  However, after the procedure she continues to have severe pain, and she has called the dentist on 2 separate occasions without any call back.  Patient has been taking Tylenol without any significant relief.  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  She denies any fevers, chills, nausea, vomiting.  She was put on antibiotics prior to the wisdom tooth extraction.   Home Medications Prior to Admission medications   Medication Sig Start Date End Date Taking? Authorizing Provider  HYDROcodone-acetaminophen (NORCO/VICODIN) 5-325 MG tablet Take 2 tablets by mouth every 8 (eight) hours as needed. 04/21/22  Yes Quin Mathenia, MD  naproxen (NAPROSYN) 500 MG tablet Take 1 tablet (500 mg total) by mouth 2 (two) times daily. 04/21/22  Yes Jaylend Reiland, MD  AIMOVIG 70 MG/ML SOAJ Inject 70 mg into the muscle every 30 (thirty) days.  Patient not taking: Reported on 03/18/2022 10/13/17   [provider]  AMITIZA 24 MCG capsule Take 24 mcg by mouth 2 (two) times daily with a meal.  09/24/17   [provider]  buPROPion (WELLBUTRIN XL) 300 MG 24 hr tablet Take 1 tablet (300 mg total) by mouth daily. 03/18/22   Donnal Moat T, PA-C  busPIRone (BUSPAR) 30 MG tablet Take 1 tablet (30 mg total) by mouth 2 (two) times  daily. 01/30/22   Donnal Moat T, PA-C  cyclobenzaprine (FLEXERIL) 10 MG tablet Take 1 tablet (10 mg total) by mouth 3 (three) times daily as needed for muscle spasms. 11/04/18   Shugart, Lissa Hoard, PA-C  diazepam (VALIUM) 5 MG tablet Take 1 tablet (5 mg total) by mouth every 12 (twelve) hours as needed for anxiety. 03/18/22   Donnal Moat T, PA-C  fluticasone (FLONASE) 50 MCG/ACT nasal spray Place 1 spray into both nostrils daily as needed for allergies or rhinitis.    [provider]  fluvoxaMINE (LUVOX) 100 MG tablet Take 2 tablets (200 mg total) by mouth at bedtime. 01/30/22   Donnal Moat T, PA-C  gabapentin (NEURONTIN) 400 MG capsule TAKE 2 CAPSULES BY MOUTH IN THE MORNING, 2 CAPSULES AT  LUNCH, AND 4 CAPSULES AT  BEDTIME 01/30/22   Hurst, Helene Kelp T, PA-C  hydrOXYzine (ATARAX/VISTARIL) 25 MG tablet Take 1 tablet (25 mg total) by mouth 2 (two) times daily. 07/27/19   Mozingo, Berdie Ogren, NP  ipratropium-albuterol (DUONEB) 0.5-2.5 (3) MG/3ML SOLN Inhale 3 mLs into the lungs every 6 (six) hours as needed (SOB, wheezing).  Patient not taking: Reported on 11/01/2021    [provider]  levothyroxine (SYNTHROID) 88 MCG tablet Take 112 mcg by mouth daily before breakfast. Patient not taking: Reported on 03/18/2022    [provider]  levothyroxine (SYNTHROID, LEVOTHROID) 100 MCG tablet Take 100 mcg by mouth daily before breakfast. 09/09/17  [provider]  linaclotide Rolan Lipa) 290 MCG CAPS capsule Take 290 mcg by mouth daily before breakfast.    [provider]  LYRICA 100 MG capsule  10/23/17   [provider]  montelukast (SINGULAIR) 10 MG tablet Take 10 mg by mouth at bedtime.  02/05/16   [provider]  montelukast (SINGULAIR) 10 MG tablet TAKE 1 TABLET BY MOUTH NIGHTLY 02/05/16   [provider]  Multiple Vitamin (MULTI VITAMIN) TABS Take 1 tablet by mouth daily. 09/23/19   Donnal Moat T, PA-C  OLANZapine (ZYPREXA) 20 MG tablet  TAKE 1 TABLET BY MOUTH AT  BEDTIME 03/08/22   Hurst, Helene Kelp T, PA-C  omeprazole (PRILOSEC) 40 MG capsule Take 40 mg by mouth daily.  07/29/17   [provider]  OnabotulinumtoxinA (BOTOX IJ) Inject as directed every 3 (three) months.    [provider]  ondansetron (ZOFRAN-ODT) 4 MG disintegrating tablet Take 4 mg by mouth every 8 (eight) hours as needed for nausea.  02/05/16   [provider]  phenazopyridine (PYRIDIUM) 200 MG tablet Take 1 tablet (200 mg total) by mouth 3 (three) times daily as needed for pain. Patient not taking: Reported on 07/31/2021 02/13/21   Palumbo, April, MD  prazosin (MINIPRESS) 2 MG capsule TAKE 1 CAPSULE BY MOUTH AT  BEDTIME 11/20/21   Hurst, Teresa T, PA-C  predniSONE (DELTASONE) 10 MG tablet Take 10 mg by mouth daily with breakfast.  Patient not taking: Reported on 01/23/2021 10/23/17   [provider]  promethazine (PHENERGAN) 12.5 MG tablet Take 12.5 mg by mouth every 6 (six) hours as needed for nausea or vomiting.  10/29/17   [provider]  QUEtiapine (SEROQUEL) 50 MG tablet Take by mouth. 10/18/21 11/17/21  [provider]  tamsulosin (FLOMAX) 0.4 MG CAPS capsule Take 1 capsule (0.4 mg total) by mouth daily. 02/10/22   Myna Bright M, PA-C  topiramate (TOPAMAX) 100 MG tablet TAKE 1 AND 1/2 TABLETS(150 MG) BY MOUTH TWICE DAILY 07/24/21   Donnal Moat T, PA-C  topiramate (TOPAMAX) 100 MG tablet take one and one-half tablets by mouth twice a day 01/30/22   Donnal Moat T, PA-C  traMADol (ULTRAM) 50 MG tablet Take 50 mg by mouth every 6 (six) hours as needed for moderate pain (kidney stones).    [provider]  traZODone (DESYREL) 100 MG tablet TAKE 1 AND 1/2 TABLETS BY MOUTH  AT BEDTIME AS NEEDED FOR SLEEP 12/21/21   Donnal Moat T, PA-C  zonisamide (ZONEGRAN) 50 MG capsule Take by mouth. 02/18/20 02/17/21  [provider]      Allergies    Clindamycin/lincomycin, Nsaids, Oxycodone, and Percocet  [oxycodone-acetaminophen]    Review of Systems   Review of Systems  All other systems reviewed and are negative.  Physical Exam Updated Vital Signs BP 112/70 (BP Location: Right Arm)   Pulse 70   Temp 97.9 F (36.6 C) (Oral)   Resp 18   Ht '5\' 3"'$  (1.6 m)   Wt 53 kg   SpO2 100%   BMI 20.70 kg/m  Physical Exam Vitals and nursing note reviewed.  Constitutional:      Appearance: She is well-developed.  HENT:     Head: Atraumatic.     Mouth/Throat:     Comments: Pt has no trismus, stridor or crepitus over the neck.  She has tenderness just posterior to the submandibular region and over the posterior jaw.  No cervical lymphadenopathy. There is clear evidence of instrumentation over her  left molar area with wisdom teeth extraction.  There is no evidence of any gingival swelling or pus.  There is granulation tissue over the surgical site. Cardiovascular:     Rate and Rhythm: Normal rate.  Pulmonary:     Effort: Pulmonary effort is normal.  Musculoskeletal:     Cervical back: Normal range of motion and neck supple.  Skin:    General: Skin is warm and dry.  Neurological:     Mental Status: She is alert and oriented to person, place, and time.    ED Results / Procedures / Treatments   Labs (all labs ordered are listed, but only abnormal results are displayed) Labs Reviewed - No data to display  EKG None  Radiology No results found.  Procedures Procedures    Medications Ordered in ED Medications  HYDROcodone-acetaminophen (NORCO/VICODIN) 5-325 MG per tablet 1 tablet (has no administration in time range)  pantoprazole (PROTONIX) EC tablet 40 mg (40 mg Oral Given 04/21/22 0833)  naproxen (NAPROSYN) tablet 500 mg (500 mg Oral Given 04/21/22 9892)    ED Course/ Medical Decision Making/ A&P                           Medical Decision Making Risk Prescription drug management.   51 year old female comes in with chief complaint of toothache.  She recently had a wisdom  tooth extraction.  Subsequently, she required more instrumentation for pain.  Her pain has persisted.  On exam, no clear evidence of infection.  My suspicion is that most likely she is having significant inflammation at the surgical site and having some neuropathic pain from it.  Patient does not have any red flag suggesting deep space infection, CT scan or x-ray of the neck is not indicated at this time.  Patient is not toxic appearing which is also reassuring and she is immunocompetent.  No red flags for airway compromise.  At this time, we will focus on pain control.  Patient advised to follow-up with dentist again tomorrow.  Ice and 2 days of anti-inflammatory medication prescribed along with 2 days of Vicodin.  Final Clinical Impression(s) / ED Diagnoses Final diagnoses:  Dentalgia  Acute post-operative pain    Rx / DC Orders ED Discharge Orders          Ordered    HYDROcodone-acetaminophen (NORCO/VICODIN) 5-325 MG tablet  Every 8 hours PRN        04/21/22 0832    naproxen (NAPROSYN) 500 MG tablet  2 times daily        04/21/22 1194              Varney Biles, MD 04/21/22 0845

## 2022-04-21 NOTE — Discharge Instructions (Addendum)
Please apply ice to the jaw area 4 times a day for 10 to 15 minutes each. Take anti-inflammatory medication as prescribed along with omeprazole or Prilosec to protect your stomach lining.  Take the anti-inflammatory medication only for the next 2 days.  Call the dentist tomorrow for follow-up appointment.  Return to the ER if you start having increased swelling to the left side of your neck, increased pain, difficulty in swallowing, difficulty in breathing, drooling.

## 2022-04-21 NOTE — ED Triage Notes (Signed)
Tues had wisdom tooth extraction. Returned back to Dentist on wed due to increase pain, had additional work done. Now has increased pain at site, attempted several times to contact dentist and has not rec returned phone call.

## 2022-06-11 ENCOUNTER — Other Ambulatory Visit: Payer: Self-pay | Admitting: Physician Assistant

## 2022-06-12 ENCOUNTER — Other Ambulatory Visit: Payer: Self-pay | Admitting: Physician Assistant

## 2022-06-12 MED ORDER — GABAPENTIN 800 MG PO TABS: ORAL_TABLET | ORAL | 0 refills | Status: DC

## 2022-06-12 NOTE — Telephone Encounter (Signed)
Please review

## 2022-06-19 ENCOUNTER — Other Ambulatory Visit: Payer: Self-pay | Admitting: Physician Assistant

## 2022-06-19 ENCOUNTER — Encounter: Payer: Self-pay | Admitting: Physician Assistant

## 2022-06-19 ENCOUNTER — Ambulatory Visit (INDEPENDENT_AMBULATORY_CARE_PROVIDER_SITE_OTHER): Payer: Medicare Other | Admitting: Physician Assistant

## 2022-06-19 DIAGNOSIS — G4700 Insomnia, unspecified: Secondary | ICD-10-CM

## 2022-06-19 DIAGNOSIS — Z79899 Other long term (current) drug therapy: Secondary | ICD-10-CM

## 2022-06-19 DIAGNOSIS — F319 Bipolar disorder, unspecified: Secondary | ICD-10-CM | POA: Diagnosis not present

## 2022-06-19 DIAGNOSIS — F411 Generalized anxiety disorder: Secondary | ICD-10-CM | POA: Diagnosis not present

## 2022-06-19 DIAGNOSIS — F4310 Post-traumatic stress disorder, unspecified: Secondary | ICD-10-CM

## 2022-06-19 DIAGNOSIS — I959 Hypotension, unspecified: Secondary | ICD-10-CM

## 2022-06-19 MED ORDER — FLUVOXAMINE MALEATE 100 MG PO TABS
200.0000 mg | ORAL_TABLET | Freq: Every day | ORAL | 1 refills | Status: DC
Start: 2022-06-19 — End: 2022-09-24

## 2022-06-19 MED ORDER — PRAZOSIN HCL 1 MG PO CAPS: 1.0000 mg | ORAL_CAPSULE | Freq: Every day | ORAL | 1 refills | Status: DC

## 2022-06-19 MED ORDER — BUSPIRONE HCL 30 MG PO TABS: 30.0000 mg | ORAL_TABLET | Freq: Two times a day (BID) | ORAL | 1 refills | Status: DC

## 2022-06-19 MED ORDER — BUPROPION HCL ER (XL) 300 MG PO TB24: 300.0000 mg | ORAL_TABLET | Freq: Every day | ORAL | 1 refills | Status: DC

## 2022-06-19 MED ORDER — TRAZODONE HCL 100 MG PO TABS: 100.0000 mg | ORAL_TABLET | Freq: Every evening | ORAL | 1 refills | Status: DC | PRN

## 2022-06-19 NOTE — Progress Notes (Signed)
[facility] Med Check  Patient ID: Morgan [name],  MRN: [id number]  PCP: Maggie Schwalbe, PA-C  Date of Evaluation: 06/19/2022  Time spent:30 minutes  Chief Complaint:  Chief Complaint   Anxiety; Depression; Insomnia; Follow-up    Virtual Visit via Telehealth  I connected with patient by telephone, with their informed consent, and verified patient privacy and that I am speaking with the correct person using two identifiers.  I am private, in my office and the patient is at home.  I discussed the limitations, risks, security and privacy concerns of performing an evaluation and management service by telephone and the availability of in person appointments. I also discussed with the patient that there may be a patient responsible charge related to this service. The patient expressed understanding and agreed to proceed.   I discussed the assessment and treatment plan with the patient. The patient was provided an opportunity to ask questions and all were answered. The patient agreed with the plan and demonstrated an understanding of the instructions.   The patient was advised to call back or seek an in-person evaluation if the symptoms worsen or if the condition fails to improve as anticipated.  I provided 30 minutes of non-face-to-face time during this encounter.  HISTORY/CURRENT STATUS: HPI For routine med check.  States her BP is low, "the last time I went to the dr and it took 4 people to get my blood pressure."  Says it was around 80/50 or something like that.  She has felt dizzy but has not passed out.  She is on prazosin for nightmares which has been beneficial.  Is doing well as far as her mental health goes. Patient is able to enjoy things.  Energy and motivation are good.  No extreme sadness, tearfulness, or feelings of hopelessness.  Sleeps well most of the time.  She does have to have trazodone.  ADLs and personal hygiene are normal.   Denies any changes in  concentration, making decisions, or remembering things.  Appetite has not changed.  Weight is stable.  She does get anxious at times and needs the Valium.  Does not take it routinely though.  Not having panic attacks but just gets overwhelmed sometimes.  When that happens, she has a sense of dread like something bad may happen at any time.  Denies suicidal or homicidal thoughts.  Patient denies increased energy with decreased need for sleep, increased talkativeness, racing thoughts, impulsivity or risky behaviors, increased spending, increased libido, grandiosity, increased irritability or anger, paranoia, and no hallucinations.  Denies dizziness, syncope, seizures, numbness, tingling, tremor, tics, unsteady gait, slurred speech, confusion.  Has chronic headaches, neck and back pain. Denies unexplained weight loss, frequent infections, or sores that heal slowly.  No polyphagia, polydipsia, or polyuria. Denies visual changes or paresthesias.   Individual Medical History/ Review of Systems: Changes? :Yes   Has a GI bug so couldn't come in for appt today.   Past medications for mental health diagnoses include: Zoloft, trazodone, gabapentin, Risperdal, hydroxyzine, prazosin, nortriptyline, Adderall, Rexulti, Topamax, Latuda, Ingrezza lithium, Valium, clozapine, BuSpar, Zyprexa  Allergies: Clindamycin/lincomycin, Nsaids, Oxycodone, and Percocet [oxycodone-acetaminophen]  Current Medications:  Current Outpatient Medications:    AIMOVIG 70 MG/ML SOAJ, Inject 70 mg into the muscle every 30 (thirty) days., Disp: , Rfl: 11   AMITIZA 24 MCG capsule, Take 24 mcg by mouth 2 (two) times daily with a meal. , Disp: , Rfl:    cyclobenzaprine (FLEXERIL) 10 MG tablet, Take 1 tablet (10 mg total)  by mouth 3 (three) times daily as needed for muscle spasms., Disp: 270 tablet, Rfl: 0   diazepam (VALIUM) 5 MG tablet, Take 1 tablet (5 mg total) by mouth every 12 (twelve) hours as needed for anxiety., Disp: 60 tablet, Rfl:  2   fluticasone (FLONASE) 50 MCG/ACT nasal spray, Place 1 spray into both nostrils daily as needed for allergies or rhinitis., Disp: , Rfl:    gabapentin (NEURONTIN) 800 MG tablet, 1 p.o. every morning, 1 p.o. at lunch, 2 p.o. nightly.  May crash and put in applesauce or pudding if unable to swallow tablets., Disp: 360 tablet, Rfl: 0   hydrOXYzine (ATARAX/VISTARIL) 25 MG tablet, Take 1 tablet (25 mg total) by mouth 2 (two) times daily., Disp: 180 tablet, Rfl: 1   levothyroxine (SYNTHROID, LEVOTHROID) 100 MCG tablet, Take 100 mcg by mouth daily before breakfast., Disp: , Rfl:    linaclotide (LINZESS) 290 MCG CAPS capsule, Take 290 mcg by mouth daily before breakfast., Disp: , Rfl:    LYRICA 100 MG capsule, , Disp: , Rfl:    montelukast (SINGULAIR) 10 MG tablet, Take 10 mg by mouth at bedtime. , Disp: , Rfl:    montelukast (SINGULAIR) 10 MG tablet, TAKE 1 TABLET BY MOUTH NIGHTLY, Disp: , Rfl:    Multiple Vitamin (MULTI VITAMIN) TABS, Take 1 tablet by mouth daily., Disp: 90 tablet, Rfl: 1   naproxen (NAPROSYN) 500 MG tablet, Take 1 tablet (500 mg total) by mouth 2 (two) times daily., Disp: 4 tablet, Rfl: 0   OLANZapine (ZYPREXA) 20 MG tablet, TAKE 1 TABLET BY MOUTH AT  BEDTIME, Disp: 90 tablet, Rfl: 3   omeprazole (PRILOSEC) 40 MG capsule, Take 40 mg by mouth daily. , Disp: , Rfl:    OnabotulinumtoxinA (BOTOX IJ), Inject as directed every 3 (three) months., Disp: , Rfl:    ondansetron (ZOFRAN-ODT) 4 MG disintegrating tablet, Take 4 mg by mouth every 8 (eight) hours as needed for nausea. , Disp: , Rfl:    prazosin (MINIPRESS) 1 MG capsule, Take 1 capsule (1 mg total) by mouth at bedtime., Disp: 30 capsule, Rfl: 1   promethazine (PHENERGAN) 12.5 MG tablet, Take 12.5 mg by mouth every 6 (six) hours as needed for nausea or vomiting. , Disp: , Rfl:    tamsulosin (FLOMAX) 0.4 MG CAPS capsule, Take 1 capsule (0.4 mg total) by mouth daily., Disp: 30 capsule, Rfl: 0   topiramate (TOPAMAX) 100 MG tablet, TAKE 1  AND 1/2 TABLETS(150 MG) BY MOUTH TWICE DAILY, Disp: 45 tablet, Rfl: 0   topiramate (TOPAMAX) 100 MG tablet, take one and one-half tablets by mouth twice a day, Disp: 270 tablet, Rfl: 1   traMADol (ULTRAM) 50 MG tablet, Take 50 mg by mouth every 6 (six) hours as needed for moderate pain (kidney stones)., Disp: , Rfl:    buPROPion (WELLBUTRIN XL) 300 MG 24 hr tablet, Take 1 tablet (300 mg total) by mouth daily., Disp: 90 tablet, Rfl: 1   busPIRone (BUSPAR) 30 MG tablet, Take 1 tablet (30 mg total) by mouth 2 (two) times daily., Disp: 180 tablet, Rfl: 1   fluvoxaMINE (LUVOX) 100 MG tablet, Take 2 tablets (200 mg total) by mouth at bedtime., Disp: 180 tablet, Rfl: 1   HYDROcodone-acetaminophen (NORCO/VICODIN) 5-325 MG tablet, Take 1 tablet by mouth every 8 (eight) hours as needed. (Patient not taking: Reported on 06/19/2022), Disp: 6 tablet, Rfl: 0   ipratropium-albuterol (DUONEB) 0.5-2.5 (3) MG/3ML SOLN, Inhale 3 mLs into the lungs every 6 (six) hours  as needed (SOB, wheezing).  (Patient not taking: Reported on 11/01/2021), Disp: , Rfl:    levothyroxine (SYNTHROID) 88 MCG tablet, Take 112 mcg by mouth daily before breakfast., Disp: , Rfl:    phenazopyridine (PYRIDIUM) 200 MG tablet, Take 1 tablet (200 mg total) by mouth 3 (three) times daily as needed for pain. (Patient not taking: Reported on 07/31/2021), Disp: 6 tablet, Rfl: 0   predniSONE (DELTASONE) 10 MG tablet, Take 10 mg by mouth daily with breakfast.  (Patient not taking: Reported on 01/23/2021), Disp: , Rfl:    QUEtiapine (SEROQUEL) 50 MG tablet, Take by mouth., Disp: , Rfl:    traZODone (DESYREL) 100 MG tablet, Take 1-1.5 tablets (100-150 mg total) by mouth at bedtime as needed for sleep., Disp: 135 tablet, Rfl: 1   zonisamide (ZONEGRAN) 50 MG capsule, Take by mouth., Disp: , Rfl:  Medication Side Effects: none  Family Medical/ Social History: Changes?  No  MENTAL HEALTH EXAM:  There were no vitals taken for this visit.There is no height or  weight on file to calculate BMI.    General Appearance:  Unable to assess  Eye Contact:   Unable to assess  Speech:  Clear and Coherent and Normal Rate  Volume:  Normal  Mood:  Euthymic  Affect:   Unable to assess  Thought Process:  Goal Directed and Descriptions of Associations: Intact  Orientation:  Full (Time, Place, and Person)  Thought Content: Logical   Suicidal Thoughts:  No  Homicidal Thoughts:  No  Memory:  WNL  Judgement:  Good  Insight:  Good  Psychomotor Activity:   Unable to assess  Concentration:  Concentration: Good and Attention Span: Good  Recall:  Good  Fund of Knowledge: Good  Language: Good  Assets:  Desire for Improvement  ADL's:  Intact  Cognition: WNL  Prognosis:  Good   Labs 06/11/2022 CBC with differential, iron profile, B12, TSH all reviewed.  See results on chart.  PCP follows glucose and lipids  DIAGNOSES:    ICD-10-CM   1. Bipolar I disorder ([place])  F31.9     2. Polypharmacy  Z79.899     3. PTSD (post-traumatic stress disorder)  F43.10     4. Generalized anxiety disorder  F41.1     5. Insomnia, unspecified type  G47.00     6. Hypotension, unspecified hypotension type  I95.9      Receiving Psychotherapy: Yes   Jesse Sans  RECOMMENDATIONS:  PDMP was reviewed. Valium filled 03/18/2022.   I provided 30 minutes of non-face-to-face time during this encounter, including time spent before and after the visit in records review, medical decision making, counseling pertinent to today's visit, and charting.   We discussed the low blood pressure and dizziness.  She is on prazosin for nightmares so recommend we decrease the dose of that and follow-up in a month.  Recommend that she checks her blood pressure a few times a week, even if she has to go to a pharmacy to have them check it.  If it is staying below 100/60 then she should stop the prazosin.  She ask what to do about the nightmares if they recur.  I will probably recommend increasing the  Seroquel dose and getting her off Zyprexa.  No other changes at this time.  Continue Wellbutrin XL 300 mg, 1 p.o. every morning. Continue BuSpar 30 mg, 1 p.o. twice daily. Continue Valium 5 mg, 1 p.o. twice daily as needed.   Continue Luvox 100 mg, 2 p.o.  nightly. Continue gabapentin 400 mg, 2 p.o. every morning, 2 p.o. at lunch, and 4 p.o. nightly. Continue hydroxyzine 25 mg, 1 p.o. twice daily as needed. Continue Zyprexa 20 mg, 1 p.o. nightly. Decrease prazosin to 1 mg p.o. nightly. Continue Seroquel 50 mg, 1 p.o. as directed per neuro. Continue Topamax per neuro. Continue trazodone 100 mg, 1.5 pills nightly as needed. Continue counseling. Return in 4 weeks.  Donnal Moat, PA-C

## 2022-07-09 ENCOUNTER — Other Ambulatory Visit: Payer: Self-pay | Admitting: Urology

## 2022-07-09 ENCOUNTER — Emergency Department (HOSPITAL_COMMUNITY)
Admission: EM | Admit: 2022-07-09 | Discharge: 2022-07-10 | Disposition: A | Discharge status: Home / Self Care | Payer: Medicare Other | Attending: Student | Admitting: Student

## 2022-07-09 DIAGNOSIS — R112 Nausea with vomiting, unspecified: Secondary | ICD-10-CM | POA: Insufficient documentation

## 2022-07-09 DIAGNOSIS — R109 Unspecified abdominal pain: Secondary | ICD-10-CM | POA: Diagnosis not present

## 2022-07-09 DIAGNOSIS — N200 Calculus of kidney: Secondary | ICD-10-CM

## 2022-07-09 DIAGNOSIS — N3000 Acute cystitis without hematuria: Secondary | ICD-10-CM

## 2022-07-09 DIAGNOSIS — E039 Hypothyroidism, unspecified: Secondary | ICD-10-CM | POA: Diagnosis not present

## 2022-07-09 DIAGNOSIS — Z79899 Other long term (current) drug therapy: Secondary | ICD-10-CM | POA: Diagnosis not present

## 2022-07-09 NOTE — Progress Notes (Signed)
COVID Vaccine Completed:  Date of COVID positive in last 90 days:  PCP - Agnes Lawrence, PA Cardiologist -   Chest x-ray - 07/04/22 [facility] EKG - 07/04/22 [facility] req Stress Test -  ECHO - 07/16/16 [facility] Cardiac Cath -  Pacemaker/ICD device last checked: Spinal Cord Stimulator:  Bowel Prep -   Sleep Study -  CPAP -   Fasting Blood Sugar -  Checks Blood Sugar _____ times a day  Blood Thinner Instructions: Aspirin Instructions: Last Dose:  Activity level:  Can go up a flight of stairs and perform activities of daily living without stopping and without symptoms of chest pain or shortness of breath.  Able to exercise without symptoms  Unable to go up a flight of stairs without symptoms of     Anesthesia review:   Patient denies shortness of breath, fever, cough and chest pain at PAT appointment  Patient verbalized understanding of instructions that were given to them at the PAT appointment. Patient was also instructed that they will need to review over the PAT instructions again at home before surgery.

## 2022-07-09 NOTE — Patient Instructions (Signed)
SURGICAL WAITING ROOM VISITATION Patients having surgery or a procedure may have no more than 2 support people in the waiting area - these visitors may rotate.   Children under the age of 40 must have an adult with them who is not the patient. If the patient needs to stay at the hospital during part of their recovery, the visitor guidelines for inpatient rooms apply. Pre-op nurse will coordinate an appropriate time for 1 support person to accompany patient in pre-op.  This support person may not rotate.    Please refer to the [facility] website for the visitor guidelines for Inpatients (after your surgery is over and you are in a regular room).    Your procedure is scheduled on: 07/12/22   Report to [facility] Main Entrance    Report to admitting at 1:45 PM   Call this number if you have problems the morning of surgery [phone]   Do not eat food :After Midnight.   After Midnight you may have the following liquids until 1:00 PM DAY OF SURGERY  Water Non-Citrus Juices (without pulp, NO RED) Carbonated Beverages Black Coffee (NO MILK/CREAM OR CREAMERS, sugar ok)  Clear Tea (NO MILK/CREAM OR CREAMERS, sugar ok) regular and decaf                             Plain Jell-O (NO RED)                                           Fruit ices (not with fruit pulp, NO RED)                                     Popsicles (NO RED)                                                               Sports drinks like Gatorade (NO RED)              Drink 2 Ensure/G2 drinks AT 10:00 PM the night before surgery.        The day of surgery:  Drink ONE (1) Pre-Surgery Clear Ensure or G2 at AM the morning of surgery. Drink in one sitting. Do not sip.  This drink was given to you during your hospital  pre-op appointment visit. Nothing else to drink after completing the  Pre-Surgery Clear Ensure or G2.          If you have questions, please contact your surgeon's office.   FOLLOW BOWEL PREP AND  ANY ADDITIONAL PRE OP INSTRUCTIONS YOU RECEIVED FROM YOUR SURGEON'S OFFICE!!!     Oral Hygiene is also important to reduce your risk of infection.                                    Remember - BRUSH YOUR TEETH THE MORNING OF SURGERY WITH YOUR REGULAR TOOTHPASTE   Do NOT smoke after Midnight   Take these medicines the morning of surgery  with A SIP OF WATER: Wellbutrin, Buspirone, Valium, Flonase, Gabapentin, Levothyroxine, Lyrica, Omeprazole, Zofran, Seroquel, Topiramate, Tramadol  DO NOT TAKE ANY ORAL DIABETIC MEDICATIONS DAY OF YOUR SURGERY  Bring CPAP mask and tubing day of surgery.                              You may not have any metal on your body including hair pins, jewelry, and body piercing             Do not wear make-up, lotions, powders, perfumes, or deodorant  Do not wear nail polish including gel and S&S, artificial/acrylic nails, or any other type of covering on natural nails including finger and toenails. If you have artificial nails, gel coating, etc. that needs to be removed by a nail salon please have this removed prior to surgery or surgery may need to be canceled/ delayed if the surgeon/ anesthesia feels like they are unable to be safely monitored.   Do not shave  48 hours prior to surgery.    Do not bring valuables to the hospital. [place].   Contacts, dentures or bridgework may not be worn into surgery.  DO NOT [place]. PHARMACY WILL DISPENSE MEDICATIONS LISTED ON YOUR MEDICATION LIST TO YOU DURING YOUR ADMISSION [place]!    Patients discharged on the day of surgery will not be allowed to drive home.  Someone NEEDS to stay with you for the first 24 hours after anesthesia.   Special Instructions: Bring a copy of your healthcare power of attorney and living will documents         the day of surgery if you haven't scanned them before.              Please read over the  following fact sheets you were given: IF YOU HAVE QUESTIONS ABOUT YOUR PRE-OP INSTRUCTIONS PLEASE CALL [name] - Preparing for Surgery Before surgery, you can play an important role.  Because skin is not sterile, your skin needs to be as free of germs as possible.  You can reduce the number of germs on your skin by washing with CHG (chlorahexidine gluconate) soap before surgery.  CHG is an antiseptic cleaner which kills germs and bonds with the skin to continue killing germs even after washing. Please DO NOT use if you have an allergy to CHG or antibacterial soaps.  If your skin becomes reddened/irritated stop using the CHG and inform your nurse when you arrive at Short Stay. Do not shave (including legs and underarms) for at least 48 hours prior to the first CHG shower.  You may shave your face/neck.  Please follow these instructions carefully:  1.  Shower with CHG Soap the night before surgery and the  morning of surgery.  2.  If you choose to wash your hair, wash your hair first as usual with your normal  shampoo.  3.  After you shampoo, rinse your hair and body thoroughly to remove the shampoo.                             4.  Use CHG as you would any other liquid soap.  You can apply chg directly to the skin and  wash.  Gently with a scrungie or clean washcloth.  5.  Apply the CHG Soap to your body ONLY FROM THE NECK DOWN.   Do   not use on face/ open                           Wound or open sores. Avoid contact with eyes, ears mouth and   genitals (private parts).                       Wash face,  Genitals (private parts) with your normal soap.             6.  Wash thoroughly, paying special attention to the area where your    surgery  will be performed.  7.  Thoroughly rinse your body with warm water from the neck down.  8.  DO NOT shower/wash with your normal soap after using and rinsing off the CHG Soap.                9.  Pat yourself dry with a clean towel.             10.  Wear clean pajamas.            11.  Place clean sheets on your bed the night of your first shower and do not  sleep with pets. Day of Surgery : Do not apply any lotions/deodorants the morning of surgery.  Please wear clean clothes to the hospital/surgery center.  FAILURE TO FOLLOW THESE INSTRUCTIONS MAY RESULT IN THE CANCELLATION OF YOUR SURGERY  PATIENT SIGNATURE_________________________________  NURSE SIGNATURE__________________________________  ________________________________________________________________________

## 2022-07-09 NOTE — Progress Notes (Signed)
Please place orders for PAT appointment scheduled 07/10/22.

## 2022-07-10 ENCOUNTER — Encounter (HOSPITAL_COMMUNITY): Payer: Self-pay

## 2022-07-10 ENCOUNTER — Other Ambulatory Visit: Payer: Self-pay

## 2022-07-10 ENCOUNTER — Encounter (HOSPITAL_COMMUNITY)
Admission: RE | Admit: 2022-07-10 | Discharge: 2022-07-10 | Disposition: A | Discharge status: Home / Self Care | Payer: Medicare Other | Source: Ambulatory Visit | Attending: Urology | Admitting: Urology

## 2022-07-10 VITALS — BP 100/69 | HR 69 | Temp 97.7°F | Resp 14 | Ht 63.0 in | Wt 123.2 lb

## 2022-07-10 DIAGNOSIS — Z01818 Encounter for other preprocedural examination: Secondary | ICD-10-CM | POA: Diagnosis present

## 2022-07-10 DIAGNOSIS — R109 Unspecified abdominal pain: Secondary | ICD-10-CM | POA: Diagnosis not present

## 2022-07-10 DIAGNOSIS — I2510 Atherosclerotic heart disease of native coronary artery without angina pectoris: Secondary | ICD-10-CM

## 2022-07-10 HISTORY — DX: Fibromyalgia: M79.7

## 2022-07-10 HISTORY — DX: Depression, unspecified: F32.A

## 2022-07-10 HISTORY — DX: Anxiety disorder, unspecified: F41.9

## 2022-07-10 HISTORY — DX: Anemia, unspecified: D64.9

## 2022-07-10 LAB — COMPREHENSIVE METABOLIC PANEL
ALT: 13 U/L (ref 0–44)
AST: 18 U/L (ref 15–41)
Albumin: 3.6 g/dL (ref 3.5–5.0)
Alkaline Phosphatase: 85 U/L (ref 38–126)
Anion gap: 4 — ABNORMAL LOW (ref 5–15)
BUN: 22 mg/dL — ABNORMAL HIGH (ref 6–20)
CO2: 24 mmol/L (ref 22–32)
Calcium: 8.6 mg/dL — ABNORMAL LOW (ref 8.9–10.3)
Chloride: 111 mmol/L (ref 98–111)
Creatinine, Ser: 0.92 mg/dL (ref 0.44–1.00)
GFR, Estimated: >60 mL/min (ref >60)
Glucose, Bld: 105 mg/dL — ABNORMAL HIGH (ref 70–99)
Potassium: 4.1 mmol/L (ref 3.5–5.1)
Sodium: 139 mmol/L (ref 135–145)
Total Bilirubin: 0.6 mg/dL (ref 0.3–1.2)
Total Protein: 6.2 g/dL — ABNORMAL LOW (ref 6.5–8.1)

## 2022-07-10 LAB — CBC WITH DIFFERENTIAL/PLATELET
Abs Immature Granulocytes: 0.05 10*3/uL (ref 0.00–0.07)
Basophils Absolute: 0 10*3/uL (ref 0.0–0.1)
Basophils Relative: 1 %
Eosinophils Absolute: 0.3 10*3/uL (ref 0.0–0.5)
Eosinophils Relative: 5 %
HCT: 38 % (ref 36.0–46.0)
Hemoglobin: 12.2 g/dL (ref 12.0–15.0)
Immature Granulocytes: 1 %
Lymphocytes Relative: 34 %
Lymphs Abs: 2 10*3/uL (ref 0.7–4.0)
MCH: 27.6 pg (ref 26.0–34.0)
MCHC: 32.1 g/dL (ref 30.0–36.0)
MCV: 86 fL (ref 80.0–100.0)
Monocytes Absolute: 0.5 10*3/uL (ref 0.1–1.0)
Monocytes Relative: 8 %
Neutro Abs: 3.2 10*3/uL (ref 1.7–7.7)
Neutrophils Relative %: 51 %
Platelets: 187 10*3/uL (ref 150–400)
RBC: 4.42 MIL/uL (ref 3.87–5.11)
RDW: 13.3 % (ref 11.5–15.5)
WBC: 6.1 10*3/uL (ref 4.0–10.5)
nRBC: 0 % (ref 0.0–0.2)

## 2022-07-10 LAB — URINALYSIS, ROUTINE W REFLEX MICROSCOPIC
Bilirubin Urine: NEGATIVE
Glucose, UA: NEGATIVE mg/dL
Ketones, ur: 5 mg/dL — AB
Nitrite: NEGATIVE
Protein, ur: 30 mg/dL — AB
RBC / HPF: >50 RBC/hpf — ABNORMAL HIGH (ref 0–5)
Specific Gravity, Urine: 1.035 — ABNORMAL HIGH (ref 1.005–1.030)
WBC, UA: >50 WBC/hpf — ABNORMAL HIGH (ref 0–5)
pH: 5 (ref 5.0–8.0)

## 2022-07-10 LAB — LIPASE, BLOOD: Lipase: 33 U/L (ref 11–51)

## 2022-07-10 MED ORDER — KETOROLAC TROMETHAMINE 15 MG/ML IJ SOLN
15.0000 mg | Freq: Once | INTRAMUSCULAR | Status: AC
  Administered 2022-07-10: 15 mg via INTRAVENOUS
  Filled 2022-07-10: qty 1

## 2022-07-10 MED ORDER — ONDANSETRON HCL 4 MG/2ML IJ SOLN
4.0000 mg | Freq: Once | INTRAMUSCULAR | Status: AC
  Administered 2022-07-10: 4 mg via INTRAVENOUS
  Filled 2022-07-10: qty 2

## 2022-07-10 MED ORDER — LACTATED RINGERS IV BOLUS
1000.0000 mL | Freq: Once | INTRAVENOUS | Status: AC
  Administered 2022-07-10: 1000 mL via INTRAVENOUS

## 2022-07-10 MED ORDER — MORPHINE SULFATE (PF) 4 MG/ML IV SOLN
4.0000 mg | Freq: Once | INTRAVENOUS | Status: AC
  Administered 2022-07-10: 4 mg via INTRAVENOUS
  Filled 2022-07-10: qty 1

## 2022-07-10 MED ORDER — CEFTRIAXONE SODIUM 1 G IJ SOLR
1.0000 g | Freq: Once | INTRAMUSCULAR | Status: AC
  Administered 2022-07-10: 1 g via INTRAVENOUS
  Filled 2022-07-10: qty 10

## 2022-07-10 MED ORDER — CEFADROXIL 500 MG PO CAPS: 500.0000 mg | ORAL_CAPSULE | Freq: Two times a day (BID) | ORAL | 0 refills | Status: AC

## 2022-07-10 MED ORDER — MAALOX MAX 400-400-40 MG/5ML PO SUSP: 15.0000 mL | Freq: Four times a day (QID) | ORAL | 0 refills | Status: AC | PRN

## 2022-07-10 MED ORDER — LIDOCAINE VISCOUS HCL 2 % MT SOLN
15.0000 mL | Freq: Once | OROMUCOSAL | Status: AC
  Administered 2022-07-10: 15 mL via OROMUCOSAL
  Filled 2022-07-10: qty 15

## 2022-07-10 MED ORDER — ALUM & MAG HYDROXIDE-SIMETH 200-200-20 MG/5ML PO SUSP
30.0000 mL | Freq: Once | ORAL | Status: AC
  Administered 2022-07-10: 30 mL via ORAL
  Filled 2022-07-10: qty 30

## 2022-07-10 NOTE — ED Triage Notes (Signed)
Pt reports right flank pain not relieved with medications given from urology. Pt states she has surgery with urology scheduled for Friday. Pt having frequency and pain with urination. Only able to produce small amounts of urine at a time.

## 2022-07-10 NOTE — Progress Notes (Addendum)
COVID Vaccine Completed:  Yes   Date of COVID positive in last 90 days:  No   PCP - Agnes Lawrence, PA Cardiologist - Dema Severin, MD (last saw 2008 for chest pain).  [place] negative, chest pain believed to be related to acid reflux   Chest x-ray - 07/04/22 [facility] EKG - 07/04/22 on chart Stress Test -  2008 with cardiology (neg) ECHO - 07/16/16 Epic Cardiac Cath - N/A Pacemaker/ICD device last checked: Spinal Cord Stimulator:  N/A   Bowel Prep - N/A   Sleep Study -  Yes, neg sleep apnea CPAP -  No  Fasting Blood Sugar - N/A Checks Blood Sugar _____ times a day   Blood Thinner Instructions:  N/A Aspirin Instructions: Last Dose:   Activity level:   Can go up a flight of stairs and perform activities of daily living without stopping and without symptoms of chest pain or shortness of breath.  Able to exercise without symptoms               Anesthesia review: N/A   Patient denies shortness of breath, fever, cough and chest pain at PAT appointment   Patient verbalized understanding of instructions that were given to them at the PAT appointment. Patient was also instructed that they will need to review over the PAT instructions again at home before surgery.

## 2022-07-10 NOTE — ED Provider Notes (Signed)
[facility] DEPT Provider Note  CSN: [id number] Arrival date & time: 07/09/22 2352  Chief Complaint(s) Flank Pain  HPI Morgan [name] is a 51 y.o. female with PMH nephrolithiasis who presents emergency department for evaluation of right flank pain.  [facility] earlier today in clinic and received a CAT scan that showed a 2 mm UVJ stone and her Vicodin prescription was refilled.  She was instructed to come to the emergency department if she has decreased urinary output or worsening pain.  Patient states that she has been unable to control her pain and has had decreased urinary output.  He also endorses nausea and vomiting.  She denies fever, chest pain, shortness of breath or other systemic symptoms.   Past Medical History Past Medical History:  Diagnosis Date   Acid reflux    Family history of adverse reaction to anesthesia    sister has PONV   H. 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 Authorizing Provider  AIMOVIG 70 MG/ML SOAJ Inject 70 mg into the muscle every 30 (thirty) days. 10/13/17   [provider]  AMITIZA 24 MCG capsule  Take 24 mcg by mouth 2 (two) times daily with a meal.  09/24/17   [provider]  BOTOX 200 units injection Inject 200 Units into the muscle every 30 (thirty) days. 06/24/22   [provider]  buPROPion (WELLBUTRIN XL) 300 MG 24 hr tablet Take 1 tablet (300 mg total) by mouth daily. 06/19/22   Donnal Moat T, PA-C  busPIRone (BUSPAR) 30 MG tablet Take 1 tablet (30 mg total) by mouth 2 (two) times daily. 06/19/22   Donnal Moat T, PA-C  cyclobenzaprine (FLEXERIL) 10 MG tablet Take 1 tablet (10 mg total) by mouth 3 (three) times daily as needed for muscle spasms. 11/04/18   Shugart, Lissa Hoard, PA-C  diazepam (VALIUM) 5 MG tablet Take 1 tablet (5 mg total) by mouth every 12 (twelve) hours as needed for anxiety. 03/18/22   Donnal Moat T, PA-C  ENULOSE 10 GM/15ML SOLN Take 20 g by mouth in the morning and at bedtime. 05/09/22   [provider]  fluticasone (FLONASE) 50 MCG/ACT nasal spray Place 1 spray into both nostrils daily as needed for allergies or rhinitis.    [provider]  fluvoxaMINE (LUVOX) 100 MG tablet Take 2 tablets (200 mg total) by mouth at bedtime. 06/19/22   Donnal Moat T, PA-C  gabapentin (NEURONTIN) 800 MG tablet 1 p.o. every morning, 1 p.o. at lunch, 2 p.o. nightly.  May crash and put in applesauce or pudding if unable to swallow tablets. 06/12/22  Donnal Moat T, PA-C  HYDROcodone-acetaminophen (NORCO/VICODIN) 5-325 MG tablet Take 1 tablet by mouth every 8 (eight) hours as needed. Patient taking differently: Take 2 tablets by mouth every 6 (six) hours as needed (kidney stone pain). 04/21/22   Varney Biles, MD  hydrOXYzine (ATARAX/VISTARIL) 25 MG tablet Take 1 tablet (25 mg total) by mouth 2 (two) times daily. 07/27/19   Mozingo, Berdie Ogren, NP  ipratropium-albuterol (DUONEB) 0.5-2.5 (3) MG/3ML SOLN Inhale 3 mLs into the lungs every 6 (six) hours as needed (shortness of breath or wheezing).    [provider]  levothyroxine (SYNTHROID) 88 MCG  tablet Take 88 mcg by mouth daily before breakfast.    [provider]  linaclotide (LINZESS) 290 MCG CAPS capsule Take 290 mcg by mouth daily before breakfast.    [provider]  LYRICA 100 MG capsule  10/23/17   [provider]  montelukast (SINGULAIR) 10 MG tablet Take 10 mg by mouth at bedtime.  02/05/16   [provider]  Multiple Vitamin (MULTI VITAMIN) TABS Take 1 tablet by mouth daily. 09/23/19   Donnal Moat T, PA-C  naproxen (NAPROSYN) 500 MG tablet Take 1 tablet (500 mg total) by mouth 2 (two) times daily. Patient taking differently: Take 500 mg by mouth 2 (two) times daily as needed for moderate pain. 04/21/22   Varney Biles, MD  OLANZapine (ZYPREXA) 20 MG tablet TAKE 1 TABLET BY MOUTH AT  BEDTIME 03/08/22   Hurst, Helene Kelp T, PA-C  omeprazole (PRILOSEC) 40 MG capsule Take 40 mg by mouth in the morning and at bedtime. 07/29/17   [provider]  ondansetron (ZOFRAN-ODT) 4 MG disintegrating tablet Take 4 mg by mouth every 8 (eight) hours as needed for nausea.  02/05/16   [provider]  oxybutynin (DITROPAN) 5 MG tablet Take 5 mg by mouth 2 (two) times daily. 07/09/22   [provider]  phenazopyridine (PYRIDIUM) 200 MG tablet Take 1 tablet (200 mg total) by mouth 3 (three) times daily as needed for pain. Patient not taking: Reported on 07/31/2021 02/13/21   Palumbo, April, MD  prazosin (MINIPRESS) 1 MG capsule Take 1 capsule (1 mg total) by mouth at bedtime. 06/19/22   Addison Lank, PA-C  predniSONE (DELTASONE) 10 MG tablet Take 10 mg by mouth daily with breakfast.  Patient not taking: Reported on 01/23/2021 10/23/17   [provider]  promethazine (PHENERGAN) 12.5 MG tablet Take 12.5 mg by mouth every 6 (six) hours as needed for nausea or vomiting.  10/29/17   [provider]  QUEtiapine (SEROQUEL) 50 MG tablet Take 50 mg by mouth at bedtime. 10/18/21 07/09/22  [provider]  sertraline (ZOLOFT) 100 MG  tablet Take 100 mg by mouth daily. 04/28/22   [provider]  tamsulosin (FLOMAX) 0.4 MG CAPS capsule Take 1 capsule (0.4 mg total) by mouth daily. 02/10/22   Myna Bright M, PA-C  topiramate (TOPAMAX) 100 MG tablet TAKE 1 AND 1/2 TABLETS(150 MG) BY MOUTH TWICE DAILY Patient not taking: Reported on 07/09/2022 07/24/21   Donnal Moat T, PA-C  topiramate (TOPAMAX) 100 MG tablet take one and one-half tablets by mouth twice a day Patient taking differently: Take 100 mg by mouth in the morning, at noon, and at bedtime. 01/30/22   Donnal Moat T, PA-C  traMADol (ULTRAM) 50 MG tablet Take 100 mg by mouth every 6 (six) hours as needed for moderate pain.    [provider]  traZODone (DESYREL) 100 MG tablet Take 1-1.5  tablets (100-150 mg total) by mouth at bedtime as needed for sleep. 06/19/22   Alen Blew                                                                                                                                    Past Surgical History Past Surgical History:  Procedure Laterality Date   ABDOMINAL HYSTERECTOMY     CHOLECYSTECTOMY     CYSTOSCOPY WITH RETROGRADE PYELOGRAM, URETEROSCOPY AND STENT PLACEMENT Left 12/26/2014   Procedure: CYSTOSCOPY WITH RETROGRADE PYELOGRAM, URETEROSCOPY AND STENT PLACEMENT;  Surgeon: Bernestine Amass, MD;  Location: [facility];  Service: Urology;  Laterality: Left;   CYSTOSCOPY WITH RETROGRADE PYELOGRAM, URETEROSCOPY AND STENT PLACEMENT Left 02/11/2022   Procedure: CYSTOSCOPY WITH RETROGRADE PYELOGRAM, URETEROSCOPY AND STENT PLACEMENT;  Surgeon: Irine Seal, MD;  Location: [facility];  Service: Urology;  Laterality: Left;   CYSTOSCOPY/RETROGRADE/URETEROSCOPY Right 03/31/2013   Procedure: CYSTOSCOPY/RETROGRADE/URETEROSCOPY;  Surgeon: Bernestine Amass, MD;  Location[facility];  Service: Urology;  Laterality: Right;   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   HOLMIUM LASER APPLICATION Right 12/30/9415   Procedure: HOLMIUM  LASER APPLICATION;  Surgeon: Bernestine Amass, MD;  Location: [facility];  Service: Urology;  Laterality: Right;   HOLMIUM LASER APPLICATION Left 4/0/8144   Procedure: HOLMIUM LASER APPLICATION;  Surgeon: Bernestine Amass, MD;  Location: [facility];  Service: Urology;  Laterality: Left;   LAPAROSCOPIC ASSISTED VAGINAL HYSTERECTOMY  MARCH 2014   W/ BILATERAL SALPINGOOPHORECTOMY   LAPAROSCOPIC GASTRIC BANDING  SEPT 2011   TONSILLECTOMY AND ADENOIDECTOMY  AGE 24'S   URETEROLITHOTOMY  NOV 2013   Family History Family History  Problem Relation Age of Onset   Diabetes Mother    Hypertension Mother    Cancer Mother    Hypertension Father    COPD Father    Deafness Father    Emphysema Father     Social History Social History   Tobacco Use   Smoking status: Never   Smokeless tobacco: Never  Vaping Use   Vaping Use: Never used  Substance Use Topics   Alcohol use: No   Drug use: No   Allergies Clindamycin/lincomycin, Nsaids, Oxycodone, and Percocet [oxycodone-acetaminophen]  Review of Systems Review of Systems  Gastrointestinal:  Positive for nausea and vomiting.  Genitourinary:  Positive for flank pain.    Physical Exam Vital Signs  I have reviewed the triage vital signs BP 96/60 (BP Location: Right Arm)   Pulse 61   Temp 97.7 F (36.5 C) (Oral)   Resp 18   Ht '5\' 3"'$  (1.6 m)   Wt 52.2 kg   SpO2 100%   BMI 20.37 kg/m   Physical Exam Vitals and nursing note reviewed.  Constitutional:      General: She is not in acute distress.    Appearance: She is well-developed.  HENT:     Head: Normocephalic  and atraumatic.  Eyes:     Conjunctiva/sclera: Conjunctivae normal.  Cardiovascular:     Rate and Rhythm: Normal rate and regular rhythm.     Heart sounds: No murmur heard. Pulmonary:     Effort: Pulmonary effort is normal. No respiratory distress.     Breath sounds: Normal breath sounds.  Abdominal:     Palpations: Abdomen is soft.      Tenderness: There is no abdominal tenderness. There is right CVA tenderness.  Musculoskeletal:        General: No swelling.     Cervical back: Neck supple.  Skin:    General: Skin is warm and dry.     Capillary Refill: Capillary refill takes less than 2 seconds.  Neurological:     Mental Status: She is alert.  Psychiatric:        Mood and Affect: Mood normal.     ED Results and Treatments Labs (all labs ordered are listed, but only abnormal results are displayed) Labs Reviewed  COMPREHENSIVE METABOLIC PANEL - Abnormal; Notable for the following components:      Result Value   Glucose, Bld 105 (*)    BUN 22 (*)    Calcium 8.6 (*)    Total Protein 6.2 (*)    Anion gap 4 (*)    All other components within normal limits  URINALYSIS, ROUTINE W REFLEX MICROSCOPIC - Abnormal; Notable for the following components:   APPearance CLOUDY (*)    Specific Gravity, Urine 1.035 (*)    Hgb urine dipstick SMALL (*)    Ketones, ur 5 (*)    Protein, ur 30 (*)    Leukocytes,Ua LARGE (*)    RBC / HPF >50 (*)    WBC, UA >50 (*)    Bacteria, UA RARE (*)    All other components within normal limits  LIPASE, BLOOD  CBC WITH DIFFERENTIAL/PLATELET                                                                                                                          Radiology No results found.  Pertinent labs & imaging results that were available during my care of the patient were reviewed by me and considered in my medical decision making (see MDM for details).  Medications Ordered in ED Medications  lactated ringers bolus 1,000 mL (has no administration in time range)  morphine (PF) 4 MG/ML injection 4 mg (has no administration in time range)  ondansetron (ZOFRAN) injection 4 mg (has no administration in time range)  Procedures Procedures  (including  critical care time)  Medical Decision Making / ED Course   This patient presents to the ED for concern of abdominal pain and flank pain, this involves an extensive number of treatment options, and is a complaint that carries with it a high risk of complications and morbidity.  The differential diagnosis includes nephrolithiasis, gastritis, pyelonephritis, cystitis  MDM: Seen the emergency room for evaluation of abdominal pain and flank pain in the setting of known nephrolithiasis.  Physical exam with right lower quadrant tenderness to palpation and right CVA tenderness.  There is also mild epigastric tenderness to palpation.  Laboratory evaluation with a normal creatinine and is overall unremarkable.  Urinalysis however has large leuk esterase, greater than 50 red blood cells, greater than 50 white blood cells and rare bacteria.  Urine culture sent patient given first dose of ceftriaxone.  I spoke with the urologist on-call Dr. Louis Meckel who states that if the patient can be appropriately pain controlled she can be discharged as she has preop follow-up tomorrow and a procedure scheduled for the 25th.  Pain controlled with single dose Toradol, morphine and GI cocktail.  On reevaluation, patient states pain is significant improved and patient discharged with preop follow-up today.  Patient discharged on [place] for cystitis.   Additional history obtained: -Additional history obtained from husband -External records from outside source obtained and reviewed including: Chart review including previous notes, labs, imaging, consultation notes   Lab Tests: -I ordered, reviewed, and interpreted labs.   The pertinent results include:   Labs Reviewed  COMPREHENSIVE METABOLIC PANEL - Abnormal; Notable for the following components:      Result Value   Glucose, Bld 105 (*)    BUN 22 (*)    Calcium 8.6 (*)    Total Protein 6.2 (*)    Anion gap 4 (*)    All other components within normal limits   URINALYSIS, ROUTINE W REFLEX MICROSCOPIC - Abnormal; Notable for the following components:   APPearance CLOUDY (*)    Specific Gravity, Urine 1.035 (*)    Hgb urine dipstick SMALL (*)    Ketones, ur 5 (*)    Protein, ur 30 (*)    Leukocytes,Ua LARGE (*)    RBC / HPF >50 (*)    WBC, UA >50 (*)    Bacteria, UA RARE (*)    All other components within normal limits  LIPASE, BLOOD  CBC WITH DIFFERENTIAL/PLATELET     Medicines ordered and prescription drug management: Meds ordered this encounter  Medications   lactated ringers bolus 1,000 mL   morphine (PF) 4 MG/ML injection 4 mg   ondansetron (ZOFRAN) injection 4 mg    -I have reviewed the patients home medicines and have made adjustments as needed  Critical interventions none  Consultations Obtained: I requested consultation with the urologist Dr. Louis Meckel,  and discussed lab and imaging findings as well as pertinent plan - they recommend: Pain control, urine culture and discharge   Cardiac Monitoring: The patient was maintained on a cardiac monitor.  I personally viewed and interpreted the cardiac monitored which showed an underlying rhythm of: NSR  Social Determinants of Health:  Factors impacting patients care include: none   Reevaluation: After the interventions noted above, I reevaluated the patient and found that they have :improved  Co morbidities that complicate the patient evaluation  Past Medical History:  Diagnosis Date   Acid reflux    Family history of adverse reaction to anesthesia  sister has PONV   H. pylori infection    hx of , none now   Headache    migraines, uses imitrex as needed   Hematuria    History of kidney stones    Hypothyroidism    PONV (postoperative nausea and vomiting)    Renal calculi BILATERAL   Right ureteral stone    RLS (restless legs syndrome)    TBI (traumatic brain injury) ([place]) 04/12/2015      Dispostion: I considered admission for this patient, but with overall  reassuring work-up and pain under control, patient safe for discharge with outpatient follow-up     Final Clinical Impression(s) / ED Diagnoses Final diagnoses:  None     '@PCDICTATION'$ @    Teressa Lower, MD 07/10/22 [phone]

## 2022-07-11 LAB — URINE CULTURE: Culture: NO GROWTH

## 2022-07-12 ENCOUNTER — Encounter (HOSPITAL_COMMUNITY)
Admission: RE | Disposition: A | Discharge status: Home / Self Care | Payer: Self-pay | Source: Home / Self Care | Attending: Urology

## 2022-07-12 ENCOUNTER — Ambulatory Visit (HOSPITAL_COMMUNITY): Payer: Medicare Other

## 2022-07-12 ENCOUNTER — Ambulatory Visit (HOSPITAL_BASED_OUTPATIENT_CLINIC_OR_DEPARTMENT_OTHER): Payer: Medicare Other | Admitting: Certified Registered Nurse Anesthetist

## 2022-07-12 ENCOUNTER — Ambulatory Visit (HOSPITAL_COMMUNITY): Payer: Medicare Other | Admitting: Certified Registered Nurse Anesthetist

## 2022-07-12 ENCOUNTER — Ambulatory Visit (HOSPITAL_COMMUNITY)
Admission: RE | Admit: 2022-07-12 | Discharge: 2022-07-12 | Disposition: A | Discharge status: Home / Self Care | Payer: Medicare Other | Attending: Urology | Admitting: Urology

## 2022-07-12 ENCOUNTER — Encounter (HOSPITAL_COMMUNITY): Payer: Self-pay | Admitting: Urology

## 2022-07-12 DIAGNOSIS — E039 Hypothyroidism, unspecified: Secondary | ICD-10-CM | POA: Diagnosis not present

## 2022-07-12 DIAGNOSIS — Z9884 Bariatric surgery status: Secondary | ICD-10-CM | POA: Insufficient documentation

## 2022-07-12 DIAGNOSIS — N132 Hydronephrosis with renal and ureteral calculous obstruction: Secondary | ICD-10-CM | POA: Diagnosis not present

## 2022-07-12 DIAGNOSIS — I2510 Atherosclerotic heart disease of native coronary artery without angina pectoris: Secondary | ICD-10-CM

## 2022-07-12 DIAGNOSIS — Z79899 Other long term (current) drug therapy: Secondary | ICD-10-CM | POA: Diagnosis not present

## 2022-07-12 DIAGNOSIS — R519 Headache, unspecified: Secondary | ICD-10-CM | POA: Diagnosis not present

## 2022-07-12 DIAGNOSIS — K219 Gastro-esophageal reflux disease without esophagitis: Secondary | ICD-10-CM | POA: Insufficient documentation

## 2022-07-12 DIAGNOSIS — N201 Calculus of ureter: Secondary | ICD-10-CM | POA: Diagnosis not present

## 2022-07-12 DIAGNOSIS — F418 Other specified anxiety disorders: Secondary | ICD-10-CM | POA: Insufficient documentation

## 2022-07-12 HISTORY — PX: CYSTOSCOPY WITH RETROGRADE PYELOGRAM, URETEROSCOPY AND STENT PLACEMENT: SHX5789

## 2022-07-12 SURGERY — CYSTOURETEROSCOPY, WITH RETROGRADE PYELOGRAM AND STENT INSERTION
Anesthesia: General | Laterality: Right

## 2022-07-12 MED ORDER — ONDANSETRON HCL 4 MG/2ML IJ SOLN
4.0000 mg | Freq: Once | INTRAMUSCULAR | Status: AC
Start: 2022-07-12 — End: 2022-07-12
  Administered 2022-07-12: 4 mg via INTRAVENOUS

## 2022-07-12 MED ORDER — MIDAZOLAM HCL 2 MG/2ML IJ SOLN
INTRAMUSCULAR | Status: AC
  Filled 2022-07-12: qty 2

## 2022-07-12 MED ORDER — DEXAMETHASONE SODIUM PHOSPHATE 10 MG/ML IJ SOLN
INTRAMUSCULAR | Status: AC
  Filled 2022-07-12: qty 1

## 2022-07-12 MED ORDER — CHLORHEXIDINE GLUCONATE 0.12 % MT SOLN
15.0000 mL | Freq: Once | OROMUCOSAL | Status: AC
Start: 2022-07-12 — End: 2022-07-12
  Administered 2022-07-12: 15 mL via OROMUCOSAL

## 2022-07-12 MED ORDER — DEXAMETHASONE SODIUM PHOSPHATE 10 MG/ML IJ SOLN
INTRAMUSCULAR | Status: DC | PRN
  Administered 2022-07-12: 6 mg via INTRAVENOUS

## 2022-07-12 MED ORDER — EPHEDRINE SULFATE-NACL 50-0.9 MG/10ML-% IV SOSY
PREFILLED_SYRINGE | INTRAVENOUS | Status: DC | PRN
  Administered 2022-07-12 (×2): 5 mg via INTRAVENOUS

## 2022-07-12 MED ORDER — HYDROCODONE-ACETAMINOPHEN 5-325 MG PO TABS: 2.0000 | ORAL_TABLET | Freq: Four times a day (QID) | ORAL | 0 refills | Status: DC | PRN

## 2022-07-12 MED ORDER — ORAL CARE MOUTH RINSE: 15.0000 mL | Freq: Once | OROMUCOSAL | Status: AC

## 2022-07-12 MED ORDER — SODIUM CHLORIDE 0.9% FLUSH: 3.0000 mL | Freq: Two times a day (BID) | INTRAVENOUS | Status: DC

## 2022-07-12 MED ORDER — IOHEXOL 300 MG/ML  SOLN
INTRAMUSCULAR | Status: DC | PRN
  Administered 2022-07-12: 3 mL

## 2022-07-12 MED ORDER — CEFAZOLIN SODIUM-DEXTROSE 2-4 GM/100ML-% IV SOLN
2.0000 g | INTRAVENOUS | Status: AC
  Administered 2022-07-12: 2 g via INTRAVENOUS
  Filled 2022-07-12: qty 100

## 2022-07-12 MED ORDER — PROPOFOL 500 MG/50ML IV EMUL
INTRAVENOUS | Status: DC | PRN
  Administered 2022-07-12: 150 ug/kg/min via INTRAVENOUS

## 2022-07-12 MED ORDER — MIDAZOLAM HCL 5 MG/5ML IJ SOLN
INTRAMUSCULAR | Status: DC | PRN
  Administered 2022-07-12: 2 mg via INTRAVENOUS

## 2022-07-12 MED ORDER — SCOPOLAMINE 1 MG/3DAYS TD PT72: 1.0000 | MEDICATED_PATCH | TRANSDERMAL | Status: DC

## 2022-07-12 MED ORDER — LIDOCAINE 2% (20 MG/ML) 5 ML SYRINGE
INTRAMUSCULAR | Status: DC | PRN
  Administered 2022-07-12: 60 mg via INTRAVENOUS

## 2022-07-12 MED ORDER — ONDANSETRON HCL 4 MG/2ML IJ SOLN
INTRAMUSCULAR | Status: AC
  Filled 2022-07-12: qty 2

## 2022-07-12 MED ORDER — LACTATED RINGERS IV SOLN: INTRAVENOUS | Status: DC

## 2022-07-12 MED ORDER — SODIUM CHLORIDE 0.9 % IR SOLN
Status: DC | PRN
  Administered 2022-07-12: 3000 mL

## 2022-07-12 MED ORDER — LIDOCAINE HCL (PF) 2 % IJ SOLN
INTRAMUSCULAR | Status: AC
  Filled 2022-07-12: qty 5

## 2022-07-12 MED ORDER — PROPOFOL 10 MG/ML IV BOLUS
INTRAVENOUS | Status: DC | PRN
  Administered 2022-07-12: 200 mg via INTRAVENOUS

## 2022-07-12 MED ORDER — FENTANYL CITRATE (PF) 100 MCG/2ML IJ SOLN
INTRAMUSCULAR | Status: AC
  Filled 2022-07-12: qty 2

## 2022-07-12 SURGICAL SUPPLY — 30 items
BAG URO CATCHER STRL LF (MISCELLANEOUS) ×1 IMPLANT
BASKET STONE NCOMPASS (UROLOGICAL SUPPLIES) IMPLANT
CATH URETERAL DUAL LUMEN 10F (MISCELLANEOUS) IMPLANT
CATH URETL OPEN 5X70 (CATHETERS) IMPLANT
CLOTH BEACON ORANGE TIMEOUT ST (SAFETY) ×1 IMPLANT
EXTRACTOR STONE NITINOL NGAGE (UROLOGICAL SUPPLIES) IMPLANT
GLOVE BIO SURGEON STRL SZ 6.5 (GLOVE) IMPLANT
GLOVE BIOGEL PI IND STRL 6 (GLOVE) IMPLANT
GLOVE BIOGEL PI IND STRL 7.0 (GLOVE) IMPLANT
GLOVE BIOGEL PI INDICATOR 6 (GLOVE) ×1
GLOVE BIOGEL PI INDICATOR 7.0 (GLOVE) ×1
GLOVE ECLIPSE 6.0 STRL STRAW (GLOVE) IMPLANT
GLOVE SURG SS PI 8.0 STRL IVOR (GLOVE) ×1 IMPLANT
GOWN STRL REUS W/ TWL LRG LVL3 (GOWN DISPOSABLE) IMPLANT
GOWN STRL REUS W/ TWL XL LVL3 (GOWN DISPOSABLE) ×1 IMPLANT
GOWN STRL REUS W/TWL LRG LVL3 (GOWN DISPOSABLE) ×2
GOWN STRL REUS W/TWL XL LVL3 (GOWN DISPOSABLE) ×1
GUIDEWIRE STR DUAL SENSOR (WIRE) ×1 IMPLANT
IV NS IRRIG 3000ML ARTHROMATIC (IV SOLUTION) ×1 IMPLANT
KIT TURNOVER KIT A (KITS) IMPLANT
LASER FIB FLEXIVA PULSE ID 365 (Laser) IMPLANT
LASER FIB FLEXIVA PULSE ID 550 (Laser) IMPLANT
LASER FIB FLEXIVA PULSE ID 910 (Laser) IMPLANT
MANIFOLD NEPTUNE II (INSTRUMENTS) ×1 IMPLANT
PACK CYSTO (CUSTOM PROCEDURE TRAY) ×1 IMPLANT
SHEATH NAVIGATOR HD 11/13X36 (SHEATH) IMPLANT
TRACTIP FLEXIVA PULS ID 200XHI (Laser) IMPLANT
TRACTIP FLEXIVA PULSE ID 200 (Laser) ×1
TUBING CONNECTING 10 (TUBING) ×1 IMPLANT
TUBING UROLOGY SET (TUBING) ×1 IMPLANT

## 2022-07-12 NOTE — Anesthesia Procedure Notes (Signed)
Procedure Name: LMA Insertion Date/Time: 07/12/2022 4:12 PM  Performed by: Milford Cage, CRNAPre-anesthesia Checklist: Patient identified, Emergency Drugs available, Suction available and Patient being monitored Patient Re-evaluated:Patient Re-evaluated prior to induction Oxygen Delivery Method: Circle system utilized Preoxygenation: Pre-oxygenation with 100% oxygen Induction Type: IV induction Ventilation: Mask ventilation without difficulty LMA: LMA inserted LMA Size: 3.0 Number of attempts: 1 Tube secured with: Tape Dental Injury: Teeth and Oropharynx as per pre-operative assessment

## 2022-07-12 NOTE — Transfer of Care (Signed)
Immediate Anesthesia Transfer of Care Note  Patient: Morgan [name] Amini  Procedure(s) Performed: CYSTOSCOPY WITH RIGHT RETROGRADE PYELOGRAM, URETEROSCOPY HOLMIUM LASER (Right)  Patient Location: PACU  Anesthesia Type:General  Level of Consciousness: awake  Airway & Oxygen Therapy: Patient Spontanous Breathing  Post-op Assessment: Report given to RN and Post -op Vital signs reviewed and stable  Post vital signs: Reviewed and stable  Last Vitals:  Vitals Value Taken Time  BP 93/59 07/12/22 1650  Temp    Pulse 62 07/12/22 1653  Resp 11 07/12/22 1653  SpO2 97 % 07/12/22 1653  Vitals shown include unvalidated device data.  Last Pain:  Vitals:   07/12/22 1420  TempSrc: Oral  PainSc: 5       Patients Stated Pain Goal: 5 (21/62/44 6950)  Complications: No notable events documented.

## 2022-07-12 NOTE — OR Nursing (Signed)
Stone taken by Dr. Wrenn 

## 2022-07-12 NOTE — Op Note (Cosign Needed)
Preoperative Diagnosis: Right distal ureteral calculus  Postoperative Diagnosis:  Same  Procedure(s) Performed:   1. Cystourethroscopy 2. Right ureteroscopic stone extraction with laser lithotripsy 3. Right retrograde pyelogram 4. Intraoperative fluoroscopy with interpretation <1hr.   Teaching Surgeon:  Dr. Irine Seal, MD  Resident Surgeon:  Josph Macho, MD  Anesthesia:  General via LMA  Fluids:  See anesthesia record  Estimated blood loss:  Minimal  Specimens:  Right ureteral calculus for stone analysis  Cultures:  None  Drains:  None  Complications:  None  Indications: 51 y.o. patient with a history of recurrent urolithiasis who presents for elective right ureteroscopic stone extraction for right distal ureteral stone. Risks & benefits of the procedure discussed with the patient, who wishes to proceed.  Findings:   - Cystourethroscopy notable for normal urethra and bladder mucosa. Orthotopic ureteral orifices bilaterally. No masses or stones seen within the bladder. - Right retrograde pyelogram with normal caliber ureter with stone in distal ureter. - Right ureteroscopy notable for yellow stone in distal ureter. This was broken into fragments using a laser. The fragments were removed using a basket and sent for stone analysis.  Description:  The patient was correctly identified in the preop holding area where written informed consent as well potential risk and complication reviewed. The patient agreed. They were brought back to the operative suite where a preinduction timeout was performed. Once correct information was verified, general anesthesia was induced via LMA. They were then gently placed into dorsal lithotomy position with SCDs in place for VTE prophylaxis. They were prepped and draped in the usual sterile fashion and given appropriate preoperative antibiotics with Ancef. A second timeout was then performed.   We inserted a 91F rigid cystoscope per urethra with  copious lubrication and normal saline irrigation running. This demonstrated findings as described above.    We cannulated the right ureteral orifice with a 5Fr open ended catheter and performed a retrograde pyelogram which was significant for the above findings. We then advanced a sensor wire to the renal pelvis under fluoroscopic guidance. We removed the cystoscope leaving the sensor wire in place.  We then advanced a dual channel semirigid ureteroscope and passed this along side the sensor wire and performed distal ureteroscopy which showed the above findings.   We then obtained a 200 micron holmium laser fiber and performed laser lithotripsy until all of the stone burden was fragmented into several small fragments which were basket extracted without complication.  Stone fragments were sent for chemical analysis.  We then re-explored the location of stone treatment which demonstrated no significant remaining stone burden.    At this point we elected to NOT leave a ureteral stent and withdrew our instruments after emptying the bladder.  The patient was woken up from anesthesia and taken to the recovery unit for routine postoperative care.   Post Op Plan:   1. Discharge patient home after she meets PACU criteria. 2. She will follow up with [facility]. We will set this up for her.  Attestation:  Dr. Jeffie Pollock was present and scrubbed for the entirety of the procedure.    Josph Macho, MD Resident, Department of Urology

## 2022-07-12 NOTE — Interval H&P Note (Signed)
History and Physical Interval Note:  She remains symptomatic.   07/12/2022 3:55 PM  Morgan [name]  has presented today for surgery, with the diagnosis of RIGHT DISTAL URETERAL STONE.  The various methods of treatment have been discussed with the patient and family. After consideration of risks, benefits and other options for treatment, the patient has consented to  Procedure(s) with comments: CYSTOSCOPY WITH RIGHT RETROGRADE PYELOGRAM, URETEROSCOPY HOLMIUM LASER AND STENT PLACEMENT (Right) - 60 MINS FOR CASE as a surgical intervention.  The patient's history has been reviewed, patient examined, no change in status, stable for surgery.  I have reviewed the patient's chart and labs.  Questions were answered to the patient's satisfaction.     Irine Seal

## 2022-07-12 NOTE — Discharge Instructions (Signed)

## 2022-07-12 NOTE — Anesthesia Postprocedure Evaluation (Signed)
Anesthesia Post Note  Patient: Morgan [name]  Procedure(s) Performed: CYSTOSCOPY WITH RIGHT RETROGRADE PYELOGRAM, URETEROSCOPY HOLMIUM LASER (Right)     Patient location during evaluation: PACU Anesthesia Type: General Level of consciousness: awake and alert Pain management: pain level controlled Vital Signs Assessment: post-procedure vital signs reviewed and stable Respiratory status: spontaneous breathing, nonlabored ventilation, respiratory function stable and patient connected to nasal cannula oxygen Cardiovascular status: blood pressure returned to baseline and stable Postop Assessment: no apparent nausea or vomiting Anesthetic complications: no   No notable events documented.  Last Vitals:  Vitals:   07/12/22 1730 07/12/22 1745  BP: 92/73 (!) 96/54  Pulse: (!) 56   Resp: 14 20  Temp: 36.4 C   SpO2: 100% 100%    Last Pain:  Vitals:   07/12/22 1730  TempSrc: Oral  PainSc: 0-No pain                 [name]

## 2022-07-12 NOTE — Anesthesia Preprocedure Evaluation (Signed)
Anesthesia Evaluation    Reviewed: Allergy & Precautions, Patient's Chart, lab work & pertinent test results  History of Anesthesia Complications (+) PONV and history of anesthetic complications  Airway Mallampati: II  TM Distance: >3 FB Neck ROM: Full    Dental no notable dental hx.    Pulmonary neg pulmonary ROS,    Pulmonary exam normal        Cardiovascular negative cardio ROS Normal cardiovascular exam     Neuro/Psych  Headaches, PSYCHIATRIC DISORDERS Anxiety Depression TBI (traumatic brain injury)    GI/Hepatic Neg liver ROS, GERD  Medicated and Controlled,  Endo/Other  Hypothyroidism   Renal/GU Renal disease     Musculoskeletal negative musculoskeletal ROS (+)   Abdominal   Peds  Hematology negative hematology ROS (+)   Anesthesia Other Findings RIGHT DISTAL URETERAL STONE  Reproductive/Obstetrics                             Anesthesia Physical Anesthesia Plan  ASA: 2  Anesthesia Plan: General   Post-op Pain Management:    Induction: Intravenous  PONV Risk Score and Plan: 4 or greater and Ondansetron, Dexamethasone, Midazolam, Scopolamine patch - Pre-op and Treatment may vary due to age or medical condition  Airway Management Planned: LMA  Additional Equipment:   Intra-op Plan:   Post-operative Plan: Extubation in OR  Informed Consent: I have reviewed the patients History and Physical, chart, labs and discussed the procedure including the risks, benefits and alternatives for the proposed anesthesia with the patient or authorized representative who has indicated his/her understanding and acceptance.     Dental advisory given  Plan Discussed with: CRNA  Anesthesia Plan Comments:         Anesthesia Quick Evaluation

## 2022-07-12 NOTE — H&P (Signed)
I have ureteral stone.  HPI: Morgan [name] is a 51 year-old female established patient who is here for ureteral stone.  07/10/2022: 51 year old female who presents today due to concerns of severe left-sided flank pain and discomfort is associated with suprapubic pain and pressure and urinary frequency and urgency. Her urine does have microscopic hematuria today. She denies gross hematuria. She states her pain began 1 week ago and is progressively worsening. She is also nauseated.   02/25/2022: Patient taken for ureteroscopy on same day as her last appointment. Obstructing stone identified and successfully basket extracted. A ureteral stent with tether string left at the conclusion of the procedure with patient given instructions to remove the stent on 3/31. She was also provided antimicrobial treatment in the form of cephalexin for bacterial prophylaxis. Now back today for follow-up exam.   She poorly tolerated her stent but upon removal her bothersome symptomology quickly improved. Now no longer having left-sided pain or discomfort suggestive of obstructive uropathy. Still has some lingering frequency and intermittent dysuria but that is improving as well. She has had no further stone material passage or visible blood in the urine. She denies interval fevers or chills, nausea/vomiting post procedure.   02/11/2022: Morgan [name] is a 51 yo female who was seen in the ER on 3/26 for left flank pain and was found on CT to have 2 60m stones in the upper distal ureter on the left with mild hydronephrosis. She is a former patient of the practice and was last seen in 2016 for stones. She has had prior ureteroscopy and her stone was mixed calcium oxalate in the past. She has a history of a gastric bypass. Her BMP, CBC and UA were unremarkable. She is on topomax since at least 2022. She has continues to have pain. She has had nausea without vomiting. She has urgency with hesitancy. She has had no fever. She didn't get  relief with IM toradol or 2 hydrocodone.   The problem is on the right side. This is not her first kidney stone. She is currently having back pain and nausea. She denies having flank pain, groin pain, vomiting, fever, and chills. Pain is occuring on the right side. She has not caught a stone in her urine strainer since her symptoms began.   She has had ureteral stent and ureteroscopy for treatment of her stones in the past.     ALLERGIES: Clindamycin HCl CAPS NSAIDs Percocet TABS    MEDICATIONS: Omeprazole 40 mg capsule,delayed release 1 capsule PO Daily  Tamsulosin Hcl 0.4 mg capsule 1 capsule PO Daily  Aimovig Autoinjector 70 mg/ml auto-injector 70 mg IM Q1M  Amitiza 24 mcg capsule 2 capsule PO Daily  Botox inject q370month Bupropion Xl 300 mg tablet, extended release 24 hr 1 tablet PO Daily  Buspirone Hcl 30 mg tablet 1 tablet PO BID  Diazepam 5 mg tablet 1 tablet PO BID PRN  Flexeril 10 mg tablet 0 Oral  Fluticasone Propionate 50 mcg/actuation spray, suspension 1 ml Spray Daily PRN  Fluvoxamine Maleate 100 mg tablet 2 tablet PO Q HS  Hydroxyzine Hcl 25 mg tablet 1 tablet PO BID  Levothyroxine Sodium 112 mcg capsule 1 capsule PO Q AM  Linzess 290 mcg capsule 1 capsule PO Q AM  Lyrica 100 mg capsule 1 capsule PO Daily  Minipress 2 mg capsule 1 capsule PO Q HS  Multiple Vitamin 1 tablet PO Daily  Multivitamins tablet Oral  Neurontin 400 mg capsule 2 po in the morning and  at lunch and 4 at bedtime  Ondansetron Odt 4 mg tablet,disintegrating 1 tablet PO Q 8 H PRN  Promethazine Hcl 12.5 mg tablet 1 tablet PO Q 6 H PRN  Quetiapine Fumarate 50 mg tablet 1 tablet PO Daily  Singulair 10 mg tablet 1 tablet PO Q HS  Topiramate 100 mg tablet 1 1/2 tablet PO BID  Trazodone Hcl 100 mg tablet 1 1/2 tablet PO Q HS  Zonisamide 50 mg capsule 1 capsule PO Daily  Zyprexa 20 mg tablet 1 tablet PO Q HS     Notes: We have retracted the hydrocodone script from [facility] since they were not able to fill  it. resent to [facility].    GU PSH: Cysto Uretero Lithotripsy - 2016, 2014 Cystoscopy Insert Stent - 2016 Hysterectomy       PSH Notes: Cystoscopy With Ureteroscopy With Lithotripsy, Cystoscopy With Insertion Of Ureteral Stent Left, Gastric Surgery For Morbid Obesity Bypass With Roux-en-Y, Cystoscopy With Ureteroscopy With Lithotripsy   NON-GU PSH: Breast augmentation Cholecystectomy (laparoscopic) Gastric Bypass For Obesity - 2015     GU PMH: Ureteral calculus - 02/25/2022, She has left distal ureteral stones with persistent pain. I discussed continued MET vs URS and she would like to proceed with URS. I have reviewed the risks of bleeding, infectioin, ureteral injury, need for a stent and secondary procedures, thrombotic events and anesthetic complications. , - 02/11/2022, Calculus of ureter, - 2016 Flank Pain - 02/11/2022 Abdominal Pain Unspec, Right flank pain - 2016, Left flank pain, - 2016 Oth GU systems Signs/Symptoms, Bladder pain - 2016 Gross hematuria, Gross hematuria - 2016 Renal calculus, Nephrolithiasis - 2016 Other microscopic hematuria, Microscopic hematuria - 2016 Lower abdominal pain, unspecified, Lower abdominal pain - 2015 Overactive bladder, Overactive bladder - 2014    NON-GU PMH: Encounter for general adult medical examination without abnormal findings, Encounter for preventive health examination - 2016 Personal history of other endocrine, nutritional and metabolic disease, History of hypothyroidism - 2014 Personal history of other specified conditions, History of heartburn - 2014 Anxiety Depression GERD Hypothyroidism Migraine, unspecified, not intractable, without status migrainosus Personal history of traumatic brain injury    FAMILY HISTORY: Blood In Urine - Mother Cancer - Mother Death In The Family Mother - Mother Diabetes - Mother Family Health Status - Father alive at age 66 - Father Family Health Status Number - Runs In Family Heart Disease -  Father Hypertension - Mother, Father nephrolithiasis - Mother renal failure - Mother splenomegaly - Runs In Family   SOCIAL HISTORY: Marital Status: Divorced Preferred Language: English; Race: White Current Smoking Status: Patient has never smoked.   Tobacco Use Assessment Completed: Used Tobacco in last 30 days? Does not use smokeless tobacco. Has never drank.  Drinks 4+ caffeinated drinks per day. Patient's occupation is/was disabled.     Notes: Never A Smoker, Marital History - Currently Married, Caffeine Use, Tobacco Use, Alcohol Use, Occupation:   REVIEW OF SYSTEMS:    GU Review Female:   Patient reports frequent urination, trouble starting your stream, and have to strain to urinate. Patient denies hard to postpone urination, burning /pain with urination, get up at night to urinate, leakage of urine, stream starts and stops, and being pregnant.  Gastrointestinal (Upper):   Patient reports nausea. Patient denies vomiting and indigestion/ heartburn.  Gastrointestinal (Lower):   Patient denies diarrhea and constipation.  Constitutional:   Patient denies fever, night sweats, weight loss, and fatigue.  Skin:   Patient denies skin rash/ lesion and itching.  Eyes:   Patient denies blurred vision and double vision.  Ears/ Nose/ Throat:   Patient denies sore throat and sinus problems.  Hematologic/Lymphatic:   Patient denies swollen glands and easy bruising.  Cardiovascular:   Patient denies leg swelling and chest pains.  Respiratory:   Patient denies cough and shortness of breath.  Endocrine:   Patient denies excessive thirst.  Musculoskeletal:   Patient reports back pain. Patient denies joint pain.  Neurological:   Patient denies headaches and dizziness.  Psychologic:   Patient denies depression and anxiety.   VITAL SIGNS:      07/09/2022 09:23 AM  Weight 115 lb / 52.16 kg  Height 63 in / 160.02 cm  BP 112/65 mmHg  Heart Rate 79 /min  Temperature 97.5 F / 36.3 C  BMI 20.4 kg/m    MULTI-SYSTEM PHYSICAL EXAMINATION:    Constitutional: Well-nourished. No physical deformities. Normally developed. Good grooming.  Cardiovascular: Normal temperature, normal extremity pulses, no swelling, no varicosities.  Skin: No paleness, no jaundice, no cyanosis. No lesion, no ulcer, no rash.  Neurologic / Psychiatric: Oriented to time, oriented to place, oriented to person. No depression, no anxiety, no agitation.  Gastrointestinal: Suprapubic tenderness to palpation.     Complexity of Data:  Source Of History:  Patient  Records Review:   Previous Doctor Records, Previous Patient Records  Urine Test Review:   Urinalysis  X-Ray Review: C.T. Stone Protocol: Reviewed Films. Reviewed Report. Discussed With Patient.     07/09/22  Urinalysis  Urine Appearance Slightly Cloudy   Urine Color Yellow   Urine Glucose Neg mg/dL  Urine Bilirubin Neg mg/dL  Urine Ketones Neg mg/dL  Urine Specific Gravity 1.015   Urine Blood 3+ ery/uL  Urine pH 5.5   Urine Protein Neg mg/dL  Urine Urobilinogen 0.2 mg/dL  Urine Nitrites Neg   Urine Leukocyte Esterase Neg leu/uL  Urine WBC/hpf NS (Not Seen)   Urine RBC/hpf 10 - 20/hpf   Urine Epithelial Cells 0 - 5/hpf   Urine Bacteria NS (Not Seen)   Urine Mucous Present   Urine Yeast NS (Not Seen)   Urine Trichomonas Not Present   Urine Cystals Ca Oxalate   Urine Casts NS (Not Seen)   Urine Sperm Not Present    PROCEDURES:         C.T. Urogram - [id number]      Patient confirmed No Neulasta OnPro Device.         Urinalysis w/Scope Dipstick Dipstick Cont'd Micro  Color: Yellow Bilirubin: Neg mg/dL WBC/hpf: NS (Not Seen)  Appearance: Slightly Cloudy Ketones: Neg mg/dL RBC/hpf: 10 - 20/hpf  Specific Gravity: 1.015 Blood: 3+ ery/uL Bacteria: NS (Not Seen)  pH: 5.5 Protein: Neg mg/dL Cystals: Ca Oxalate  Glucose: Neg mg/dL Urobilinogen: 0.2 mg/dL Casts: NS (Not Seen)    Nitrites: Neg Trichomonas: Not Present    Leukocyte Esterase: Neg leu/uL  Mucous: Present      Epithelial Cells: 0 - 5/hpf      Yeast: NS (Not Seen)      Sperm: Not Present         Ketoralac 41m - [id number] area was prepped and cleaned using sterile technique. A band aid was applied. The pt tolerated well.   Qty: 60 Adm. By: LJairo BenDezantil  Unit: mg Lot No [id number] Route: IM Exp. Date 01/17/2023  Freq: None Mfgr.:   Site: Right Hip   ASSESSMENT:      ICD-10 Details  1 GU:  Ureteral calculus - U04.5 Acute, Uncomplicated   PLAN:            Medications New Meds: Oxybutynin Chloride 5 mg tablet 1 tablet PO BID PRN for bladder spams  #30  0 Refill(s)  Hydrocodone-Acetaminophen 5 mg-325 mg tablet 2 tablet PO Q 6 H PRN for kidney stone pain  #20  0 Refill(s)  Ondansetron Odt 4 mg tablet,disintegrating 1 tablet PO Q 6 H PRN   #20  0 Refill(s)  Pharmacy Name:  [facility] #[id number]  Address:  [address]   [place], [place] [id number]  Phone:  [phone]  Fax:  [phone]    Stop Meds: Hydrocodone-Acetaminophen 5 mg-325 mg tablet 1-2 tablet PO Q 4 H  Start: 02/13/2022  Discontinue: 07/09/2022  - Reason: The medication cycle was completed.            Orders X-Rays: C.T. Stone Protocol Without I.V. Contrast - Left flank pain, severe abdominal pain   X-Ray Notes: History:  Hematuria: Yes/No  Patient to see MD after exam: Yes/No  Previous exam: CT / IVP/ US/ KUB/ None  When:  Where:  Diabetic: Yes/ No  BUN/ Creatinine:  Date of last BUN Creatinine:  Weight in pounds:  Allergy- IV Contrast: Yes/ No  Conflicting diabetic meds: Yes/ No  Diabetic Meds:  Prior Authorization #: [id number]            Schedule Return Visit/Planned Activity: Next Available Appointment - Schedule Surgery  Procedure: 07/09/2022 at [facility] - [phone] - Ketoralac 57m (Toradol Per 15 Mg) - [id number]         Document Letter(s):  Created for Patient: Clinical Summary         Notes:   Urinalysis shows  microscopic hematuria. We advised obtaining CT imaging which shows a right sided distal 2 mm calculus located in either the UVJ or bladder. She would like to undergo ureteroscopy and has been through this before. She was given IM Toradol as well as medication for pain, nausea and bladder spasms.   CT final radiology read is pending. Green sheet for URS was placed.   Urinalysis will be sent for precautionary culture today. Stone intervention was discussed in detail today. For ureteroscopy, the patient understands that there is a chance for a staged procedure. Patient also understands that there is risk for bleeding, infection, injury to surrounding organs, and general risks of anesthesia. The patient also understands the placement of a stent and the risks of stent placement including, risk for infection, the risk for pain, and the risk for injury. For ESWL, the patient understands that there is a chance of failure of procedure, there is also a risk for bruising, infection, bleeding, and injury to surrounding structures. The patient verbalized understanding to these risks.        Next Appointment:      Next Appointment: 07/12/2022 04:00 PM    Appointment Type: Surgery     Location: [facility] -[phone]   Provider: JIrine Seal M.D.    Reason for Visit: OP WL CYSTO RT RGP URS HLL STENT

## 2022-07-13 ENCOUNTER — Encounter (HOSPITAL_COMMUNITY): Payer: Self-pay | Admitting: Urology

## 2022-07-17 ENCOUNTER — Telehealth: Payer: Medicare Other | Admitting: Physician Assistant

## 2022-07-26 ENCOUNTER — Ambulatory Visit: Payer: Medicare Other | Admitting: Physician Assistant

## 2022-07-26 ENCOUNTER — Ambulatory Visit (INDEPENDENT_AMBULATORY_CARE_PROVIDER_SITE_OTHER): Payer: Medicare Other | Admitting: Physician Assistant

## 2022-07-26 ENCOUNTER — Encounter: Payer: Self-pay | Admitting: Physician Assistant

## 2022-07-26 DIAGNOSIS — F4310 Post-traumatic stress disorder, unspecified: Secondary | ICD-10-CM

## 2022-07-26 DIAGNOSIS — E538 Deficiency of other specified B group vitamins: Secondary | ICD-10-CM

## 2022-07-26 DIAGNOSIS — Z630 Problems in relationship with spouse or partner: Secondary | ICD-10-CM

## 2022-07-26 DIAGNOSIS — F411 Generalized anxiety disorder: Secondary | ICD-10-CM | POA: Diagnosis not present

## 2022-07-26 DIAGNOSIS — R079 Chest pain, unspecified: Secondary | ICD-10-CM

## 2022-07-26 DIAGNOSIS — D519 Vitamin B12 deficiency anemia, unspecified: Secondary | ICD-10-CM | POA: Insufficient documentation

## 2022-07-26 DIAGNOSIS — K219 Gastro-esophageal reflux disease without esophagitis: Secondary | ICD-10-CM

## 2022-07-26 DIAGNOSIS — F319 Bipolar disorder, unspecified: Secondary | ICD-10-CM | POA: Diagnosis not present

## 2022-07-26 DIAGNOSIS — G4700 Insomnia, unspecified: Secondary | ICD-10-CM

## 2022-07-26 DIAGNOSIS — F4323 Adjustment disorder with mixed anxiety and depressed mood: Secondary | ICD-10-CM

## 2022-07-26 DIAGNOSIS — R112 Nausea with vomiting, unspecified: Secondary | ICD-10-CM

## 2022-07-26 NOTE — Progress Notes (Signed)
[facility]  Patient ID: Trinda Harlacher,  MRN: [id number]  PCP: Maggie Schwalbe, PA-C  Date of Evaluation: 06/19/2022  Time spent:30 minutes  Chief Complaint:  Chief Complaint   Depression; Anxiety; Insomnia; Follow-up    Virtual Visit via Telehealth  I connected with patient by telephone, with their informed consent, and verified patient privacy and that I am speaking with the correct person using two identifiers.  I am private, in my office and the patient is at home.  I discussed the limitations, risks, security and privacy concerns of performing an evaluation and management service by telephone and the availability of in person appointments. I also discussed with the patient that there may be a patient responsible charge related to this service. The patient expressed understanding and agreed to proceed.   I discussed the assessment and treatment plan with the patient. The patient was provided an opportunity to ask questions and all were answered. The patient agreed with the plan and demonstrated an understanding of the instructions.   The patient was advised to call back or seek an in-person evaluation if the symptoms worsen or if the condition fails to improve as anticipated.  I provided 30 minutes of non-face-to-face time during this encounter.  HISTORY/CURRENT STATUS: HPI For routine med check.  Jenny Reichmann presents for a routine 1 month med check.  She has been under a lot of stress lately.  She had a kidney stone which required cystoscopy and stent placement on 07/10/2022.  She recovered from that fine.  She then had chest pain, multiple test performed, normal EKG and thought to have GERD, given sucralfate and already on a PPI.  She is a little bit better but still has some pain, mostly nausea and vomiting after eating.  She has lost about 10 pounds.  She is having an upper GI and colonoscopy on 07/29/2022.  Has also been started on B12 injections weekly.  Is  really anxious because of everything that is going on.  She is taking the Valium and it does help some.  Not having panic attacks but more of a generalized sense of unease like something bad may happen at any time.  Patient is able to enjoy things.  Energy and motivation are low.  Unable to work.  No extreme sadness, tearfulness, or feelings of hopelessness.  Sleeps well most of the time, with the trazodone.  No reports of nightmares.  We decreased the prazosin dose at the last visit and she is doing better with dizziness.  ADLs and personal hygiene are normal.   Denies any changes in concentration, making decisions, or remembering things.  Denies suicidal or homicidal thoughts.  Patient denies increased energy with decreased need for sleep, increased talkativeness, racing thoughts, impulsivity or risky behaviors, increased spending, increased libido, grandiosity, increased irritability or anger, paranoia, or hallucinations.  Denies dizziness, syncope, seizures, numbness, tingling, tremor, tics, unsteady gait, slurred speech, confusion.  Has chronic headaches, neck and back pain. Denies frequent infections, or sores that heal slowly.  No polyphagia, polydipsia, or polyuria. Denies visual changes or paresthesias.   Individual Medical History/ Review of Systems: Changes? :Yes   see HPI.  Past medications for mental health diagnoses include: Zoloft, trazodone, gabapentin, Risperdal, hydroxyzine, prazosin, nortriptyline, Adderall, Rexulti, Topamax, Latuda, Ingrezza lithium, Valium, clozapine, BuSpar, Zyprexa  Allergies: Clindamycin/lincomycin, Nsaids, Oxycodone, and Percocet [oxycodone-acetaminophen]  Current Medications:  Current Outpatient Medications:    AIMOVIG 70 MG/ML SOAJ, Inject 70 mg into the muscle every 30 (thirty) days., Disp: ,  Rfl: 11   alum & mag hydroxide-simeth (MAALOX MAX) 161-096-04 MG/5ML suspension, Take 15 mLs by mouth every 6 (six) hours as needed for indigestion., Disp: 355 mL,  Rfl: 0   AMITIZA 24 MCG capsule, Take 24 mcg by mouth 2 (two) times daily with a meal. , Disp: , Rfl:    BOTOX 200 units injection, Inject 200 Units into the muscle every 3 (three) months., Disp: , Rfl:    buPROPion (WELLBUTRIN XL) 300 MG 24 hr tablet, Take 1 tablet (300 mg total) by mouth daily., Disp: 90 tablet, Rfl: 1   busPIRone (BUSPAR) 30 MG tablet, Take 1 tablet (30 mg total) by mouth 2 (two) times daily., Disp: 180 tablet, Rfl: 1   Cyanocobalamin (B-12 IJ), Inject 1 Dose as directed once a week., Disp: , Rfl:    cyclobenzaprine (FLEXERIL) 10 MG tablet, Take 1 tablet (10 mg total) by mouth 3 (three) times daily as needed for muscle spasms. (Patient taking differently: Take 10 mg by mouth daily as needed for muscle spasms.), Disp: 270 tablet, Rfl: 0   diazepam (VALIUM) 5 MG tablet, Take 1 tablet (5 mg total) by mouth every 12 (twelve) hours as needed for anxiety., Disp: 60 tablet, Rfl: 2   ENULOSE 10 GM/15ML SOLN, Take 20 g by mouth in the morning and at bedtime., Disp: , Rfl:    fluticasone (FLONASE) 50 MCG/ACT nasal spray, Place 1 spray into both nostrils daily as needed for allergies or rhinitis., Disp: , Rfl:    fluvoxaMINE (LUVOX) 100 MG tablet, Take 2 tablets (200 mg total) by mouth at bedtime., Disp: 180 tablet, Rfl: 1   gabapentin (NEURONTIN) 800 MG tablet, 1 p.o. every morning, 1 p.o. at lunch, 2 p.o. nightly.  May crash and put in applesauce or pudding if unable to swallow tablets., Disp: 360 tablet, Rfl: 0   HYDROcodone-acetaminophen (NORCO/VICODIN) 5-325 MG tablet, Take 2 tablets by mouth every 6 (six) hours as needed (kidney stone pain)., Disp: 20 tablet, Rfl: 0   hydrOXYzine (ATARAX/VISTARIL) 25 MG tablet, Take 1 tablet (25 mg total) by mouth 2 (two) times daily., Disp: 180 tablet, Rfl: 1   ipratropium-albuterol (DUONEB) 0.5-2.5 (3) MG/3ML SOLN, Inhale 3 mLs into the lungs every 6 (six) hours as needed (shortness of breath or wheezing)., Disp: , Rfl:    levothyroxine (SYNTHROID) 88  MCG tablet, Take 88 mcg by mouth daily before breakfast., Disp: , Rfl:    linaclotide (LINZESS) 290 MCG CAPS capsule, Take 290 mcg by mouth daily before breakfast., Disp: , Rfl:    montelukast (SINGULAIR) 10 MG tablet, Take 10 mg by mouth at bedtime. , Disp: , Rfl:    OLANZapine (ZYPREXA) 20 MG tablet, TAKE 1 TABLET BY MOUTH AT  BEDTIME (Patient taking differently: Take 20 mg by mouth at bedtime.), Disp: 90 tablet, Rfl: 3   omeprazole (PRILOSEC) 40 MG capsule, Take 40 mg by mouth in the morning and at bedtime., Disp: , Rfl:    ondansetron (ZOFRAN-ODT) 4 MG disintegrating tablet, Take 4 mg by mouth every 8 (eight) hours as needed for nausea. , Disp: , Rfl:    oxybutynin (DITROPAN) 5 MG tablet, Take 5 mg by mouth 2 (two) times daily., Disp: , Rfl:    prazosin (MINIPRESS) 1 MG capsule, Take 1 capsule (1 mg total) by mouth at bedtime., Disp: 30 capsule, Rfl: 1   predniSONE (DELTASONE) 10 MG tablet, Take 10 mg by mouth daily with breakfast., Disp: , Rfl:    sucralfate (CARAFATE) 1 g tablet,  Take by mouth., Disp: , Rfl:    tamsulosin (FLOMAX) 0.4 MG CAPS capsule, Take 1 capsule (0.4 mg total) by mouth daily., Disp: 30 capsule, Rfl: 0   topiramate (TOPAMAX) 100 MG tablet, take one and one-half tablets by mouth twice a day (Patient taking differently: Take 100 mg by mouth in the morning, at noon, and at bedtime.), Disp: 270 tablet, Rfl: 1   traMADol (ULTRAM) 50 MG tablet, Take 100 mg by mouth every 6 (six) hours as needed for moderate pain., Disp: , Rfl:    traZODone (DESYREL) 100 MG tablet, Take 1-1.5 tablets (100-150 mg total) by mouth at bedtime as needed for sleep., Disp: 135 tablet, Rfl: 1   LYRICA 100 MG capsule, , Disp: , Rfl:    Multiple Vitamin (MULTI VITAMIN) TABS, Take 1 tablet by mouth daily. (Patient not taking: Reported on 07/26/2022), Disp: 90 tablet, Rfl: 1   naproxen (NAPROSYN) 500 MG tablet, Take 1 tablet (500 mg total) by mouth 2 (two) times daily. (Patient not taking: Reported on 07/26/2022),  Disp: 4 tablet, Rfl: 0 Medication Side Effects: none  Family Medical/ Social History: Changes?  No  MENTAL HEALTH EXAM:  There were no vitals taken for this visit.There is no height or weight on file to calculate BMI.    General Appearance:  Unable to assess  Eye Contact:   Unable to assess  Speech:  Clear and Coherent and Normal Rate  Volume:  Normal  Mood:  Anxious  Affect:   Unable to assess  Thought Process:  Goal Directed and Descriptions of Associations: Intact  Orientation:  Full (Time, Place, and Person)  Thought Content: Logical   Suicidal Thoughts:  No  Homicidal Thoughts:  No  Memory:  WNL  Judgement:  Good  Insight:  Good  Psychomotor Activity:   Unable to assess  Concentration:  Concentration: Good and Attention Span: Good  Recall:  Good  Fund of Knowledge: Good  Language: Good  Assets:  Desire for Improvement  ADL's:  Intact  Cognition: WNL  Prognosis:  Good   Labs 07/10/2022 CBC with differential normal CMP glucose 105, BUN 22, calcium 8.6, otherwise normal See other labs that date and on 07/04/2022 on chart.  PCP follows glucose and lipids  DIAGNOSES:    ICD-10-CM   1. Generalized anxiety disorder  F41.1     2. Stress due to marital problems  Z63.0     3. Bipolar I disorder ([place])  F31.9     4. PTSD (post-traumatic stress disorder)  F43.10     5. Situational mixed anxiety and depressive disorder  F43.23     6. Insomnia, unspecified type  G47.00     7. Gastroesophageal reflux disease, unspecified whether esophagitis present  K21.9     8. Nausea and vomiting, unspecified vomiting type  R11.2     9. Chest pain, unspecified type  R07.9     10. B12 deficiency  E53.8       Receiving Psychotherapy: Yes   Jesse Sans  RECOMMENDATIONS:  PDMP was reviewed. Valium filled 06/24/2022.  Hydrocodone known to me. I provided 30 minutes of non-face-to-face time during this encounter, including time spent before and after the visit in records review,  medical decision making, counseling pertinent to today's visit, and charting.   We discussed her symptoms of increased anxiety.  I may need to increase the Valium or even the BuSpar although at usual maximum recommended dose.  Some patients do require a higher dose however.  At  this point, I do not want to make any major changes since she is having the workup on Monday checking for possible anatomic reasons for nausea and vomiting after eating and with the weight loss.  She also sees a cardiologist next week.  There may be new medications or changes in what she is on with those specialist.  It is best to wait and see what they do and then I can make a decision as to whether med changes need to be made.  She verbalizes understanding and agrees.  Continue Wellbutrin XL 300 mg, 1 p.o. every morning. Continue BuSpar 30 mg, 1 p.o. twice daily. Continue Valium 5 mg, 1 p.o. twice daily as needed.   Continue Luvox 100 mg, 2 p.o. nightly. Continue gabapentin 400 mg, 2 p.o. every morning, 2 p.o. at lunch, and 4 p.o. nightly. Continue hydroxyzine 25 mg, 1 p.o. twice daily as needed. Continue Zyprexa 20 mg, 1 p.o. nightly. Continue prazosin 1 mg p.o. nightly. Continue Seroquel 50 mg, 1 p.o. as directed per neuro. Continue Topamax per neuro. Continue trazodone 100 mg, 1.5 pills nightly as needed. Continue counseling. Return in 3-4 weeks.  Donnal Moat, PA-C

## 2022-07-30 ENCOUNTER — Emergency Department (HOSPITAL_BASED_OUTPATIENT_CLINIC_OR_DEPARTMENT_OTHER)
Admission: EM | Admit: 2022-07-30 | Discharge: 2022-07-30 | Disposition: A | Discharge status: Home / Self Care | Payer: Medicare Other | Attending: Emergency Medicine | Admitting: Emergency Medicine

## 2022-07-30 ENCOUNTER — Emergency Department (HOSPITAL_BASED_OUTPATIENT_CLINIC_OR_DEPARTMENT_OTHER): Payer: Medicare Other

## 2022-07-30 ENCOUNTER — Other Ambulatory Visit: Payer: Self-pay

## 2022-07-30 ENCOUNTER — Encounter (HOSPITAL_BASED_OUTPATIENT_CLINIC_OR_DEPARTMENT_OTHER): Payer: Self-pay

## 2022-07-30 DIAGNOSIS — K9189 Other postprocedural complications and disorders of digestive system: Secondary | ICD-10-CM | POA: Diagnosis not present

## 2022-07-30 DIAGNOSIS — K567 Ileus, unspecified: Secondary | ICD-10-CM | POA: Diagnosis not present

## 2022-07-30 DIAGNOSIS — R1084 Generalized abdominal pain: Secondary | ICD-10-CM | POA: Diagnosis present

## 2022-07-30 LAB — URINALYSIS, ROUTINE W REFLEX MICROSCOPIC
Bilirubin Urine: NEGATIVE
Glucose, UA: NEGATIVE mg/dL
Hgb urine dipstick: NEGATIVE
Ketones, ur: NEGATIVE mg/dL
Nitrite: NEGATIVE
Protein, ur: NEGATIVE mg/dL
Specific Gravity, Urine: 1.02 (ref 1.005–1.030)
pH: 6 (ref 5.0–8.0)

## 2022-07-30 LAB — COMPREHENSIVE METABOLIC PANEL
ALT: 19 U/L (ref 0–44)
AST: 24 U/L (ref 15–41)
Albumin: 3.9 g/dL (ref 3.5–5.0)
Alkaline Phosphatase: 103 U/L (ref 38–126)
Anion gap: 5 (ref 5–15)
BUN: 18 mg/dL (ref 6–20)
CO2: 23 mmol/L (ref 22–32)
Calcium: 8.6 mg/dL — ABNORMAL LOW (ref 8.9–10.3)
Chloride: 112 mmol/L — ABNORMAL HIGH (ref 98–111)
Creatinine, Ser: 1.01 mg/dL — ABNORMAL HIGH (ref 0.44–1.00)
GFR, Estimated: >60 mL/min (ref >60)
Glucose, Bld: 119 mg/dL — ABNORMAL HIGH (ref 70–99)
Potassium: 3.3 mmol/L — ABNORMAL LOW (ref 3.5–5.1)
Sodium: 140 mmol/L (ref 135–145)
Total Bilirubin: 0.6 mg/dL (ref 0.3–1.2)
Total Protein: 6.5 g/dL (ref 6.5–8.1)

## 2022-07-30 LAB — CBC
HCT: 36.1 % (ref 36.0–46.0)
Hemoglobin: 11.4 g/dL — ABNORMAL LOW (ref 12.0–15.0)
MCH: 27 pg (ref 26.0–34.0)
MCHC: 31.6 g/dL (ref 30.0–36.0)
MCV: 85.5 fL (ref 80.0–100.0)
Platelets: 195 10*3/uL (ref 150–400)
RBC: 4.22 MIL/uL (ref 3.87–5.11)
RDW: 13.7 % (ref 11.5–15.5)
WBC: 4.4 10*3/uL (ref 4.0–10.5)
nRBC: 0 % (ref 0.0–0.2)

## 2022-07-30 LAB — LIPASE, BLOOD: Lipase: 42 U/L (ref 11–51)

## 2022-07-30 LAB — URINALYSIS, MICROSCOPIC (REFLEX): RBC / HPF: NONE SEEN RBC/hpf (ref 0–5)

## 2022-07-30 MED ORDER — SODIUM CHLORIDE 0.9 % IV BOLUS
1000.0000 mL | Freq: Once | INTRAVENOUS | Status: AC
  Administered 2022-07-30: 1000 mL via INTRAVENOUS

## 2022-07-30 MED ORDER — IOHEXOL 300 MG/ML  SOLN
100.0000 mL | Freq: Once | INTRAMUSCULAR | Status: AC | PRN
  Administered 2022-07-30: 100 mL via INTRAVENOUS

## 2022-07-30 MED ORDER — ALUM & MAG HYDROXIDE-SIMETH 200-200-20 MG/5ML PO SUSP
30.0000 mL | Freq: Once | ORAL | Status: AC
  Administered 2022-07-30: 30 mL via ORAL
  Filled 2022-07-30: qty 30

## 2022-07-30 MED ORDER — MORPHINE SULFATE (PF) 4 MG/ML IV SOLN
4.0000 mg | Freq: Once | INTRAVENOUS | Status: AC
  Administered 2022-07-30: 4 mg via INTRAVENOUS
  Filled 2022-07-30: qty 1

## 2022-07-30 MED ORDER — ONDANSETRON 4 MG PO TBDP
4.0000 mg | ORAL_TABLET | Freq: Once | ORAL | Status: AC | PRN
Start: 2022-07-30 — End: 2022-07-30
  Administered 2022-07-30: 4 mg via ORAL
  Filled 2022-07-30: qty 1

## 2022-07-30 MED ORDER — HYDROMORPHONE HCL 1 MG/ML IJ SOLN
1.0000 mg | Freq: Once | INTRAMUSCULAR | Status: AC
  Administered 2022-07-30: 1 mg via INTRAVENOUS
  Filled 2022-07-30: qty 1

## 2022-07-30 MED ORDER — HYDROCODONE-ACETAMINOPHEN 5-325 MG PO TABS: 1.0000 | ORAL_TABLET | Freq: Four times a day (QID) | ORAL | 0 refills | Status: DC | PRN

## 2022-07-30 MED ORDER — ONDANSETRON HCL 4 MG/2ML IJ SOLN
INTRAMUSCULAR | Status: AC
  Filled 2022-07-30: qty 2

## 2022-07-30 NOTE — ED Provider Notes (Signed)
[place] EMERGENCY DEPARTMENT Provider Note   CSN: [id number] Arrival date & time: 07/30/22  2025     History  Chief Complaint  Patient presents with   Abdominal Pain    Morgan [name] is a 51 y.o. female.  51 year old female who presented to emergency department for evaluation of abdominal pain. Patient complaining of diffuse abdominal pain associate with nausea since yesterday.  Pain is constant, sharp in nature, nonradiating associated with nausea.  Patient denies vomiting, fever or chills.  Patient had a colonoscopy and endoscopy yesterday.  Patient was discharged home in a stable condition after the procedure.  Patient reports pain gradually got worse.  Pain worsens with deep breathing and movement.  Patient have called the surgeon's office with her increasing abdominal pain.  Patient was advised to go to emergency department for further evaluation and management. Upon arrival patient is in moderate distress secondary to pain.  Patient rated pain 10 out of 10.  She did not had a bowel movement since the procedure. Her vital signs are stable.  Patient is afebrile  The history is provided by the patient. No language interpreter was used.  Abdominal Pain Pain location:  Generalized Pain quality: sharp   Pain radiates to:  Does not radiate Pain severity:  Severe Duration:  1 day Timing:  Constant Progression:  Worsening      Home Medications Prior to Admission medications   Medication Sig Start Date End Date Taking? Authorizing Provider  HYDROcodone-acetaminophen (NORCO/VICODIN) 5-325 MG tablet Take 1 tablet by mouth every 6 (six) hours as needed. 07/30/22  Yes Drenda Freeze, MD  AIMOVIG 70 MG/ML SOAJ Inject 70 mg into the muscle every 30 (thirty) days. 10/13/17   [provider]  alum & mag hydroxide-simeth (MAALOX MAX) 400-400-40 MG/5ML suspension Take 15 mLs by mouth every 6 (six) hours as needed for indigestion. 07/10/22   Kommor, Debe Coder,  MD  AMITIZA 24 MCG capsule Take 24 mcg by mouth 2 (two) times daily with a meal.  09/24/17   [provider]  BOTOX 200 units injection Inject 200 Units into the muscle every 3 (three) months. 06/24/22   [provider]  buPROPion (WELLBUTRIN XL) 300 MG 24 hr tablet Take 1 tablet (300 mg total) by mouth daily. 06/19/22   Donnal Moat T, PA-C  busPIRone (BUSPAR) 30 MG tablet Take 1 tablet (30 mg total) by mouth 2 (two) times daily. 06/19/22   Donnal Moat T, PA-C  Cyanocobalamin (B-12 IJ) Inject 1 Dose as directed once a week.    [provider]  cyclobenzaprine (FLEXERIL) 10 MG tablet Take 1 tablet (10 mg total) by mouth 3 (three) times daily as needed for muscle spasms. Patient taking differently: Take 10 mg by mouth daily as needed for muscle spasms. 11/04/18   Shugart, Lissa Hoard, PA-C  diazepam (VALIUM) 5 MG tablet Take 1 tablet (5 mg total) by mouth every 12 (twelve) hours as needed for anxiety. 03/18/22   Donnal Moat T, PA-C  ENULOSE 10 GM/15ML SOLN Take 20 g by mouth in the morning and at bedtime. 05/09/22   [provider]  fluticasone (FLONASE) 50 MCG/ACT nasal spray Place 1 spray into both nostrils daily as needed for allergies or rhinitis.    [provider]  fluvoxaMINE (LUVOX) 100 MG tablet Take 2 tablets (200 mg total) by mouth at bedtime. 06/19/22   Donnal Moat T, PA-C  gabapentin (NEURONTIN) 800 MG tablet 1 p.o. every morning, 1 p.o. at lunch, 2  p.o. nightly.  May crash and put in applesauce or pudding if unable to swallow tablets. 06/12/22   Addison Lank, PA-C  HYDROcodone-acetaminophen (NORCO/VICODIN) 5-325 MG tablet Take 2 tablets by mouth every 6 (six) hours as needed (kidney stone pain). 07/12/22   Irine Seal, MD  hydrOXYzine (ATARAX/VISTARIL) 25 MG tablet Take 1 tablet (25 mg total) by mouth 2 (two) times daily. 07/27/19   Mozingo, Berdie Ogren, NP  ipratropium-albuterol (DUONEB) 0.5-2.5 (3) MG/3ML SOLN Inhale 3 mLs into the lungs every 6 (six)  hours as needed (shortness of breath or wheezing).    [provider]  levothyroxine (SYNTHROID) 88 MCG tablet Take 88 mcg by mouth daily before breakfast.    [provider]  linaclotide (LINZESS) 290 MCG CAPS capsule Take 290 mcg by mouth daily before breakfast.    [provider]  LYRICA 100 MG capsule  10/23/17   [provider]  montelukast (SINGULAIR) 10 MG tablet Take 10 mg by mouth at bedtime.  02/05/16   [provider]  Multiple Vitamin (MULTI VITAMIN) TABS Take 1 tablet by mouth daily. Patient not taking: Reported on 07/26/2022 09/23/19   Donnal Moat T, PA-C  naproxen (NAPROSYN) 500 MG tablet Take 1 tablet (500 mg total) by mouth 2 (two) times daily. Patient not taking: Reported on 07/26/2022 04/21/22   Varney Biles, MD  OLANZapine (ZYPREXA) 20 MG tablet TAKE 1 TABLET BY MOUTH AT  BEDTIME Patient taking differently: Take 20 mg by mouth at bedtime. 03/08/22   Addison Lank, PA-C  omeprazole (PRILOSEC) 40 MG capsule Take 40 mg by mouth in the morning and at bedtime. 07/29/17   [provider]  ondansetron (ZOFRAN-ODT) 4 MG disintegrating tablet Take 4 mg by mouth every 8 (eight) hours as needed for nausea.  02/05/16   [provider]  oxybutynin (DITROPAN) 5 MG tablet Take 5 mg by mouth 2 (two) times daily. 07/09/22   [provider]  prazosin (MINIPRESS) 1 MG capsule Take 1 capsule (1 mg total) by mouth at bedtime. 06/19/22   Addison Lank, PA-C  predniSONE (DELTASONE) 10 MG tablet Take 10 mg by mouth daily with breakfast. 10/23/17   [provider]  sucralfate (CARAFATE) 1 g tablet Take by mouth. 07/16/22   [provider]  tamsulosin (FLOMAX) 0.4 MG CAPS capsule Take 1 capsule (0.4 mg total) by mouth daily. 02/10/22   Myna Bright M, PA-C  topiramate (TOPAMAX) 100 MG tablet take one and one-half tablets by mouth twice a day Patient taking differently: Take 100 mg by mouth in the morning, at noon, and at  bedtime. 01/30/22   Donnal Moat T, PA-C  traMADol (ULTRAM) 50 MG tablet Take 100 mg by mouth every 6 (six) hours as needed for moderate pain.    [provider]  traZODone (DESYREL) 100 MG tablet Take 1-1.5 tablets (100-150 mg total) by mouth at bedtime as needed for sleep. 06/19/22   Addison Lank, PA-C      Allergies    Clindamycin/lincomycin, Nsaids, Oxycodone, and Percocet [oxycodone-acetaminophen]    Review of Systems   Review of Systems  Gastrointestinal:  Positive for abdominal pain.    Physical Exam Updated Vital Signs BP 117/66   Pulse (!) 56   Temp 98.3 F (36.8 C)   Resp 17   Ht '5\' 3"'$  (1.6 m)   Wt 56.7 kg   SpO2 100%   BMI 22.14 kg/m  Physical Exam Vitals and nursing note reviewed.  Constitutional:  Appearance: She is well-developed.  Pulmonary:     Effort: Pulmonary effort is normal.     Breath sounds: Normal breath sounds.  Abdominal:     General: Bowel sounds are increased. There is distension.     Palpations: Abdomen is soft. There is no mass or pulsatile mass.     Tenderness: There is generalized abdominal tenderness (Diffusely tender to percussion and palpation.Pt is guarding to percussion and palpation.  Unable to get a good abdominal exam secondary to pain.). There is guarding.     Hernia: No hernia is present.  Neurological:     Mental Status: She is alert.     ED Results / Procedures / Treatments   Labs (all labs ordered are listed, but only abnormal results are displayed) Labs Reviewed  COMPREHENSIVE METABOLIC PANEL - Abnormal; Notable for the following components:      Result Value   Potassium 3.3 (*)    Chloride 112 (*)    Glucose, Bld 119 (*)    Creatinine, Ser 1.01 (*)    Calcium 8.6 (*)    All other components within normal limits  CBC - Abnormal; Notable for the following components:   Hemoglobin 11.4 (*)    All other components within normal limits  URINALYSIS, ROUTINE W REFLEX MICROSCOPIC - Abnormal; Notable for the  following components:   Leukocytes,Ua MODERATE (*)    All other components within normal limits  URINALYSIS, MICROSCOPIC (REFLEX) - Abnormal; Notable for the following components:   Bacteria, UA RARE (*)    All other components within normal limits  LIPASE, BLOOD    EKG EKG Interpretation  Date/Time:  Tuesday July 30 2022 20:48:55 EDT Ventricular Rate:  70 PR Interval:  181 QRS Duration: 86 QT Interval:  378 QTC Calculation: 408 R Axis:   87 Text Interpretation: Sinus rhythm No significant change since last tracing Confirmed by Wandra Arthurs [phone]) on 07/30/2022 8:56:55 PM  Radiology CT ABDOMEN PELVIS W CONTRAST  Result Date: 07/30/2022 CLINICAL DATA:  Abdominal pain EXAM: CT ABDOMEN AND PELVIS WITH CONTRAST TECHNIQUE: Multidetector CT imaging of the abdomen and pelvis was performed using the standard protocol following bolus administration of intravenous contrast. RADIATION DOSE REDUCTION: This exam was performed according to the departmental dose-optimization program which includes automated exposure control, adjustment of the mA and/or kV according to patient size and/or use of iterative reconstruction technique. CONTRAST:  138m OMNIPAQUE IOHEXOL 300 MG/ML  SOLN COMPARISON:  CT 02/10/2022, 07/09/2022, 05/01/2020 FINDINGS: Lower chest: Lung bases demonstrate no acute airspace disease. Normal cardiac size. Bilateral breast implants. Hepatobiliary: Status post cholecystectomy. No focal hepatic abnormality. Similar appearance of intra hepatic biliary dilatation. Stable common duct diameter. Pancreas: Unremarkable. No pancreatic ductal dilatation or surrounding inflammatory changes. Spleen: Normal in size without focal abnormality. Adrenals/Urinary Tract: Adrenal glands are unremarkable. Kidneys are normal, without renal calculi, focal lesion, or hydronephrosis. Bladder is unremarkable. Stomach/Bowel: Status post gastric bypass surgery. No evidence for bowel obstruction or bowel wall  thickening. Negative appendix. Vascular/Lymphatic: No significant vascular findings are present. No enlarged abdominal or pelvic lymph nodes. Reproductive: Status post hysterectomy. No adnexal masses. Other: Negative for pelvic effusion or free air. Musculoskeletal: No acute or significant osseous findings. IMPRESSION: 1. No CT evidence for acute intra-abdominal or pelvic abnormality. 2. Previous gastric bypass without evidence for bowel wall thickening or obstruction. Electronically Signed   By: KDonavan FoilM.D.   On: 07/30/2022 21:42    Procedures Procedures    Medications Ordered in ED Medications  ondansetron (ZOFRAN) 4 MG/2ML injection (  Not Given 07/30/22 2050)  ondansetron (ZOFRAN-ODT) disintegrating tablet 4 mg (4 mg Oral Given 07/30/22 2041)  sodium chloride 0.9 % bolus 1,000 mL ( Intravenous Stopped 07/30/22 2220)  morphine (PF) 4 MG/ML injection 4 mg (4 mg Intravenous Given 07/30/22 2102)  iohexol (OMNIPAQUE) 300 MG/ML solution 100 mL (100 mLs Intravenous Contrast Given 07/30/22 2112)  HYDROmorphone (DILAUDID) injection 1 mg (1 mg Intravenous Given 07/30/22 2201)  alum & mag hydroxide-simeth (MAALOX/MYLANTA) 200-200-20 MG/5ML suspension 30 mL (30 mLs Oral Given 07/30/22 2306)    ED Course/ Medical Decision Making/ A&P                           Medical Decision Making 51 year old female presented to emergency department for evaluation of abdominal pain. Patient reports abdominal pain started yesterday.  Pain is constant, severe/sharp, non radiating associated with nausea.  Patient had a colonoscopy and endoscopy procedure yesterday.  Patient was discharged home after the procedure.  Patient reports the pain gradually got worse.  She is able to urinate.Denies back pain, increase urinary frequency or burning with urination.  Patient appears in moderate distress secondary to pain.  Her vital signs are stable.  Patient is afebrile. Abdomen diffusely tender to palpation and percussion.   Patient is guarding to both light palpation and percussion secondary to pain.  Bowel sounds hyperactive diffusely.  No visible pulsation or mass appreciated.  Differential diagnosis includes bowel perforation, ileus, constipation. we will do CT abdomen with contrast to rule out the same. She will get morphine 4 mg to help with the pain..  CT abdomen negative for acute intra-abdominal or pelvic abnormalities. I have discussed the findings with the patient.  Patient continues to complain of abdominal pain. She will get Dilaudid 1 mg for pain.  I will reevaluate her for the pain after some time. I have reevaluated the patient.  Patient reports pain down to 2 out of 10.  She is feeling comfortable.  Patient complains of mild reflux and requested something to help with the GERD.  Patient will get GI cocktail for symptomatic relief. Plan is to discharge patient home with education and instructions to follow-up with the surgeon tomorrow.  Have appointment scheduled to see the surgeon tomorrow.  Patient will keep that appointment. Patient will get a prescription of pain medication.  She will increase hydration and use MiraLAX.  Patient in no distress and overall condition improved here in the ED. Detailed discussions were had with the patient regarding current findings, and need for close f/u with PCP or on call doctor. The patient has been instructed to return immediately if the symptoms worsen in any way for re-evaluation. Patient verbalized understanding and is in agreement with current care plan. All questions answered prior to discharge.     Amount and/or Complexity of Data Reviewed Labs: ordered. Radiology: ordered.  Risk Prescription drug management.          Final Clinical Impression(s) / ED Diagnoses Final diagnoses:  Generalized abdominal pain  Ileus, postoperative ([place])    Rx / DC Orders ED Discharge Orders          Ordered    HYDROcodone-acetaminophen (NORCO/VICODIN)  5-325 MG tablet  Every 6 hours PRN        07/30/22 2303              Teola Bradley, MD 07/30/22 2319    Drenda Freeze, MD 08/02/22 2258  Drenda Freeze, MD 08/02/22 [phone]

## 2022-07-30 NOTE — Discharge Instructions (Signed)
Patient with follow-up with surgeon tomorrow for further evaluation recommendations. Prescription for pain medication is sent to the pharmacy on file. Increase hydration and stool softener.

## 2022-07-30 NOTE — ED Triage Notes (Signed)
Pt had colonoscopy Monday and is having acute abd pain; reports difficulty breathing due to pain. Pt has app tomorrow in office, but was told to come here is pain was unbearable. Denies diarrhea/constipation; endorses mild nausea. Pain is limited to lower abd; feels like she got kicked in lower stomach.

## 2022-07-30 NOTE — ED Notes (Signed)
Patient transported to CT 

## 2022-07-31 ENCOUNTER — Inpatient Hospital Stay: Payer: Medicare Other

## 2022-07-31 ENCOUNTER — Other Ambulatory Visit: Payer: Self-pay | Admitting: Oncology

## 2022-07-31 ENCOUNTER — Encounter: Payer: Self-pay | Admitting: Oncology

## 2022-07-31 ENCOUNTER — Inpatient Hospital Stay: Payer: Medicare Other | Attending: Oncology | Admitting: Oncology

## 2022-07-31 VITALS — BP 103/68 | HR 69 | Temp 97.5°F | Resp 18 | Ht 64.0 in | Wt 129.1 lb

## 2022-07-31 DIAGNOSIS — D509 Iron deficiency anemia, unspecified: Secondary | ICD-10-CM | POA: Insufficient documentation

## 2022-07-31 DIAGNOSIS — E876 Hypokalemia: Secondary | ICD-10-CM

## 2022-07-31 DIAGNOSIS — D508 Other iron deficiency anemias: Secondary | ICD-10-CM

## 2022-07-31 DIAGNOSIS — D518 Other vitamin B12 deficiency anemias: Secondary | ICD-10-CM

## 2022-07-31 DIAGNOSIS — N201 Calculus of ureter: Secondary | ICD-10-CM

## 2022-07-31 DIAGNOSIS — D500 Iron deficiency anemia secondary to blood loss (chronic): Secondary | ICD-10-CM

## 2022-08-01 ENCOUNTER — Other Ambulatory Visit: Payer: Self-pay | Admitting: Pharmacist

## 2022-08-01 DIAGNOSIS — E876 Hypokalemia: Secondary | ICD-10-CM | POA: Insufficient documentation

## 2022-08-06 ENCOUNTER — Telehealth: Payer: Self-pay | Admitting: Oncology

## 2022-08-06 NOTE — Telephone Encounter (Signed)
08/06/22 Spoke with patient and rescheduled IV IRON.

## 2022-08-07 ENCOUNTER — Encounter: Payer: Self-pay | Admitting: Oncology

## 2022-08-08 ENCOUNTER — Ambulatory Visit: Payer: Medicare Other

## 2022-08-12 MED FILL — Ferumoxytol Inj 510 MG/17ML (30 MG/ML) (Elemental Fe): INTRAVENOUS | Qty: 17 | Status: AC

## 2022-08-13 ENCOUNTER — Ambulatory Visit: Payer: Medicare Other

## 2022-08-15 ENCOUNTER — Other Ambulatory Visit: Payer: Self-pay | Admitting: Physician Assistant

## 2022-08-16 ENCOUNTER — Ambulatory Visit: Payer: Medicare Other

## 2022-08-16 ENCOUNTER — Encounter: Payer: Self-pay | Admitting: Oncology

## 2022-08-16 ENCOUNTER — Other Ambulatory Visit: Payer: Self-pay | Admitting: Pharmacist

## 2022-08-16 MED FILL — Ferumoxytol Inj 510 MG/17ML (30 MG/ML) (Elemental Fe): INTRAVENOUS | Qty: 17 | Status: AC

## 2022-08-16 NOTE — Progress Notes (Signed)
[facility]  Date:  07/31/2022  Established Patient Office Visit  Subjective   Patient ID: Morgan [name], female    DOB: Feb 05, 1971  Age: 51 y.o. MRN: [id number]  Chief Complaint  Patient presents with   Follow-up  Chief complaint: Anemia  Treatment: IV iron  HPI This is a 51 year old woman who I last saw in June 2021 for evaluation and treatment of anemia.  She was found to have iron deficiency but was unable to tolerate oral iron.  She had no evidence of bleeding but does have a history of gastric bypass and so is unable to absorb the iron properly.  We administered IV Feraheme in January and February of that year.  She had colonoscopy and EGD and September 2019.  We also tested her for hemolytic anemia and this was negative serum protein electrophoresis showed no monoclonal spike and ANA was negative her hemoglobin came up nicely from 9.4 with an MCV of 69 to 13.6 in June 2021 with an MCV of 87.  She was also found to have acutely severely abnormal liver function tests and evaluation was negative but she does have a family history of cirrhosis and so we did check anti-smooth muscle antibodies, which were negative as well.  She was referred to [facility] gastroenterology and they followed up on this problem.  She did have colonoscopy and EGD by Dr. Coralie Keens on September 11 and these were good.  It was felt she could have a repeat colonoscopy in 10 years.  Interval history The patient recently had labs done by her primary care provider, Ray Nodal, and he found her to be very iron deficient with a ferritin of 4 and an iron saturation of 10%.  Her B12 level was low at 165.  He has already started her on B12 injections but she comes in to discuss IV iron.  She did have a recent colonoscopy and upper endoscopy.  She went to the emergency room last night with burning of her throat and stomach.  CT scan was clear.  They could see evidence of her prior bypass.  The  burning in her chest was so severe that she thought it was a heart attack.  I suggested that we try Carafate and it turns out that this has already been prescribed for her and I encouraged her to take it.  She is on omeprazole as well as Prevacid AC and Pepcid.  She does complain of nausea and poor appetite.  She has lost about 8-1/2 pounds since I last saw her over 2 years ago.  She denies hemoptysis, cough, dyspnea or chest pain at present.  She denies vomiting, bowel problems, hematochezia, melena, or hematemesis.  She does not complain of abdominal pain.  She does complain of fatigue.  Labs today show that her hemoglobin has dropped from 13.1 in March to 12.2 in August and now 11.4 with an MCV of 85.5.  CMP reveals a nonfasting blood sugar 119, low calcium of 8.6, and low potassium of 3.3.  Review of Systems  Constitutional:  Positive for malaise/fatigue and weight loss.  HENT:  Positive for sore throat.   Eyes: Negative.   Respiratory: Negative.    Cardiovascular:  Positive for chest pain.  Gastrointestinal:  Positive for nausea.  Genitourinary: Negative.   Musculoskeletal: Negative.   Skin: Negative.   Neurological: Negative.   Endo/Heme/Allergies: Negative.   Psychiatric/Behavioral: Negative.       Objective:     BP  103/68 (BP Location: Right Arm, Patient Position: Sitting)   Pulse 69   Temp (!) 97.5 F (36.4 C) (Oral)   Resp 18   Ht '5\' 4"'$  (1.626 m)   Wt 129 lb 1.6 oz (58.6 kg)   SpO2 100%   BMI 22.16 kg/m    Physical Exam Vitals and nursing note reviewed.  Constitutional:      Appearance: Normal appearance.  HENT:     Head: Normocephalic and atraumatic.     Nose: Nose normal.     Mouth/Throat:     Pharynx: Oropharynx is clear.  Eyes:     Extraocular Movements: Extraocular movements intact.     Conjunctiva/sclera: Conjunctivae normal.     Pupils: Pupils are equal, round, and reactive to light.  Cardiovascular:     Rate and Rhythm: Normal rate and regular rhythm.      Heart sounds: Normal heart sounds.  Pulmonary:     Effort: Pulmonary effort is normal.     Breath sounds: Normal breath sounds.  Abdominal:     Palpations: Abdomen is soft.  Musculoskeletal:        General: Normal range of motion.     Cervical back: Normal range of motion.  Skin:    General: Skin is warm and dry.  Neurological:     General: No focal deficit present.     Mental Status: She is alert and oriented to person, place, and time.  Psychiatric:        Mood and Affect: Mood normal.        Behavior: Behavior normal.        Thought Content: Thought content normal.        Judgment: Judgment normal.    No results found for any visits on 07/31/22.  Last CBC Lab Results  Component Value Date   WBC 4.4 07/30/2022   HGB 11.4 (L) 07/30/2022   HCT 36.1 07/30/2022   MCV 85.5 07/30/2022   MCH 27.0 07/30/2022   RDW 13.7 07/30/2022   PLT 195 89/21/1943   Last metabolic panel Lab Results  Component Value Date   GLUCOSE 119 (H) 07/30/2022   NA 140 07/30/2022   K 3.3 (L) 07/30/2022   CL 112 (H) 07/30/2022   CO2 23 07/30/2022   BUN 18 07/30/2022   CREATININE 1.01 (H) 07/30/2022   GFRNONAA >60 07/30/2022   CALCIUM 8.6 (L) 07/30/2022   PROT 6.5 07/30/2022   ALBUMIN 3.9 07/30/2022   BILITOT 0.6 07/30/2022   ALKPHOS 103 07/30/2022   AST 24 07/30/2022   ALT 19 07/30/2022   ANIONGAP 5 07/30/2022         Assessment & Plan:   Iron deficiency anemia This is recurrent 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  She does not tolerate oral iron and so we will arrange for her to get IV iron again.   B12 deficiency She is already on B12 injections.  She may have a lack of intrinsic factor with her gastric bypass and so we will probably need to stay on that lifelong.  Hypokalemia I do think that she would have difficulty tolerating oral potassium and so I have recommended she increase potassium in her diet and gave her a list of foods that are high in potassium.  History  of gastric bypass.   We will arrange for IV iron and I have recommended that she increase potassium in her diet.  Her calcium is also mildly low and so I have recommended increasing calcium in  her diet and taking Tums.  I can see her back in 2 months with CBC and CMP to see if these abnormalities are corrected.  Most likely we can then have Raynaud's I will continue to follow her and let us know when she might need IV iron.  I suspect she will need it from time to time.  She had it in 2019 and again in 2021 and now requires it again.  I have answered her questions and she understands and agrees with this plan of care.  Return in about 2 months (around 09/30/2022).    Derwood Kaplan, MD

## 2022-08-19 ENCOUNTER — Inpatient Hospital Stay: Payer: Medicare Other

## 2022-08-20 ENCOUNTER — Ambulatory Visit: Payer: Medicare Other | Admitting: Physician Assistant

## 2022-08-23 ENCOUNTER — Other Ambulatory Visit: Payer: Self-pay | Admitting: Physician Assistant

## 2022-08-23 ENCOUNTER — Encounter: Payer: Self-pay | Admitting: Oncology

## 2022-08-23 MED FILL — Ferumoxytol Inj 510 MG/17ML (30 MG/ML) (Elemental Fe): INTRAVENOUS | Qty: 17 | Status: AC

## 2022-08-23 NOTE — Telephone Encounter (Signed)
Pt APT 11/1 on canc list for sooner

## 2022-08-26 ENCOUNTER — Inpatient Hospital Stay: Payer: Medicare Other | Attending: Oncology

## 2022-08-26 VITALS — BP 105/61 | HR 82 | Temp 98.0°F | Resp 18 | Ht 64.0 in | Wt 130.0 lb

## 2022-08-26 DIAGNOSIS — D509 Iron deficiency anemia, unspecified: Secondary | ICD-10-CM | POA: Insufficient documentation

## 2022-08-26 DIAGNOSIS — Z79899 Other long term (current) drug therapy: Secondary | ICD-10-CM | POA: Diagnosis not present

## 2022-08-26 DIAGNOSIS — Z23 Encounter for immunization: Secondary | ICD-10-CM | POA: Insufficient documentation

## 2022-08-26 MED ORDER — ACETAMINOPHEN 325 MG PO TABS
650.0000 mg | ORAL_TABLET | Freq: Once | ORAL | Status: AC
  Administered 2022-08-26: 650 mg via ORAL
  Filled 2022-08-26: qty 2

## 2022-08-26 MED ORDER — SODIUM CHLORIDE 0.9 % IV SOLN
510.0000 mg | Freq: Once | INTRAVENOUS | Status: AC
  Administered 2022-08-26: 510 mg via INTRAVENOUS
  Filled 2022-08-26: qty 510

## 2022-08-26 MED ORDER — SODIUM CHLORIDE 0.9 % IV SOLN: Freq: Once | INTRAVENOUS | Status: AC

## 2022-08-26 MED ORDER — FAMOTIDINE IN NACL 20-0.9 MG/50ML-% IV SOLN
20.0000 mg | Freq: Once | INTRAVENOUS | Status: AC
  Administered 2022-08-26: 20 mg via INTRAVENOUS
  Filled 2022-08-26: qty 50

## 2022-08-26 NOTE — Patient Instructions (Signed)

## 2022-08-30 ENCOUNTER — Other Ambulatory Visit: Payer: Self-pay | Admitting: Pharmacist

## 2022-08-31 ENCOUNTER — Other Ambulatory Visit: Payer: Self-pay | Admitting: Physician Assistant

## 2022-09-02 ENCOUNTER — Ambulatory Visit: Payer: Medicare Other

## 2022-09-02 MED FILL — Ferumoxytol Inj 510 MG/17ML (30 MG/ML) (Elemental Fe): INTRAVENOUS | Qty: 17 | Status: AC

## 2022-09-03 ENCOUNTER — Inpatient Hospital Stay: Payer: Medicare Other

## 2022-09-03 VITALS — BP 110/58 | HR 77 | Temp 97.9°F | Resp 18 | Wt 125.0 lb

## 2022-09-03 DIAGNOSIS — D509 Iron deficiency anemia, unspecified: Secondary | ICD-10-CM | POA: Diagnosis not present

## 2022-09-03 MED ORDER — SODIUM CHLORIDE 0.9 % IV SOLN
510.0000 mg | Freq: Once | INTRAVENOUS | Status: AC
  Administered 2022-09-03: 510 mg via INTRAVENOUS
  Filled 2022-09-03: qty 510

## 2022-09-03 MED ORDER — INFLUENZA VAC SPLIT QUAD 0.5 ML IM SUSY
0.5000 mL | PREFILLED_SYRINGE | Freq: Once | INTRAMUSCULAR | Status: AC
  Administered 2022-09-03: 0.5 mL via INTRAMUSCULAR
  Filled 2022-09-03: qty 0.5

## 2022-09-03 MED ORDER — SODIUM CHLORIDE 0.9 % IV SOLN: Freq: Once | INTRAVENOUS | Status: AC

## 2022-09-03 MED ORDER — ACETAMINOPHEN 325 MG PO TABS
650.0000 mg | ORAL_TABLET | Freq: Once | ORAL | Status: AC
  Administered 2022-09-03: 650 mg via ORAL
  Filled 2022-09-03: qty 2

## 2022-09-03 MED ORDER — FAMOTIDINE IN NACL 20-0.9 MG/50ML-% IV SOLN
20.0000 mg | Freq: Once | INTRAVENOUS | Status: AC
  Administered 2022-09-03: 20 mg via INTRAVENOUS
  Filled 2022-09-03: qty 50

## 2022-09-03 NOTE — Patient Instructions (Addendum)
Influenza (Flu) Vaccine (Inactivated or Recombinant): What You Need to Know 1. Why get vaccinated? Influenza vaccine can prevent influenza (flu). Flu is a contagious disease that spreads around the [place] every year, usually between October and May. Anyone can get the flu, but it is more dangerous for some people. Infants and young children, people 65 years and older, pregnant people, and people with certain health conditions or a weakened immune system are at greatest risk of flu complications. Pneumonia, bronchitis, sinus infections, and ear infections are examples of flu-related complications. If you have a medical condition, such as heart disease, cancer, or diabetes, flu can make it worse. Flu can cause fever and chills, sore throat, muscle aches, fatigue, cough, headache, and runny or stuffy nose. Some people may have vomiting and diarrhea, though this is more common in children than adults. In an average year, thousands of people in the [place] die from flu, and many more are hospitalized. Flu vaccine prevents millions of illnesses and flu-related visits to the doctor each year. 2. Influenza vaccines CDC recommends everyone 6 months and older get vaccinated every flu season. Children 6 months through 8 years of age may need 2 doses during a single flu season. Everyone else needs only 1 dose each flu season. It takes about 2 weeks for protection to develop after vaccination. There are many flu viruses, and they are always changing. Each year a new flu vaccine is made to protect against the influenza viruses believed to be likely to cause disease in the upcoming flu season. Even when the vaccine doesn't exactly match these viruses, it may still provide some protection. Influenza vaccine does not cause flu. Influenza vaccine may be given at the same time as other vaccines. 3. Talk with your health care provider Tell your vaccination provider if the person getting the vaccine: Has had  an allergic reaction after a previous dose of influenza vaccine, or has any severe, life-threatening allergies Has ever had Guillain-Barr Syndrome (also called "GBS") In some cases, your health care provider may decide to postpone influenza vaccination until a future visit. Influenza vaccine can be administered at any time during pregnancy. People who are or will be pregnant during influenza season should receive inactivated influenza vaccine. People with minor illnesses, such as a cold, may be vaccinated. People who are moderately or severely ill should usually wait until they recover before getting influenza vaccine. Your health care provider can give you more information. 4. Risks of a vaccine reaction Soreness, redness, and swelling where the shot is given, fever, muscle aches, and headache can happen after influenza vaccination. There may be a very small increased risk of Guillain-Barr Syndrome (GBS) after inactivated influenza vaccine (the flu shot). Young children who get the flu shot along with pneumococcal vaccine (PCV13) and/or DTaP vaccine at the same time might be slightly more likely to have a seizure caused by fever. Tell your health care provider if a child who is getting flu vaccine has ever had a seizure. People sometimes faint after medical procedures, including vaccination. Tell your provider if you feel dizzy or have vision changes or ringing in the ears. As with any medicine, there is a very remote chance of a vaccine causing a severe allergic reaction, other serious injury, or death. 5. What if there is a serious problem? An allergic reaction could occur after the vaccinated person leaves the clinic. If you see signs of a severe allergic reaction (hives, swelling of the face and throat, difficulty breathing,   a fast heartbeat, dizziness, or weakness), call 9-1-1 and get the person to the nearest hospital. For other signs that concern you, call your health care provider. Adverse  reactions should be reported to the Vaccine Adverse Event Reporting System (VAERS). Your health care provider will usually file this report, or you can do it yourself. Visit the VAERS website at www.vaers.SamedayNews.es or call 302-018-9103. VAERS is only for reporting reactions, and VAERS staff members do not give medical advice. 6. The National Vaccine Injury Compensation Program The [employer] Vaccine Injury Compensation Program (VICP) is a federal program that was created to compensate people who may have been injured by certain vaccines. Claims regarding alleged injury or death due to vaccination have a time limit for filing, which may be as short as two years. Visit the VICP website at GoldCloset.com.ee or call 701-692-8215 to learn about the program and about filing a claim. 7. How can I learn more? Ask your health care provider. Call your local or state health department. Visit the website of the Food and Drug Administration (FDA) for vaccine package inserts and additional information at TraderRating.uy. Contact the Centers for Disease Control and Prevention (CDC): Call 670-149-9536 (1-800-CDC-INFO) or Visit CDC's website at https://gibson.com/. Source: CDC Vaccine Information Statement Inactivated Influenza Vaccine (06/23/2020) This same material is available at http://www.wolf.info/ for no charge. This information is not intended to replace advice given to you by your health care provider. Make sure you discuss any questions you have with your health care provider. Document Revised: 10/03/2021 Document Reviewed: 07/26/2021 Elsevier Patient Education  [place]. Ferumoxytol Injection What is this medication? FERUMOXYTOL (FER ue MOX i tol) treats low levels of iron in your body (iron deficiency anemia). Iron is a mineral that plays an important role in making red blood cells, which carry oxygen from your lungs to the rest of your body. This medicine may be  used for other purposes; ask your health care provider or pharmacist if you have questions. COMMON BRAND NAME(S): Feraheme What should I tell my care team before I take this medication? They need to know if you have any of these conditions: Anemia not caused by low iron levels High levels of iron in the blood Magnetic resonance imaging (MRI) test scheduled An unusual or allergic reaction to iron, other medications, foods, dyes, or preservatives Pregnant or trying to get pregnant Breast-feeding How should I use this medication? This medication is for injection into a vein. It is given in a hospital or clinic setting. Talk to your care team the use of this medication in children. Special care may be needed. Overdosage: If you think you have taken too much of this medicine contact a poison control center or emergency room at once. NOTE: This medicine is only for you. Do not share this medicine with others. What if I miss a dose? It is important not to miss your dose. Call your care team if you are unable to keep an appointment. What may interact with this medication? Other iron products This list may not describe all possible interactions. Give your health care provider a list of all the medicines, herbs, non-prescription drugs, or dietary supplements you use. Also tell them if you smoke, drink alcohol, or use illegal drugs. Some items may interact with your medicine. What should I watch for while using this medication? Visit your care team regularly. Tell your care team if your symptoms do not start to get better or if they get worse. You may need blood work  done while you are taking this medication. You may need to follow a special diet. Talk to your care team. Foods that contain iron include: whole grains/cereals, dried fruits, beans, or peas, leafy green vegetables, and organ meats (liver, kidney). What side effects may I notice from receiving this medication? Side effects that you should  report to your care team as soon as possible: Allergic reactions--skin rash, itching, hives, swelling of the face, lips, tongue, or throat Low blood pressure--dizziness, feeling faint or lightheaded, blurry vision Shortness of breath Side effects that usually do not require medical attention (report to your care team if they continue or are bothersome): Flushing Headache Joint pain Muscle pain Nausea Pain, redness, or irritation at injection site This list may not describe all possible side effects. Call your doctor for medical advice about side effects. You may report side effects to FDA at 1-800-FDA-1088. Where should I keep my medication? This medication is given in a hospital or clinic and will not be stored at home. NOTE: This sheet is a summary. It may not cover all possible information. If you have questions about this medicine, talk to your doctor, pharmacist, or health care provider.  2023 Elsevier/Gold Standard (2021-03-30 00:00:00)

## 2022-09-05 ENCOUNTER — Encounter: Payer: Self-pay | Admitting: Physician Assistant

## 2022-09-05 ENCOUNTER — Ambulatory Visit (INDEPENDENT_AMBULATORY_CARE_PROVIDER_SITE_OTHER): Payer: Medicare Other | Admitting: Physician Assistant

## 2022-09-05 DIAGNOSIS — Z630 Problems in relationship with spouse or partner: Secondary | ICD-10-CM

## 2022-09-05 DIAGNOSIS — F4323 Adjustment disorder with mixed anxiety and depressed mood: Secondary | ICD-10-CM | POA: Diagnosis not present

## 2022-09-05 DIAGNOSIS — G4700 Insomnia, unspecified: Secondary | ICD-10-CM | POA: Diagnosis not present

## 2022-09-05 DIAGNOSIS — Z79899 Other long term (current) drug therapy: Secondary | ICD-10-CM

## 2022-09-05 DIAGNOSIS — F4310 Post-traumatic stress disorder, unspecified: Secondary | ICD-10-CM | POA: Diagnosis not present

## 2022-09-05 DIAGNOSIS — F319 Bipolar disorder, unspecified: Secondary | ICD-10-CM

## 2022-09-05 NOTE — Progress Notes (Signed)
[facility]  Patient ID: Morgan [name],  MRN: [id number]  PCP: Maggie Schwalbe, PA-C  Date of Evaluation: 09/05/2022  Time spent:30 minutes  Chief Complaint:  Chief Complaint   Anxiety; Depression; Insomnia; Follow-up    Virtual Visit via Telehealth  I connected with patient by telephone, with their informed consent, and verified patient privacy and that I am speaking with the correct person using two identifiers.  I am private, in my office and the patient is at home.  I discussed the limitations, risks, security and privacy concerns of performing an evaluation and management service by telephone and the availability of in person appointments. I also discussed with the patient that there may be a patient responsible charge related to this service. The patient expressed understanding and agreed to proceed.   I discussed the assessment and treatment plan with the patient. The patient was provided an opportunity to ask questions and all were answered. The patient agreed with the plan and demonstrated an understanding of the instructions.   The patient was advised to call back or seek an in-person evaluation if the symptoms worsen or if the condition fails to improve as anticipated.  I provided 30 minutes of non-face-to-face time during this encounter.  HISTORY/CURRENT STATUS: HPI For routine med check.  Still having a lot of GI problems, see ROS. A lot of reflux, n/v. Doesn't think she's lost any more weight. Eats crackers and mashed potatoes b/c she can't keep other food down.   States her mood is stable.  She has not been sleeping well because of the reflux but she has started sleeping at a 45 degree angle which has helped some.  The anxiety is still a problem, but Valium does help.  Not having panic attacks but just feels overwhelmed a lot of the time.  Having the chronic illness makes things worse, just the unknown of what will happen next.  She and her  husband are having problems and she asks for recommendations for marriage counselor.  She does not feel like doing much physically.  Energy and motivation are fair, some days are better than others.  No extreme sadness, tearfulness, or feelings of hopelessness.  ADLs and personal hygiene are normal.   Denies any changes in concentration, making decisions, or remembering things.  Not isolating on purpose.  Denies suicidal or homicidal thoughts.  Patient denies increased energy with decreased need for sleep, increased talkativeness, racing thoughts, impulsivity or risky behaviors, increased spending, increased libido, grandiosity, increased irritability or anger, paranoia, or hallucinations.  Denies dizziness, syncope, seizures, numbness, tingling, tremor, tics, unsteady gait, slurred speech, confusion.  Has chronic headaches, neck and back pain. Denies frequent infections, or sores that heal slowly.  No polyphagia, polydipsia, or polyuria. Denies visual changes or paresthesias.   Individual Medical History/ Review of Systems: Changes? :Yes   iron infusion twice since LOV. Also saw cardiologist, has echo and stress test soon. Will be having more GI testing in the next few months.  Will also see a surgeon for excess skin removal after gastric bypass surgery was done.  Past medications for mental health diagnoses include: Zoloft, trazodone, gabapentin, Risperdal, hydroxyzine, prazosin, nortriptyline, Adderall, Rexulti, Topamax, Latuda, Ingrezza lithium, Valium, clozapine, BuSpar, Zyprexa  Allergies: Clindamycin/lincomycin, Nsaids, Oxycodone, and Percocet [oxycodone-acetaminophen]  Current Medications:  Current Outpatient Medications:    AIMOVIG 70 MG/ML SOAJ, Inject 70 mg into the muscle every 30 (thirty) days., Disp: , Rfl: 11   alum & mag hydroxide-simeth ([place]) [phone]  MG/5ML suspension, Take 15 mLs by mouth every 6 (six) hours as needed for indigestion., Disp: 355 mL, Rfl: 0   AMITIZA 24  MCG capsule, Take 24 mcg by mouth 2 (two) times daily with a meal. , Disp: , Rfl:    BOTOX 200 units injection, Inject 200 Units into the muscle every 3 (three) months., Disp: , Rfl:    buPROPion (WELLBUTRIN XL) 300 MG 24 hr tablet, Take 1 tablet (300 mg total) by mouth daily., Disp: 90 tablet, Rfl: 1   busPIRone (BUSPAR) 30 MG tablet, Take 1 tablet (30 mg total) by mouth 2 (two) times daily., Disp: 180 tablet, Rfl: 1   Cyanocobalamin (B-12 IJ), Inject 1 Dose as directed once a week., Disp: , Rfl:    cyclobenzaprine (FLEXERIL) 10 MG tablet, Take 1 tablet (10 mg total) by mouth 3 (three) times daily as needed for muscle spasms. (Patient taking differently: Take 10 mg by mouth daily as needed for muscle spasms.), Disp: 270 tablet, Rfl: 0   diazepam (VALIUM) 5 MG tablet, Take 1 tablet (5 mg total) by mouth every 12 (twelve) hours as needed for anxiety., Disp: 60 tablet, Rfl: 2   fluticasone (FLONASE) 50 MCG/ACT nasal spray, Place 1 spray into both nostrils daily as needed for allergies or rhinitis., Disp: , Rfl:    fluvoxaMINE (LUVOX) 100 MG tablet, Take 2 tablets (200 mg total) by mouth at bedtime., Disp: 180 tablet, Rfl: 1   gabapentin (NEURONTIN) 800 MG tablet, TAKE 1 TABLET BY MOUTH IN THE  MORNING AND AT LUNCH, AND TAKE 2 TABLETS BY MOUTH EVERY NIGHT MAY CRUSH AND MIX WITH APPLESAUCE /  PUDDING IF UNABLE TO SWALLOW TAB, Disp: 360 tablet, Rfl: 0   HYDROcodone-acetaminophen (NORCO/VICODIN) 5-325 MG tablet, Take 1 tablet by mouth every 6 (six) hours as needed., Disp: 10 tablet, Rfl: 0   hydrOXYzine (ATARAX/VISTARIL) 25 MG tablet, Take 1 tablet (25 mg total) by mouth 2 (two) times daily., Disp: 180 tablet, Rfl: 1   ipratropium-albuterol (DUONEB) 0.5-2.5 (3) MG/3ML SOLN, Inhale 3 mLs into the lungs every 6 (six) hours as needed (shortness of breath or wheezing)., Disp: , Rfl:    levothyroxine (SYNTHROID) 88 MCG tablet, Take 88 mcg by mouth daily before breakfast., Disp: , Rfl:    linaclotide (LINZESS) 290  MCG CAPS capsule, Take 290 mcg by mouth daily before breakfast., Disp: , Rfl:    montelukast (SINGULAIR) 10 MG tablet, Take 10 mg by mouth at bedtime. , Disp: , Rfl:    OLANZapine (ZYPREXA) 20 MG tablet, TAKE 1 TABLET BY MOUTH AT  BEDTIME (Patient taking differently: Take 20 mg by mouth at bedtime.), Disp: 90 tablet, Rfl: 3   omeprazole (PRILOSEC) 40 MG capsule, Take 40 mg by mouth in the morning and at bedtime., Disp: , Rfl:    ondansetron (ZOFRAN-ODT) 4 MG disintegrating tablet, Take 4 mg by mouth every 8 (eight) hours as needed for nausea. , Disp: , Rfl:    oxybutynin (DITROPAN) 5 MG tablet, Take 5 mg by mouth 2 (two) times daily., Disp: , Rfl:    prazosin (MINIPRESS) 1 MG capsule, TAKE 1 CAPSULE(1 MG) BY MOUTH AT BEDTIME, Disp: 30 capsule, Rfl: 1   tamsulosin (FLOMAX) 0.4 MG CAPS capsule, Take 1 capsule (0.4 mg total) by mouth daily., Disp: 30 capsule, Rfl: 0   topiramate (TOPAMAX) 100 MG tablet, TAKE ONE AND ONE-HALF TABLETS BY MOUTH TWICE A DAY, Disp: 270 tablet, Rfl: 0   traMADol (ULTRAM) 50 MG tablet, Take 100 mg  by mouth every 6 (six) hours as needed for moderate pain., Disp: , Rfl:    traZODone (DESYREL) 100 MG tablet, Take 1-1.5 tablets (100-150 mg total) by mouth at bedtime as needed for sleep., Disp: 135 tablet, Rfl: 1   ENULOSE 10 GM/15ML SOLN, Take 20 g by mouth in the morning and at bedtime., Disp: , Rfl:  Medication Side Effects: none  Family Medical/ Social History: Changes?  No  MENTAL HEALTH EXAM:  There were no vitals taken for this visit.There is no height or weight on file to calculate BMI.    General Appearance:  Unable to assess  Eye Contact:   Unable to assess  Speech:  Clear and Coherent and Normal Rate  Volume:  Normal  Mood:  Euthymic  Affect:   Unable to assess  Thought Process:  Goal Directed and Descriptions of Associations: Intact  Orientation:  Full (Time, Place, and Person)  Thought Content: Logical   Suicidal Thoughts:  No  Homicidal Thoughts:  No   Memory:  WNL  Judgement:  Good  Insight:  Good  Psychomotor Activity:   Unable to assess  Concentration:  Concentration: Good and Attention Span: Good  Recall:  Good  Fund of Knowledge: Good  Language: Good  Assets:  Financial Resources/Insurance Housing Transportation  ADL's:  Intact  Cognition: WNL  Prognosis:  Good   Labs 07/30/2022 CBC and CMP reviewed on chart.  DIAGNOSES:    ICD-10-CM   1. Bipolar I disorder ([place])  F31.9     2. PTSD (post-traumatic stress disorder)  F43.10     3. Situational mixed anxiety and depressive disorder  F43.23     4. Insomnia, unspecified type  G47.00     5. Stress due to marital problems  Z63.0     6. Polypharmacy  Z79.899       Receiving Psychotherapy: Yes   Jesse Sans  RECOMMENDATIONS:  PDMP was reviewed. Valium filled 08/23/2022.  Hydrocodone known to me. I provided 30 minutes of non-face-to-face time during this encounter, including time spent before and after the visit in records review, medical decision making, counseling pertinent to today's visit, and charting.   She does not want to change any of her mental health medications until the other workup is done, and hopefully once she feels better physically, her mental wellbeing will improve.  Continue Wellbutrin XL 300 mg, 1 p.o. every morning. Continue BuSpar 30 mg, 1 p.o. twice daily. Continue Valium 5 mg, 1 p.o. twice daily as needed.   Continue Luvox 100 mg, 2 p.o. nightly. Continue gabapentin 400 mg, 2 p.o. every morning, 2 p.o. at lunch, and 4 p.o. nightly. Continue hydroxyzine 25 mg, 1 p.o. twice daily as needed. Continue Zyprexa 20 mg, 1 p.o. nightly. Continue prazosin 1 mg p.o. nightly. Continue Seroquel 50 mg, 1 p.o. as directed per neuro. Continue Topamax per neuro. Continue trazodone 100 mg, 1.5 pills nightly as needed. Continue counseling. Referred to Emily Filbert for marriage counseling Return in 2 months.  Donnal Moat, PA-C

## 2022-09-18 ENCOUNTER — Telehealth: Payer: Medicare Other | Admitting: Physician Assistant

## 2022-09-22 ENCOUNTER — Other Ambulatory Visit: Payer: Self-pay | Admitting: Physician Assistant

## 2022-09-27 ENCOUNTER — Other Ambulatory Visit: Payer: Self-pay

## 2022-09-27 DIAGNOSIS — D509 Iron deficiency anemia, unspecified: Secondary | ICD-10-CM

## 2022-09-29 NOTE — Progress Notes (Deleted)
[facility]  Date:  07/31/2022  Established Patient Office Visit  Subjective   Patient ID: Morgan [name], female    DOB: [birth year]  Age: 51 y.o. MRN: [id number]  No chief complaint on file. Chief complaint: Anemia  Treatment: IV iron  HPI This is a 51 year old woman who I last saw in June 2021 for evaluation and treatment of anemia.  She was found to have iron deficiency but was unable to tolerate oral iron.  She had no evidence of bleeding but does have a history of gastric bypass and so is unable to absorb the iron properly.  We administered IV Feraheme in January and February of that year.  She had colonoscopy and EGD and September 2019.  We also tested her for hemolytic anemia and this was negative serum protein electrophoresis showed no monoclonal spike and ANA was negative her hemoglobin came up nicely from 9.4 with an MCV of 69 to 13.6 in June 2021 with an MCV of 87.  She was also found to have acutely severely abnormal liver function tests and evaluation was negative but she does have a family history of cirrhosis and so we did check anti-smooth muscle antibodies, which were negative as well.  She was referred to [facility] gastroenterology and they followed up on this problem.  She did have colonoscopy and EGD by Dr. Coralie Keens on September 11 and these were good.  It was felt she could have a repeat colonoscopy in 10 years.  Interval history The patient recently had labs done by her primary care provider, Ray Nodal, and he found her to be very iron deficient with a ferritin of 4 and an iron saturation of 10%.  Her B12 level was low at 165.  He has already started her on B12 injections but she comes in to discuss IV iron.  She did have a recent colonoscopy and upper endoscopy.  She went to the emergency room last night with burning of her throat and stomach.  CT scan was clear.  They could see evidence of her prior bypass.  The burning in her chest was so  severe that she thought it was a heart attack.  I suggested that we try Carafate and it turns out that this has already been prescribed for her and I encouraged her to take it.  She is on omeprazole as well as Prevacid AC and Pepcid.  She does complain of nausea and poor appetite.  She has lost about 8-1/2 pounds since I last saw her over 2 years ago.  She denies hemoptysis, cough, dyspnea or chest pain at present.  She denies vomiting, bowel problems, hematochezia, melena, or hematemesis.  She does not complain of abdominal pain.  She does complain of fatigue.  Labs today show that her hemoglobin has dropped from 13.1 in March to 12.2 in August and now 11.4 with an MCV of 85.5.  CMP reveals a nonfasting blood sugar 119, low calcium of 8.6, and low potassium of 3.3.  Review of Systems  Constitutional:  Positive for malaise/fatigue and weight loss.  HENT:  Positive for sore throat.   Eyes: Negative.   Respiratory: Negative.    Cardiovascular:  Positive for chest pain.  Gastrointestinal:  Positive for nausea.  Genitourinary: Negative.   Musculoskeletal: Negative.   Skin: Negative.   Neurological: Negative.   Endo/Heme/Allergies: Negative.   Psychiatric/Behavioral: Negative.        Objective:     There were no vitals taken  for this visit.   Physical Exam Vitals and nursing note reviewed.  Constitutional:      Appearance: Normal appearance.  HENT:     Head: Normocephalic and atraumatic.     Nose: Nose normal.     Mouth/Throat:     Pharynx: Oropharynx is clear.  Eyes:     Extraocular Movements: Extraocular movements intact.     Conjunctiva/sclera: Conjunctivae normal.     Pupils: Pupils are equal, round, and reactive to light.  Cardiovascular:     Rate and Rhythm: Normal rate and regular rhythm.     Heart sounds: Normal heart sounds.  Pulmonary:     Effort: Pulmonary effort is normal.     Breath sounds: Normal breath sounds.  Abdominal:     Palpations: Abdomen is soft.   Musculoskeletal:        General: Normal range of motion.     Cervical back: Normal range of motion.  Skin:    General: Skin is warm and dry.  Neurological:     General: No focal deficit present.     Mental Status: She is alert and oriented to person, place, and time.  Psychiatric:        Mood and Affect: Mood normal.        Behavior: Behavior normal.        Thought Content: Thought content normal.        Judgment: Judgment normal.     No results found for any visits on 09/30/22.  Last CBC Lab Results  Component Value Date   WBC 4.4 07/30/2022   HGB 11.4 (L) 07/30/2022   HCT 36.1 07/30/2022   MCV 85.5 07/30/2022   MCH 27.0 07/30/2022   RDW 13.7 07/30/2022   PLT 195 46/96/2952   Last metabolic panel Lab Results  Component Value Date   GLUCOSE 119 (H) 07/30/2022   NA 140 07/30/2022   K 3.3 (L) 07/30/2022   CL 112 (H) 07/30/2022   CO2 23 07/30/2022   BUN 18 07/30/2022   CREATININE 1.01 (H) 07/30/2022   GFRNONAA >60 07/30/2022   CALCIUM 8.6 (L) 07/30/2022   PROT 6.5 07/30/2022   ALBUMIN 3.9 07/30/2022   BILITOT 0.6 07/30/2022   ALKPHOS 103 07/30/2022   AST 24 07/30/2022   ALT 19 07/30/2022   ANIONGAP 5 07/30/2022         Assessment & Plan:   Iron deficiency anemia This is recurrent 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  She does not tolerate oral iron and so we will arrange for her to get IV iron again.   B12 deficiency She is already on B12 injections.  She may have a lack of intrinsic factor with her gastric bypass and so we will probably need to stay on that lifelong.  Hypokalemia I do think that she would have difficulty tolerating oral potassium and so I have recommended she increase potassium in her diet and gave her a list of foods that are high in potassium.  History of gastric bypass.   We will arrange for IV iron and I have recommended that she increase potassium in her diet.  Her calcium is also mildly low and so I have recommended  increasing calcium in her diet and taking Tums.  I can see her back in 2 months with CBC and CMP to see if these abnormalities are corrected.  Most likely we can then have Raynaud's I will continue to follow her and let us know when she might need  IV iron.  I suspect she will need it from time to time.  She had it in 2019 and again in 2021 and now requires it again.  I have answered her questions and she understands and agrees with this plan of care.  No follow-ups on file.    Derwood Kaplan, MD

## 2022-09-30 ENCOUNTER — Ambulatory Visit: Payer: Medicare Other | Admitting: Oncology

## 2022-09-30 ENCOUNTER — Other Ambulatory Visit: Payer: Medicare Other

## 2022-09-30 DIAGNOSIS — D508 Other iron deficiency anemias: Secondary | ICD-10-CM

## 2022-09-30 DIAGNOSIS — D518 Other vitamin B12 deficiency anemias: Secondary | ICD-10-CM

## 2022-10-17 ENCOUNTER — Other Ambulatory Visit: Payer: Self-pay | Admitting: Physician Assistant

## 2022-10-22 ENCOUNTER — Other Ambulatory Visit: Payer: Self-pay | Admitting: Physician Assistant

## 2022-10-28 ENCOUNTER — Telehealth: Payer: Medicare Other | Admitting: Physician Assistant

## 2022-10-30 ENCOUNTER — Other Ambulatory Visit: Payer: Self-pay | Admitting: Physician Assistant

## 2022-11-01 ENCOUNTER — Other Ambulatory Visit: Payer: Self-pay | Admitting: Physician Assistant

## 2022-11-05 ENCOUNTER — Other Ambulatory Visit: Payer: Self-pay | Admitting: Physician Assistant

## 2022-11-25 ENCOUNTER — Other Ambulatory Visit: Payer: Self-pay | Admitting: Physician Assistant

## 2022-11-26 ENCOUNTER — Other Ambulatory Visit: Payer: Self-pay | Admitting: Physician Assistant

## 2022-11-26 NOTE — Telephone Encounter (Signed)
Filled 12/5

## 2022-11-29 ENCOUNTER — Ambulatory Visit (INDEPENDENT_AMBULATORY_CARE_PROVIDER_SITE_OTHER): Payer: Medicare Other | Admitting: Physician Assistant

## 2022-11-29 ENCOUNTER — Encounter: Payer: Self-pay | Admitting: Physician Assistant

## 2022-11-29 DIAGNOSIS — F4310 Post-traumatic stress disorder, unspecified: Secondary | ICD-10-CM | POA: Diagnosis not present

## 2022-11-29 DIAGNOSIS — G4700 Insomnia, unspecified: Secondary | ICD-10-CM | POA: Diagnosis not present

## 2022-11-29 DIAGNOSIS — R079 Chest pain, unspecified: Secondary | ICD-10-CM

## 2022-11-29 DIAGNOSIS — F4323 Adjustment disorder with mixed anxiety and depressed mood: Secondary | ICD-10-CM

## 2022-11-29 DIAGNOSIS — F319 Bipolar disorder, unspecified: Secondary | ICD-10-CM

## 2022-11-29 MED ORDER — DIAZEPAM 5 MG PO TABS: 5.0000 mg | ORAL_TABLET | Freq: Two times a day (BID) | ORAL | 2 refills | Status: DC | PRN

## 2022-11-29 MED ORDER — PRAZOSIN HCL 1 MG PO CAPS: ORAL_CAPSULE | ORAL | 1 refills | Status: DC

## 2022-11-29 MED ORDER — BUSPIRONE HCL 30 MG PO TABS: ORAL_TABLET | ORAL | 1 refills | Status: DC

## 2022-11-29 MED ORDER — FLUVOXAMINE MALEATE 100 MG PO TABS: ORAL_TABLET | ORAL | 1 refills | Status: DC

## 2022-11-29 NOTE — Progress Notes (Unsigned)
[facility]  Patient ID: Morgan [name],  MRN: [id number]  PCP: Maggie Schwalbe, PA-C  Date of Evaluation: 11/29/2022  Time spent:30 minutes  Chief Complaint:  Chief Complaint   Anxiety; Depression; Insomnia; Follow-up    Virtual Visit via Telehealth  I connected with patient by telephone, with their informed consent, and verified patient privacy and that I am speaking with the correct person using two identifiers.  I am private, in my office and the patient is at home.  I discussed the limitations, risks, security and privacy concerns of performing an evaluation and management service by telephone and the availability of in person appointments. I also discussed with the patient that there may be a patient responsible charge related to this service. The patient expressed understanding and agreed to proceed.   I discussed the assessment and treatment plan with the patient. The patient was provided an opportunity to ask questions and all were answered. The patient agreed with the plan and demonstrated an understanding of the instructions.   The patient was advised to call back or seek an in-person evaluation if the symptoms worsen or if the condition fails to improve as anticipated.  I provided 30 minutes of non-face-to-face time during this encounter.  HISTORY/CURRENT STATUS: HPI For routine med check.  Feels like meds are working well. Patient is able to enjoy things.  Energy and motivation are good.  Doesn't work.   No extreme sadness, tearfulness, or feelings of hopelessness.  Sleeps well most of the time. Not having nightmares. Has vivid, bizarre dreams sometimes though. ADLs and personal hygiene are normal.   Denies any changes in concentration, making decisions, or remembering things.  Appetite has not changed.  Weight is stable. Still has anxiety, more generalized that PA. Has a hard time getting her mind to turn off. Valium helps these issues.  Denies  suicidal or homicidal thoughts.  Patient denies increased energy with decreased need for sleep, increased talkativeness, racing thoughts, impulsivity or risky behaviors, increased spending, increased libido, grandiosity, increased irritability or anger, paranoia, or hallucinations.  Denies dizziness, syncope, seizures, numbness, tingling, tremor, tics, unsteady gait, slurred speech, confusion.  Has chronic headaches, neck and back pain. Denies frequent infections, or sores that heal slowly.  No polyphagia, polydipsia, or polyuria. Denies visual changes or paresthesias.   Individual Medical History/ Review of Systems: Changes? :Yes    RSV, COVID, Flu since LOV.   Past medications for mental health diagnoses include: Zoloft, trazodone, gabapentin, Risperdal, hydroxyzine, prazosin, nortriptyline, Adderall, Rexulti, Topamax, Latuda, Ingrezza lithium, Valium, clozapine, BuSpar, Zyprexa  Allergies: Clindamycin/lincomycin, Nsaids, Oxycodone, and Percocet [oxycodone-acetaminophen]  Current Medications:  Current Outpatient Medications:    alum & mag hydroxide-simeth (MAALOX MAX) 400-400-40 MG/5ML suspension, Take 15 mLs by mouth every 6 (six) hours as needed for indigestion., Disp: 355 mL, Rfl: 0   AMITIZA 24 MCG capsule, Take 24 mcg by mouth 2 (two) times daily with a meal. , Disp: , Rfl:    BOTOX 200 units injection, Inject 200 Units into the muscle every 3 (three) months., Disp: , Rfl:    buPROPion (WELLBUTRIN XL) 300 MG 24 hr tablet, Take 1 tablet (300 mg total) by mouth daily., Disp: 90 tablet, Rfl: 1   Cyanocobalamin (B-12 IJ), Inject 1 Dose as directed once a week., Disp: , Rfl:    cyclobenzaprine (FLEXERIL) 10 MG tablet, Take 1 tablet (10 mg total) by mouth 3 (three) times daily as needed for muscle spasms. (Patient taking differently: Take 10 mg by  mouth daily as needed for muscle spasms.), Disp: 270 tablet, Rfl: 0   ENULOSE 10 GM/15ML SOLN, Take 20 g by mouth in the morning and at bedtime.,  Disp: , Rfl:    fluticasone (FLONASE) 50 MCG/ACT nasal spray, Place 1 spray into both nostrils daily as needed for allergies or rhinitis., Disp: , Rfl:    gabapentin (NEURONTIN) 800 MG tablet, TAKE 1 TABLET BY MOUTH IN THE  MORNING AND AT LUNCH, AND TAKE 2 TABLETS EVERY NIGHT MAY CRUSH  AND MIX WITH APPLESAUCE /  PUDDING IF UNABLE TO SWALLOW TAB, Disp: 360 tablet, Rfl: 0   HYDROcodone-acetaminophen (NORCO/VICODIN) 5-325 MG tablet, Take 1 tablet by mouth every 6 (six) hours as needed., Disp: 10 tablet, Rfl: 0   hydrOXYzine (ATARAX/VISTARIL) 25 MG tablet, Take 1 tablet (25 mg total) by mouth 2 (two) times daily., Disp: 180 tablet, Rfl: 1   ipratropium-albuterol (DUONEB) 0.5-2.5 (3) MG/3ML SOLN, Inhale 3 mLs into the lungs every 6 (six) hours as needed (shortness of breath or wheezing)., Disp: , Rfl:    levothyroxine (SYNTHROID) 88 MCG tablet, Take 88 mcg by mouth daily before breakfast., Disp: , Rfl:    linaclotide (LINZESS) 290 MCG CAPS capsule, Take 290 mcg by mouth daily before breakfast., Disp: , Rfl:    montelukast (SINGULAIR) 10 MG tablet, Take 10 mg by mouth at bedtime. , Disp: , Rfl:    OLANZapine (ZYPREXA) 20 MG tablet, TAKE 1 TABLET BY MOUTH AT  BEDTIME (Patient taking differently: Take 20 mg by mouth at bedtime.), Disp: 90 tablet, Rfl: 3   omeprazole (PRILOSEC) 40 MG capsule, Take 40 mg by mouth in the morning and at bedtime., Disp: , Rfl:    ondansetron (ZOFRAN-ODT) 4 MG disintegrating tablet, Take 4 mg by mouth every 8 (eight) hours as needed for nausea. , Disp: , Rfl:    oxybutynin (DITROPAN) 5 MG tablet, Take 5 mg by mouth 2 (two) times daily., Disp: , Rfl:    tamsulosin (FLOMAX) 0.4 MG CAPS capsule, Take 1 capsule (0.4 mg total) by mouth daily., Disp: 30 capsule, Rfl: 0   topiramate (TOPAMAX) 100 MG tablet, Take one and one-half tablets by mouth twice a day, Disp: 270 tablet, Rfl: 0   traMADol (ULTRAM) 50 MG tablet, Take 100 mg by mouth every 6 (six) hours as needed for moderate pain.,  Disp: , Rfl:    traZODone (DESYREL) 100 MG tablet, Take 1-1.5 tablets (100-150 mg total) by mouth at bedtime as needed for sleep., Disp: 135 tablet, Rfl: 1   AIMOVIG 70 MG/ML SOAJ, Inject 70 mg into the muscle every 30 (thirty) days. (Patient not taking: Reported on 11/29/2022), Disp: , Rfl: 11   busPIRone (BUSPAR) 30 MG tablet, TAKE 1 TABLET(30 MG) BY MOUTH TWICE DAILY, Disp: 180 tablet, Rfl: 1   diazepam (VALIUM) 5 MG tablet, Take 1 tablet (5 mg total) by mouth every 12 (twelve) hours as needed for anxiety., Disp: 60 tablet, Rfl: 2   fluvoxaMINE (LUVOX) 100 MG tablet, TAKE 2 TABLETS(200 MG) BY MOUTH AT BEDTIME, Disp: 180 tablet, Rfl: 1   prazosin (MINIPRESS) 1 MG capsule, TAKE 1 CAPSULE(1 MG) BY MOUTH AT BEDTIME, Disp: 90 capsule, Rfl: 1 Medication Side Effects: none  Family Medical/ Social History: Changes?  No  MENTAL HEALTH EXAM:  There were no vitals taken for this visit.There is no height or weight on file to calculate BMI.    General Appearance:  Unable to assess  Eye Contact:   Unable to assess  Speech:  Clear and Coherent and Normal Rate  Volume:  Normal  Mood:  Euthymic  Affect:   Unable to assess  Thought Process:  Goal Directed and Descriptions of Associations: Intact  Orientation:  Full (Time, Place, and Person)  Thought Content: Logical   Suicidal Thoughts:  No  Homicidal Thoughts:  No  Memory:  WNL  Judgement:  Good  Insight:  Good  Psychomotor Activity:   Unable to assess  Concentration:  Concentration: Good and Attention Span: Good  Recall:  Good  Fund of Knowledge: Good  Language: Good  Assets:  Communication Skills Desire for Improvement Financial Resources/Insurance Housing  ADL's:  Intact  Cognition: WNL  Prognosis:  Good   Labs 10/07/2022 CMP Glucose 77, all other values nl.   No recent LP  DIAGNOSES:    ICD-10-CM   1. Bipolar I disorder ([place])  F31.9     2. PTSD (post-traumatic stress disorder)  F43.10     3. Situational mixed anxiety and  depressive disorder  F43.23     4. Insomnia, unspecified type  G47.00     5. Chest pain, unspecified type  R07.9       Receiving Psychotherapy: Yes   Jesse Sans  RECOMMENDATIONS:  PDMP was reviewed. Valium filled 08/23/2022.  Hydrocodone known to me. I provided 30 minutes of non-face-to-face time during this encounter, including time spent before and after the visit in records review, medical decision making, counseling pertinent to today's visit, and charting.   She is stable w/ mental health medications so no change in treatment needed,   Continue Wellbutrin XL 300 mg, 1 p.o. every morning. Continue BuSpar 30 mg, 1 p.o. twice daily. Continue Valium 5 mg, 1 p.o. twice daily as needed.   Continue Luvox 100 mg, 2 p.o. nightly. Continue gabapentin 400 mg, 2 p.o. every morning, 2 p.o. at lunch, and 4 p.o. nightly. Continue hydroxyzine 25 mg, 1 p.o. twice daily as needed. Continue Zyprexa 20 mg, 1 p.o. nightly. Continue prazosin 1 mg p.o. nightly. Continue Seroquel 50 mg, 1 p.o. as directed per neuro. Continue Topamax per neuro. Continue trazodone 100 mg, 1.5 pills nightly as needed. Continue counseling. Get LP drawn when sees her PCP soon.  Return in 3 months.  Donnal Moat, PA-C

## 2022-12-02 ENCOUNTER — Other Ambulatory Visit: Payer: Self-pay | Admitting: Physician Assistant

## 2022-12-11 ENCOUNTER — Other Ambulatory Visit: Payer: Self-pay | Admitting: Oncology

## 2022-12-11 ENCOUNTER — Telehealth: Payer: Self-pay

## 2022-12-11 ENCOUNTER — Encounter: Payer: Self-pay | Admitting: Oncology

## 2022-12-11 DIAGNOSIS — D508 Other iron deficiency anemias: Secondary | ICD-10-CM

## 2022-12-11 NOTE — Telephone Encounter (Signed)
Pt reports feeling like her iron is low again. She wonders if she could come in for lab draw? She had recent CBC, CMP @ [facility] (hgb 15). She is scheduled for a panniculectomy on 12/27/22. She would really like to have iron infusion if needed, before surgery. Labs done @ [facility] are below.Marland KitchenMarland KitchenPlease advise.    ABNORMAL) CBC and Differential (12/10/2022 9:25 AM EST) Lab Results - (ABNORMAL) CBC and Differential (12/10/2022 9:25 AM EST) Component Value Ref Range Test Method Analysis Time Performed At Pathologist Signature  WBC 5.1 4.4 - 11.0 x 10*3/uL   12/10/2022 10:22 AM EST [place] [facility]    RBC 5.11 (H) 4.10 - 5.10 x 10*6/uL   12/10/2022 10:22 AM EST [place] [facility]    Hemoglobin 15.0 12.3 - 15.3 G/DL   12/10/2022 10:22 AM EST [place] [facility]    Hematocrit 44.3 35.9 - 44.6 %   12/10/2022 10:22 AM EST [place] [facility]    MCV 86.8 80.0 - 96.0 FL   12/10/2022 10:22 AM EST [place] [facility]    MCH 29.4 27.5 - 33.2 PG   12/10/2022 10:22 AM EST [place] [facility]    MCHC 33.9 33.0 - 37.0 G/DL   12/10/2022 10:22 AM EST [place] [facility]    RDW 14.9 12.3 - 17.0 %   12/10/2022 10:22 AM EST [place] [facility]    Platelets 172 150 - 450 X 10*3/uL   12/10/2022 10:22 AM EST [place] [facility]    MPV 9.1 6.8 - 10.2 FL   12/10/2022 10:22 AM EST [place] [facility]    Neutrophil % 34 %   12/10/2022 10:22 AM EST [place] [facility]    Lymphocyte % 44 %   12/10/2022 10:22 AM EST [place] [facility]    Monocyte % 8 %   12/10/2022 10:22 AM EST [place] [facility]    Eosinophil % 13 %   12/10/2022 10:22 AM EST [place] [facility]    Basophil % 1 %   12/10/2022 10:22 AM EST [place] [facility]    Neutrophil Absolute 1.7 (L) 1.8 - 7.8 x 10*3/uL    12/10/2022 10:22 AM EST [place] [facility]    Lymphocyte Absolute 2.2 1.0 - 4.8 x 10*3/uL   12/10/2022 10:22 AM EST [place] [facility]    Monocyte Absolute 0.4 0.0 - 0.8 x 10*3/uL   12/10/2022 10:22 AM EST [place] [facility]    Eosinophil Absolute 0.7 (H) 0.0 - 0.5 x 10*3/uL   12/10/2022 10:22 AM EST [place] [facility]    Basophil Absolute 0.1 0.0 - 0.2 x 10*3/uL   12/10/2022 10:22 AM EST [place] [facility]    nRBC 0 <=0 x 10*3/uL   12/10/2022 10:22 AM EST [place] [facility]     Lab Results - (ABNORMAL) CBC and Differential (12/10/2022 9:25 AM EST) Specimen (Source) Anatomical Location / Laterality Collection Method / Volume Collection Time Received Time  Blood   Venipuncture / Unknown 12/10/2022 9:25 AM EST 12/10/2022 9:25 AM EST   Lab Results - (ABNORMAL) CBC and Differential (12/10/2022 9:25 AM EST) Narrative      Lab Results - (  ABNORMAL) CBC and Differential (12/10/2022 9:25 AM EST) Authorizing Provider Result Type  Birder Robson FNP LAB BLOOD ORDERABLES   Lab Results - (ABNORMAL) CBC and Differential (12/10/2022 9:25 AM EST) Performing Organization Address City/State/ZIP Code Phone Number  [place]  CLIA# [id number]  [facility] [place], [place] [address]       Back to top of Lab Results    (ABNORMAL) Comprehensive Metabolic Panel (43/32/9518 9:25 AM EST) Lab Results - (ABNORMAL) Comprehensive Metabolic Panel (84/16/6063 9:25 AM EST) Component Value Ref Range Test Method Analysis Time Performed At Pathologist Signature  Sodium 138 135 - 146 MMOL/L   12/10/2022 10:46 AM EST [place] [facility]    Potassium 3.8 3.5 - 5.3 MMOL/L   12/10/2022 10:46 AM EST [place] [facility]    Comment: NO VISIBLE HEMOLYSIS  Chloride 107 98 - 110 MMOL/L   12/10/2022 10:46 AM EST [place] [facility]     CO2 24 21 - 31 MMOL/L   12/10/2022 10:46 AM EST [place] [facility]    BUN 19 8 - 24 MG/DL   12/10/2022 10:46 AM EST [place] [facility]    Glucose 61 (L) 70 - 99 MG/DL   12/10/2022 10:46 AM EST [place] [facility]    Creatinine 0.93 0.60 - 1.20 MG/DL   12/10/2022 10:46 AM EST [place] [facility]    Calcium 9.6 8.5 - 10.5 MG/DL   12/10/2022 10:46 AM EST [place] [facility]    Total Protein 6.2 (L) 6.4 - 8.9 G/DL   12/10/2022 10:46 AM EST [place] [facility]    Albumin 4.4 3.5 - 5.7 G/DL   12/10/2022 10:46 AM EST [place] [facility]    Total Bilirubin 0.9 0.0 - 1.0 MG/DL   12/10/2022 10:46 AM EST [place] [facility]    Alkaline Phosphatase 104 34 - 104 IU/L or U/L   12/10/2022 10:46 AM EST [place] [facility]    AST (SGOT) 15 13 - 39 IU/L or U/L   12/10/2022 10:46 AM EST [place] [facility]    ALT (SGPT) 12 7 - 52 IU/L or U/L   12/10/2022 10:46 AM EST [place] [facility]    Anion Gap 7 4 - 14 MMOL/L   12/10/2022 10:46 AM EST [place] [facility]    Est. GFR 74 >=60 ML/MIN/1.73 M*2   12/10/2022 10:46 AM EST [place] [facility]    Comment: GFR estimated by CKD-EPI equations(NKF 2021).  "Recommend confirmation of Cr-based eGFR by using Cys-based eGFR and other filtration markers (if applicable) in complex cases and clinical decision-making, as needed."   Lab Results - (ABNORMAL) Comprehensive Metabolic Panel (01/60/1093 9:25 AM EST) Specimen (Source) Anatomical Location / Laterality Collection Method / Volume Collection Time Received Time  Blood   Venipuncture / Unknown 12/10/2022 9:25 AM EST 12/10/2022 9:25 AM EST   Lab Results - (ABNORMAL) Comprehensive Metabolic Panel (23/55/7322 9:25 AM EST) Narrative      Lab Results - (ABNORMAL) Comprehensive Metabolic Panel (02/54/2706  9:25 AM EST) Authorizing Provider Result Type  Birder Robson FNP LAB BLOOD ORDERABLES   Lab Results - (ABNORMAL) Comprehensive Metabolic Panel (23/76/2831 9:25 AM EST) Performing Organization Address City/State/ZIP Code Phone Number   [facility]  [place]  CLIA# [id number]  [place], [place] [address]       Back to top of Lab Results    (ABNORMAL) CBC and Differential (12/10/2022 9:25 AM EST) Lab Results - (ABNORMAL) CBC and Differential (12/10/2022 9:25 AM EST) Specimen (Source) Anatomical Location / Laterality Collection Method / Volume Collection Time Received Time  Blood     12/10/2022 9:25 AM EST     Lab Results - (ABNORMAL) CBC and Differential (12/10/2022 9:25 AM EST) Narrative  Lab, Background User - 12/10/2022 10:22 AM EST  The following orders were created for panel order CBC and Differential. Procedure                               Abnormality         Status                   ---------                               -----------         ------                   CBC and Differential[[id number]]         Abnormal            Final result              Please view results for these tests on the individual orders.    Lab Results - (ABNORMAL) CBC and Differential (12/10/2022 9:25 AM EST) Authorizing Provider Result Type  Birder Robson FNP LAB BLOOD ORDERABLES

## 2022-12-12 ENCOUNTER — Inpatient Hospital Stay: Payer: 59 | Attending: Oncology

## 2022-12-12 DIAGNOSIS — D508 Other iron deficiency anemias: Secondary | ICD-10-CM

## 2022-12-12 DIAGNOSIS — D509 Iron deficiency anemia, unspecified: Secondary | ICD-10-CM | POA: Diagnosis present

## 2022-12-12 DIAGNOSIS — E876 Hypokalemia: Secondary | ICD-10-CM | POA: Insufficient documentation

## 2022-12-12 DIAGNOSIS — E538 Deficiency of other specified B group vitamins: Secondary | ICD-10-CM | POA: Insufficient documentation

## 2022-12-12 LAB — IRON AND TIBC
Iron: 88 ug/dL (ref 28–170)
Saturation Ratios: 27 % (ref 10.4–31.8)
TIBC: 322 ug/dL (ref 250–450)
UIBC: 234 ug/dL

## 2022-12-12 LAB — CMP (CANCER CENTER ONLY)
ALT: 17 U/L (ref 0–44)
AST: 21 U/L (ref 15–41)
Albumin: 4 g/dL (ref 3.5–5.0)
Alkaline Phosphatase: 86 U/L (ref 38–126)
Anion gap: 7 (ref 5–15)
BUN: 25 mg/dL — ABNORMAL HIGH (ref 6–20)
CO2: 24 mmol/L (ref 22–32)
Calcium: 9.3 mg/dL (ref 8.9–10.3)
Chloride: 110 mmol/L (ref 98–111)
Creatinine: 0.97 mg/dL (ref 0.44–1.00)
GFR, Estimated: >60 mL/min (ref >60)
Glucose, Bld: 67 mg/dL — ABNORMAL LOW (ref 70–99)
Potassium: 4 mmol/L (ref 3.5–5.1)
Sodium: 141 mmol/L (ref 135–145)
Total Bilirubin: 0.7 mg/dL (ref 0.3–1.2)
Total Protein: 6.3 g/dL — ABNORMAL LOW (ref 6.5–8.1)

## 2022-12-12 LAB — FERRITIN: Ferritin: 63 ng/mL (ref 11–307)

## 2022-12-12 LAB — CBC WITH DIFFERENTIAL (CANCER CENTER ONLY)
Abs Immature Granulocytes: 0.01 10*3/uL (ref 0.00–0.07)
Basophils Absolute: 0.1 10*3/uL (ref 0.0–0.1)
Basophils Relative: 1 %
Eosinophils Absolute: 0.6 10*3/uL — ABNORMAL HIGH (ref 0.0–0.5)
Eosinophils Relative: 13 %
HCT: 44.7 % (ref 36.0–46.0)
Hemoglobin: 14.7 g/dL (ref 12.0–15.0)
Immature Granulocytes: 0 %
Lymphocytes Relative: 40 %
Lymphs Abs: 1.8 10*3/uL (ref 0.7–4.0)
MCH: 29.5 pg (ref 26.0–34.0)
MCHC: 32.9 g/dL (ref 30.0–36.0)
MCV: 89.6 fL (ref 80.0–100.0)
Monocytes Absolute: 0.4 10*3/uL (ref 0.1–1.0)
Monocytes Relative: 8 %
Neutro Abs: 1.7 10*3/uL (ref 1.7–7.7)
Neutrophils Relative %: 38 %
Platelet Count: 190 10*3/uL (ref 150–400)
RBC: 4.99 MIL/uL (ref 3.87–5.11)
RDW: 13.7 % (ref 11.5–15.5)
WBC Count: 4.5 10*3/uL (ref 4.0–10.5)
nRBC: 0 % (ref 0.0–0.2)

## 2022-12-15 ENCOUNTER — Other Ambulatory Visit: Payer: Self-pay | Admitting: Physician Assistant

## 2022-12-24 ENCOUNTER — Telehealth: Payer: Self-pay

## 2022-12-24 NOTE — Telephone Encounter (Signed)
Attempted to contact patient. No answer and no VM.  

## 2022-12-24 NOTE — Telephone Encounter (Signed)
-----   Message from Derwood Kaplan, MD sent at 12/24/2022 12:39 PM EST ----- Regarding: call? Did we already call her? Labs look great, hgb up to 14.7, iron studies look good.  Just a little dry, rec drink more fluids

## 2022-12-25 ENCOUNTER — Telehealth: Payer: Self-pay

## 2022-12-25 NOTE — Telephone Encounter (Signed)
Attempted to contact patient. No answer. 

## 2022-12-25 NOTE — Telephone Encounter (Signed)
-----   Message from Derwood Kaplan, MD sent at 12/24/2022 12:39 PM EST ----- Regarding: call? Did we already call her? Labs look great, hgb up to 14.7, iron studies look good.  Just a little dry, rec drink more fluids

## 2023-01-09 ENCOUNTER — Other Ambulatory Visit: Payer: Self-pay | Admitting: Physician Assistant

## 2023-01-28 ENCOUNTER — Other Ambulatory Visit: Payer: Self-pay | Admitting: Physician Assistant

## 2023-01-29 ENCOUNTER — Encounter: Payer: Self-pay | Admitting: Oncology

## 2023-02-21 ENCOUNTER — Encounter: Payer: Self-pay | Admitting: Oncology

## 2023-02-28 ENCOUNTER — Ambulatory Visit (INDEPENDENT_AMBULATORY_CARE_PROVIDER_SITE_OTHER): Payer: 59 | Admitting: Physician Assistant

## 2023-02-28 ENCOUNTER — Encounter: Payer: Self-pay | Admitting: Oncology

## 2023-02-28 ENCOUNTER — Encounter: Payer: Self-pay | Admitting: Physician Assistant

## 2023-02-28 DIAGNOSIS — F411 Generalized anxiety disorder: Secondary | ICD-10-CM

## 2023-02-28 DIAGNOSIS — F4310 Post-traumatic stress disorder, unspecified: Secondary | ICD-10-CM | POA: Diagnosis not present

## 2023-02-28 DIAGNOSIS — G4700 Insomnia, unspecified: Secondary | ICD-10-CM | POA: Diagnosis not present

## 2023-02-28 DIAGNOSIS — Z79899 Other long term (current) drug therapy: Secondary | ICD-10-CM

## 2023-02-28 DIAGNOSIS — F319 Bipolar disorder, unspecified: Secondary | ICD-10-CM

## 2023-02-28 MED ORDER — TOPIRAMATE 100 MG PO TABS: 200.0000 mg | ORAL_TABLET | Freq: Two times a day (BID) | ORAL | 0 refills | Status: DC

## 2023-02-28 MED ORDER — OLANZAPINE 20 MG PO TABS: 20.0000 mg | ORAL_TABLET | Freq: Every day | ORAL | 3 refills | Status: DC

## 2023-02-28 MED ORDER — BUPROPION HCL ER (XL) 150 MG PO TB24: 450.0000 mg | ORAL_TABLET | Freq: Every day | ORAL | 1 refills | Status: DC

## 2023-02-28 NOTE — Progress Notes (Signed)
[facility]  Patient ID: Yuvonne Lanahan,  MRN: [id number]  PCP: Leane Call, PA-C  Date of Evaluation: 02/28/2023  Time spent:30 minutes  Chief Complaint:  Chief Complaint   Anxiety; Depression; Insomnia; Follow-up    Virtual Visit via Telehealth  I connected with patient by telephone, with their informed consent, and verified patient privacy and that I am speaking with the correct person using two identifiers.  I am private, in my office and the patient is at home.  I discussed the limitations, risks, security and privacy concerns of performing an evaluation and management service by telephone and the availability of in person appointments. I also discussed with the patient that there may be a patient responsible charge related to this service. The patient expressed understanding and agreed to proceed.   I discussed the assessment and treatment plan with the patient. The patient was provided an opportunity to ask questions and all were answered. The patient agreed with the plan and demonstrated an understanding of the instructions.   The patient was advised to call back or seek an in-person evaluation if the symptoms worsen or if the condition fails to improve as anticipated.  I provided 30 minutes of non-face-to-face time during this encounter.  HISTORY/CURRENT STATUS: HPI For routine med check.  "I need something for my ADHD. It's so bad, I can't focus on anything." Doesn't work outside the home. Watches a lot of tv. Can't concentrate enough to follow a program and that's frustrating.   Other than that she feels in her usual state of mental health and feels that her medications are working. Patient is able to enjoy things.  Energy and motivation are good.  No extreme sadness, tearfulness, or feelings of hopelessness.  Sleeps well most of the time, requires trazodone.  ADLs and personal hygiene are normal.   Denies any changes in memory.  Appetite has not  changed.  Weight is stable.  Has anxiety on a daily basis, feels that the preventative meds are helpful and Valium is effective when needed for rescue.  Denies suicidal or homicidal thoughts.  Patient denies increased energy with decreased need for sleep, increased talkativeness, racing thoughts, impulsivity or risky behaviors, increased spending, increased libido, grandiosity, increased irritability or anger, paranoia, or hallucinations.  Denies dizziness, syncope, seizures, numbness, tingling, tremor, tics, unsteady gait, slurred speech, confusion.  Has chronic headaches, neck and back pain. Denies frequent infections, or sores that heal slowly.  No polyphagia, polydipsia, or polyuria. Denies visual changes or paresthesias.   Individual Medical History/ Review of Systems: Changes? :Yes    had panniculectomy on 12/27/2022.  Has recovered from that well.  Past medications for mental health diagnoses include: Zoloft, trazodone, gabapentin, Risperdal, hydroxyzine, prazosin, nortriptyline, Adderall, Rexulti, Topamax, Latuda, Ingrezza lithium, Valium, clozapine, BuSpar, Zyprexa  Allergies: Clindamycin/lincomycin, Nsaids, Oxycodone, and Percocet [oxycodone-acetaminophen]  Current Medications:  Current Outpatient Medications:    AIMOVIG 70 MG/ML SOAJ, Inject 70 mg into the muscle every 30 (thirty) days., Disp: , Rfl: 11   alum & mag hydroxide-simeth (MAALOX MAX) 400-400-40 MG/5ML suspension, Take 15 mLs by mouth every 6 (six) hours as needed for indigestion., Disp: 355 mL, Rfl: 0   AMITIZA 24 MCG capsule, Take 24 mcg by mouth 2 (two) times daily with a meal. , Disp: , Rfl:    BOTOX 200 units injection, Inject 200 Units into the muscle every 3 (three) months., Disp: , Rfl:    buPROPion (WELLBUTRIN XL) 150 MG 24 hr tablet, Take 3 tablets (  450 mg total) by mouth daily., Disp: 90 tablet, Rfl: 1   busPIRone (BUSPAR) 30 MG tablet, TAKE 1 TABLET(30 MG) BY MOUTH TWICE DAILY, Disp: 180 tablet, Rfl: 1    Cyanocobalamin (B-12 IJ), Inject 1 Dose as directed once a week., Disp: , Rfl:    cyclobenzaprine (FLEXERIL) 10 MG tablet, Take 1 tablet (10 mg total) by mouth 3 (three) times daily as needed for muscle spasms. (Patient taking differently: Take 10 mg by mouth daily as needed for muscle spasms.), Disp: 270 tablet, Rfl: 0   diazepam (VALIUM) 5 MG tablet, Take 1 tablet (5 mg total) by mouth every 12 (twelve) hours as needed for anxiety., Disp: 60 tablet, Rfl: 2   ENULOSE 10 GM/15ML SOLN, Take 20 g by mouth in the morning and at bedtime., Disp: , Rfl:    fluticasone (FLONASE) 50 MCG/ACT nasal spray, Place 1 spray into both nostrils daily as needed for allergies or rhinitis., Disp: , Rfl:    fluvoxaMINE (LUVOX) 100 MG tablet, TAKE 2 TABLETS(200 MG) BY MOUTH AT BEDTIME, Disp: 180 tablet, Rfl: 1   gabapentin (NEURONTIN) 800 MG tablet, TAKE 1 TABLET BY MOUTH IN THE  MORNING AND AT LUNCH, AND TAKE 2 TABLETS EVERY NIGHT MAY CRUSH  AND MIX WITH APPLESAUCE /  PUDDING IF UNABLE TO SWALLOW TAB, Disp: 360 tablet, Rfl: 3   HYDROcodone-acetaminophen (NORCO/VICODIN) 5-325 MG tablet, Take 1 tablet by mouth every 6 (six) hours as needed., Disp: 10 tablet, Rfl: 0   hydrOXYzine (ATARAX/VISTARIL) 25 MG tablet, Take 1 tablet (25 mg total) by mouth 2 (two) times daily., Disp: 180 tablet, Rfl: 1   ipratropium-albuterol (DUONEB) 0.5-2.5 (3) MG/3ML SOLN, Inhale 3 mLs into the lungs every 6 (six) hours as needed (shortness of breath or wheezing)., Disp: , Rfl:    levothyroxine (SYNTHROID) 88 MCG tablet, Take 88 mcg by mouth daily before breakfast., Disp: , Rfl:    linaclotide (LINZESS) 290 MCG CAPS capsule, Take 290 mcg by mouth daily before breakfast., Disp: , Rfl:    montelukast (SINGULAIR) 10 MG tablet, Take 10 mg by mouth at bedtime. , Disp: , Rfl:    omeprazole (PRILOSEC) 40 MG capsule, Take 40 mg by mouth in the morning and at bedtime., Disp: , Rfl:    ondansetron (ZOFRAN-ODT) 4 MG disintegrating tablet, Take 4 mg by mouth  every 8 (eight) hours as needed for nausea. , Disp: , Rfl:    oxybutynin (DITROPAN) 5 MG tablet, Take 5 mg by mouth 2 (two) times daily., Disp: , Rfl:    prazosin (MINIPRESS) 1 MG capsule, TAKE 1 CAPSULE(1 MG) BY MOUTH AT BEDTIME, Disp: 90 capsule, Rfl: 1   tamsulosin (FLOMAX) 0.4 MG CAPS capsule, Take 1 capsule (0.4 mg total) by mouth daily., Disp: 30 capsule, Rfl: 0   traMADol (ULTRAM) 50 MG tablet, Take 100 mg by mouth every 6 (six) hours as needed for moderate pain., Disp: , Rfl:    traZODone (DESYREL) 100 MG tablet, TAKE 1 TO 1 AND 1/2 TABLETS(100 TO 150 MG) BY MOUTH AT BEDTIME AS NEEDED FOR SLEEP, Disp: 135 tablet, Rfl: 3   OLANZapine (ZYPREXA) 20 MG tablet, Take 1 tablet (20 mg total) by mouth at bedtime., Disp: 90 tablet, Rfl: 3   topiramate (TOPAMAX) 100 MG tablet, Take 2 tablets (200 mg total) by mouth 2 (two) times daily., Disp: 120 tablet, Rfl: 0 Medication Side Effects: none  Family Medical/ Social History: Changes?  No  MENTAL HEALTH EXAM:  There were no vitals taken for  this visit.There is no height or weight on file to calculate BMI.    General Appearance:  Unable to assess  Eye Contact:   Unable to assess  Speech:  Clear and Coherent and Normal Rate  Volume:  Normal  Mood:  Euthymic  Affect:   Unable to assess  Thought Process:  Goal Directed and Descriptions of Associations: Intact  Orientation:  Full (Time, Place, and Person)  Thought Content: Logical   Suicidal Thoughts:  No  Homicidal Thoughts:  No  Memory:  WNL  Judgement:  Good  Insight:  Good  Psychomotor Activity:   Unable to assess  Concentration:  Concentration: Fair and Attention Span: Good  Recall:  Good  Fund of Knowledge: Good  Language: Good  Assets:  Communication Skills Desire for Improvement Financial Resources/Insurance Housing  ADL's:  Intact  Cognition: WNL  Prognosis:  Good   DIAGNOSES:    ICD-10-CM   1. Bipolar depression  F31.9     2. Polypharmacy  Z79.899     3. PTSD  (post-traumatic stress disorder)  F43.10     4. Generalized anxiety disorder  F41.1     5. Insomnia, unspecified type  G47.00      Receiving Psychotherapy: Yes   Pervis Hocking  RECOMMENDATIONS:  PDMP was reviewed. Valium filled 01/20/2023.  Gabapentin filled 01/21/2023.  Hydrocodone known to me. I provided 30 minutes of non-face-to-face time during this encounter, including time spent before and after the visit in records review, medical decision making, counseling pertinent to today's visit, and charting.   We discussed the decreased attention.  I am not prescribing a stimulant and honestly do not think another medication should be added to treat that. She does not work outside the home and is not reasonable to prescribe a stimulant especially in order for her to focus on a TV show.  If anything, we should be trying to eliminate some of her medications that are sedating and can cause confusion and/or decreased mentation.  I went over these medications with her, Luvox, hydrocodone, Zyprexa, hydroxyzine, gabapentin, Valium, Topamax, tramadol, and trazodone can all affect the ability to focus and attention span, they may also cause confusion and decreased reaction time.  I recommend increasing Wellbutrin, at least it will not be adding another drug right now. This is a mild stimulant and hopefully will help.  If not, I will consider adding a nonstimulant medication for ADD.  Strongly recommend counseling to help with that as well.  She does not feel like she can decrease the doses or get off of most of these medications.  She would be willing to try weaning off Valium at some point in the future.  She states she is not taking the hydrocodone often right now, it was for postop pain.  At any rate, she is on approximately 25 medications, and while I do not doubt that they are needed, or have been in the past, I will not add to that list at this time.  Increase Wellbutrin XL to 450 mg p.o. every  morning. Continue BuSpar 30 mg, 1 p.o. twice daily. Continue Valium 5 mg, 1 p.o. twice daily as needed.   Continue Luvox 100 mg, 2 p.o. nightly. Continue gabapentin 400 mg, 2 p.o. every morning, 2 p.o. at lunch, and 4 p.o. nightly. Continue hydroxyzine 25 mg, 1 p.o. twice daily as needed. Continue Zyprexa 20 mg, 1 p.o. nightly. Continue prazosin 1 mg p.o. nightly. Continue Seroquel 50 mg, 1 p.o. as directed per neuro.  Continue Topamax 100 mg, 2 po bid. (She needs to get next Rx from neuro.) Continue trazodone 100 mg, 1.5 pills nightly as needed. Discuss attention issues with her therapist. Return in 6 weeks.  Melony Overly, PA-C

## 2023-03-06 ENCOUNTER — Other Ambulatory Visit: Payer: Self-pay | Admitting: Physician Assistant

## 2023-03-30 ENCOUNTER — Other Ambulatory Visit: Payer: Self-pay | Admitting: Physician Assistant

## 2023-04-09 ENCOUNTER — Ambulatory Visit (INDEPENDENT_AMBULATORY_CARE_PROVIDER_SITE_OTHER): Payer: 59 | Admitting: Physician Assistant

## 2023-04-09 ENCOUNTER — Encounter: Payer: Self-pay | Admitting: Physician Assistant

## 2023-04-09 VITALS — BP 96/54 | HR 73

## 2023-04-09 DIAGNOSIS — F319 Bipolar disorder, unspecified: Secondary | ICD-10-CM

## 2023-04-09 DIAGNOSIS — F4310 Post-traumatic stress disorder, unspecified: Secondary | ICD-10-CM

## 2023-04-09 DIAGNOSIS — Z79899 Other long term (current) drug therapy: Secondary | ICD-10-CM

## 2023-04-09 DIAGNOSIS — F411 Generalized anxiety disorder: Secondary | ICD-10-CM

## 2023-04-09 DIAGNOSIS — F900 Attention-deficit hyperactivity disorder, predominantly inattentive type: Secondary | ICD-10-CM

## 2023-04-09 DIAGNOSIS — I959 Hypotension, unspecified: Secondary | ICD-10-CM

## 2023-04-09 NOTE — Progress Notes (Signed)
[facility]  Patient ID: Morgan [name],  MRN: [id number]  PCP: Leane Call, PA-C  Date of Evaluation: 04/09/2023  Time spent:30 minutes  Chief Complaint:  Chief Complaint   Follow-up    HISTORY/CURRENT STATUS: HPI For routine med check.  States she's been passing out 'for a long time. Maybe years. But it's been worse lately.' Sleeps well most of the time. No nightmares. She's seeing her PCP about this the syncope.  Her PCP put her on Strattera for ADD, she was having trouble going to church or carry on a conversation with her husband or watch tv. States she's doing some better. Able to focus more. Only on it for 2-3 weeks.  Patient is able to enjoy things.  Energy and motivation are good.  No extreme sadness, tearfulness, or feelings of hopelessness.  ADLs and personal hygiene are normal.  Appetite has not changed.  Weight is stable.  Anxiety is controlled.  Rare PA.  Denies suicidal or homicidal thoughts.  Patient denies increased energy with decreased need for sleep, increased talkativeness, racing thoughts, impulsivity or risky behaviors, increased spending, increased libido, grandiosity, increased irritability or anger, paranoia, or hallucinations.  Denies dizziness, syncope, seizures, numbness, tingling, tremor, tics, unsteady gait, slurred speech, confusion.  Has chronic headaches, neck and back pain. Denies frequent infections, or sores that heal slowly.  No polyphagia, polydipsia, or polyuria. Denies visual changes or paresthesias.   Individual Medical History/ Review of Systems: Changes? :Yes    started on Strattera  Past medications for mental health diagnoses include: Zoloft, trazodone, gabapentin, Risperdal, hydroxyzine, prazosin, nortriptyline, Adderall, Rexulti, Topamax, Latuda, Ingrezza lithium, Valium, clozapine, BuSpar, Zyprexa  Allergies: Clindamycin/lincomycin, Nsaids, Oxycodone, and Percocet [oxycodone-acetaminophen]  Current  Medications:  Current Outpatient Medications:    AIMOVIG 70 MG/ML SOAJ, Inject 70 mg into the muscle every 30 (thirty) days., Disp: , Rfl: 11   alum & mag hydroxide-simeth (MAALOX MAX) 400-400-40 MG/5ML suspension, Take 15 mLs by mouth every 6 (six) hours as needed for indigestion., Disp: 355 mL, Rfl: 0   AMITIZA 24 MCG capsule, Take 24 mcg by mouth 2 (two) times daily with a meal. , Disp: , Rfl:    atomoxetine (STRATTERA) 25 MG capsule, Take 25 mg by mouth daily., Disp: , Rfl:    BOTOX 200 units injection, Inject 200 Units into the muscle every 3 (three) months., Disp: , Rfl:    buPROPion (WELLBUTRIN XL) 150 MG 24 hr tablet, TAKE 3 TABLETS(450 MG) BY MOUTH DAILY, Disp: 90 tablet, Rfl: 1   busPIRone (BUSPAR) 30 MG tablet, TAKE 1 TABLET(30 MG) BY MOUTH TWICE DAILY, Disp: 180 tablet, Rfl: 1   Cyanocobalamin (B-12 IJ), Inject 1 Dose as directed once a week., Disp: , Rfl:    cyclobenzaprine (FLEXERIL) 10 MG tablet, Take 1 tablet (10 mg total) by mouth 3 (three) times daily as needed for muscle spasms. (Patient taking differently: Take 10 mg by mouth daily as needed for muscle spasms.), Disp: 270 tablet, Rfl: 0   ENULOSE 10 GM/15ML SOLN, Take 20 g by mouth in the morning and at bedtime., Disp: , Rfl:    fluticasone (FLONASE) 50 MCG/ACT nasal spray, Place 1 spray into both nostrils daily as needed for allergies or rhinitis., Disp: , Rfl:    fluvoxaMINE (LUVOX) 100 MG tablet, TAKE 2 TABLETS(200 MG) BY MOUTH AT BEDTIME, Disp: 180 tablet, Rfl: 1   hydrOXYzine (ATARAX/VISTARIL) 25 MG tablet, Take 1 tablet (25 mg total) by mouth 2 (two) times daily., Disp: 180 tablet,  Rfl: 1   ipratropium-albuterol (DUONEB) 0.5-2.5 (3) MG/3ML SOLN, Inhale 3 mLs into the lungs every 6 (six) hours as needed (shortness of breath or wheezing)., Disp: , Rfl:    levothyroxine (SYNTHROID) 88 MCG tablet, Take 88 mcg by mouth daily before breakfast., Disp: , Rfl:    linaclotide (LINZESS) 290 MCG CAPS capsule, Take 290 mcg by mouth  daily before breakfast., Disp: , Rfl:    montelukast (SINGULAIR) 10 MG tablet, Take 10 mg by mouth at bedtime. , Disp: , Rfl:    OLANZapine (ZYPREXA) 20 MG tablet, Take 1 tablet (20 mg total) by mouth at bedtime., Disp: 90 tablet, Rfl: 3   omeprazole (PRILOSEC) 40 MG capsule, Take 40 mg by mouth in the morning and at bedtime., Disp: , Rfl:    ondansetron (ZOFRAN-ODT) 4 MG disintegrating tablet, Take 4 mg by mouth every 8 (eight) hours as needed for nausea. , Disp: , Rfl:    oxybutynin (DITROPAN) 5 MG tablet, Take 5 mg by mouth 2 (two) times daily., Disp: , Rfl:    tamsulosin (FLOMAX) 0.4 MG CAPS capsule, Take 1 capsule (0.4 mg total) by mouth daily., Disp: 30 capsule, Rfl: 0   topiramate (TOPAMAX) 100 MG tablet, Take 2 tablets (200 mg total) by mouth 2 (two) times daily., Disp: 120 tablet, Rfl: 0   traMADol (ULTRAM) 50 MG tablet, Take 100 mg by mouth every 6 (six) hours as needed for moderate pain., Disp: , Rfl:    traZODone (DESYREL) 100 MG tablet, TAKE 1 TO 1 AND 1/2 TABLETS(100 TO 150 MG) BY MOUTH AT BEDTIME AS NEEDED FOR SLEEP, Disp: 135 tablet, Rfl: 3   diazepam (VALIUM) 5 MG tablet, Take 1 tablet (5 mg total) by mouth every 12 (twelve) hours as needed for anxiety. (Patient not taking: Reported on 04/09/2023), Disp: 60 tablet, Rfl: 2   gabapentin (NEURONTIN) 800 MG tablet, TAKE 1 TABLET BY MOUTH IN THE  MORNING AND AT LUNCH, AND TAKE 2 TABLETS EVERY NIGHT MAY CRUSH  AND MIX WITH APPLESAUCE /  PUDDING IF UNABLE TO SWALLOW TAB, Disp: 360 tablet, Rfl: 3   HYDROcodone-acetaminophen (NORCO/VICODIN) 5-325 MG tablet, Take 1 tablet by mouth every 4 (four) hours as needed., Disp: 15 tablet, Rfl: 0   ondansetron (ZOFRAN) 4 MG tablet, Take 1 tablet (4 mg total) by mouth every 8 (eight) hours as needed for nausea or vomiting., Disp: 20 tablet, Rfl: 0   QUEtiapine (SEROQUEL) 50 MG tablet, Take 50 mg by mouth at bedtime., Disp: , Rfl:  Medication Side Effects: none  Family Medical/ Social History: Changes?   No  MENTAL HEALTH EXAM:  Blood pressure (!) 96/54, pulse 73.There is no height or weight on file to calculate BMI.    General Appearance: Casual and Well Groomed  Eye Contact:  Good  Speech:  Clear and Coherent and Normal Rate  Volume:  Normal  Mood:  Euthymic  Affect:  Congruent  Thought Process:  Goal Directed and Descriptions of Associations: Intact  Orientation:  Full (Time, Place, and Person)  Thought Content: Logical   Suicidal Thoughts:  No  Homicidal Thoughts:  No  Memory:  WNL  Judgement:  Good  Insight:  Good  Psychomotor Activity:  Normal  Concentration:  Concentration: Fair and Attention Span: Good  Recall:  Good  Fund of Knowledge: Good  Language: Good  Assets:  Communication Skills Desire for Improvement Financial Resources/Insurance Housing  ADL's:  Intact  Cognition: WNL  Prognosis:  Good   DIAGNOSES:  ICD-10-CM   1. Attention deficit hyperactivity disorder (ADHD), predominantly inattentive type  F90.0     2. Hypotension, unspecified hypotension type  I95.9     3. PTSD (post-traumatic stress disorder)  F43.10     4. Bipolar I disorder (HCC)  F31.9     5. Generalized anxiety disorder  F41.1     6. Polypharmacy  Z79.899       Receiving Psychotherapy: Yes   Pervis Hocking  RECOMMENDATIONS:  PDMP was reviewed.  Valium filled 01/20/2023.  Gabapentin 01/22/2023.  Hydrocodone known to me. I provided 30 minutes of face to face time during this encounter, including time spent before and after the visit in records review, medical decision making, counseling pertinent to today's visit, and charting.   Discussed the hypotension. The Prazosin might be decreasing BP so will d/c it. If nightmares recur, we'll decide at that point what to do. For the ADD, ok to continue the Strattera.   Continue Strattera  25 mg now, for another 2-3 weeks. Then increase to 40 mg if needed. PCP prescribed initially but I will give now per pt request.   Continue Wellbutrin XL 450  mg p.o. every morning. Continue BuSpar 30 mg, 1 p.o. twice daily. Continue Valium 5 mg, 1 p.o. twice daily as needed.  Rarely takes.  Continue Luvox 100 mg, 2 p.o. nightly. Continue gabapentin 400 mg, 2 p.o. every morning, 2 p.o. at lunch, and 4 p.o. nightly. Continue hydroxyzine 25 mg, 1 p.o. twice daily as needed. Continue Zyprexa 20 mg, 1 p.o. nightly. Continue Seroquel 50 mg, 1 p.o. as directed per neuro. Continue Topamax 100 mg, 2 po bid. (She needs to get next Rx from neuro.) Continue trazodone 100 mg, 1.5 pills nightly as needed. Continue therapy. Return in 6 weeks.  Melony Overly, PA-C

## 2023-04-13 ENCOUNTER — Emergency Department (HOSPITAL_BASED_OUTPATIENT_CLINIC_OR_DEPARTMENT_OTHER): Payer: 59

## 2023-04-13 ENCOUNTER — Emergency Department (HOSPITAL_BASED_OUTPATIENT_CLINIC_OR_DEPARTMENT_OTHER)
Admission: EM | Admit: 2023-04-13 | Discharge: 2023-04-13 | Disposition: A | Discharge status: Home / Self Care | Payer: 59 | Attending: Emergency Medicine | Admitting: Emergency Medicine

## 2023-04-13 ENCOUNTER — Encounter (HOSPITAL_BASED_OUTPATIENT_CLINIC_OR_DEPARTMENT_OTHER): Payer: Self-pay | Admitting: Emergency Medicine

## 2023-04-13 ENCOUNTER — Other Ambulatory Visit: Payer: Self-pay

## 2023-04-13 ENCOUNTER — Encounter: Payer: Self-pay | Admitting: Oncology

## 2023-04-13 DIAGNOSIS — R109 Unspecified abdominal pain: Secondary | ICD-10-CM | POA: Diagnosis present

## 2023-04-13 DIAGNOSIS — R1032 Left lower quadrant pain: Secondary | ICD-10-CM | POA: Insufficient documentation

## 2023-04-13 LAB — COMPREHENSIVE METABOLIC PANEL
ALT: 32 U/L (ref 0–44)
AST: 60 U/L — ABNORMAL HIGH (ref 15–41)
Albumin: 4.3 g/dL (ref 3.5–5.0)
Alkaline Phosphatase: 92 U/L (ref 38–126)
Anion gap: 9 (ref 5–15)
BUN: 20 mg/dL (ref 6–20)
CO2: 24 mmol/L (ref 22–32)
Calcium: 9.1 mg/dL (ref 8.9–10.3)
Chloride: 105 mmol/L (ref 98–111)
Creatinine, Ser: 0.93 mg/dL (ref 0.44–1.00)
GFR, Estimated: >60 mL/min (ref >60)
Glucose, Bld: 125 mg/dL — ABNORMAL HIGH (ref 70–99)
Potassium: 4 mmol/L (ref 3.5–5.1)
Sodium: 138 mmol/L (ref 135–145)
Total Bilirubin: 0.9 mg/dL (ref 0.3–1.2)
Total Protein: 6.8 g/dL (ref 6.5–8.1)

## 2023-04-13 LAB — CBC WITH DIFFERENTIAL/PLATELET
Abs Immature Granulocytes: 0 10*3/uL (ref 0.00–0.07)
Basophils Absolute: 0 10*3/uL (ref 0.0–0.1)
Basophils Relative: 1 %
Eosinophils Absolute: 0.4 10*3/uL (ref 0.0–0.5)
Eosinophils Relative: 8 %
HCT: 44.1 % (ref 36.0–46.0)
Hemoglobin: 14.5 g/dL (ref 12.0–15.0)
Immature Granulocytes: 0 %
Lymphocytes Relative: 39 %
Lymphs Abs: 2 10*3/uL (ref 0.7–4.0)
MCH: 29.4 pg (ref 26.0–34.0)
MCHC: 32.9 g/dL (ref 30.0–36.0)
MCV: 89.3 fL (ref 80.0–100.0)
Monocytes Absolute: 0.3 10*3/uL (ref 0.1–1.0)
Monocytes Relative: 5 %
Neutro Abs: 2.3 10*3/uL (ref 1.7–7.7)
Neutrophils Relative %: 47 %
Platelets: 181 10*3/uL (ref 150–400)
RBC: 4.94 MIL/uL (ref 3.87–5.11)
RDW: 12.6 % (ref 11.5–15.5)
WBC: 5 10*3/uL (ref 4.0–10.5)
nRBC: 0 % (ref 0.0–0.2)

## 2023-04-13 LAB — URINALYSIS, ROUTINE W REFLEX MICROSCOPIC
Bilirubin Urine: NEGATIVE
Glucose, UA: NEGATIVE mg/dL
Hgb urine dipstick: NEGATIVE
Ketones, ur: NEGATIVE mg/dL
Leukocytes,Ua: NEGATIVE
Nitrite: NEGATIVE
Protein, ur: NEGATIVE mg/dL
Specific Gravity, Urine: 1.01 (ref 1.005–1.030)
pH: 6 (ref 5.0–8.0)

## 2023-04-13 LAB — LIPASE, BLOOD: Lipase: 39 U/L (ref 11–51)

## 2023-04-13 MED ORDER — MORPHINE SULFATE (PF) 4 MG/ML IV SOLN
4.0000 mg | Freq: Once | INTRAVENOUS | Status: AC
  Administered 2023-04-13: 4 mg via INTRAVENOUS
  Filled 2023-04-13: qty 1

## 2023-04-13 MED ORDER — ONDANSETRON HCL 4 MG PO TABS: 4.0000 mg | ORAL_TABLET | Freq: Three times a day (TID) | ORAL | 0 refills | Status: DC | PRN

## 2023-04-13 MED ORDER — ONDANSETRON HCL 4 MG/2ML IJ SOLN
4.0000 mg | Freq: Once | INTRAMUSCULAR | Status: AC
  Administered 2023-04-13: 4 mg via INTRAVENOUS
  Filled 2023-04-13: qty 2

## 2023-04-13 MED ORDER — HYDROCODONE-ACETAMINOPHEN 5-325 MG PO TABS: 1.0000 | ORAL_TABLET | ORAL | 0 refills | Status: DC | PRN

## 2023-04-13 MED ORDER — SODIUM CHLORIDE 0.9 % IV BOLUS
1000.0000 mL | Freq: Once | INTRAVENOUS | Status: AC
  Administered 2023-04-13: 1000 mL via INTRAVENOUS

## 2023-04-13 NOTE — Discharge Instructions (Addendum)
You were evaluated today for left-sided flank pain.  Your workup was reassuring with no signs of any acute abnormalities at this time.  I have prescribed a short course of pain medication and nausea medication.  Please take as prescribed.  Please follow-up with your primary care provider.  If you develop any life-threatening symptoms please return to the emergency department.

## 2023-04-13 NOTE — ED Triage Notes (Signed)
Pt c/o LT flank pain, nausea since last night

## 2023-04-13 NOTE — ED Provider Notes (Signed)
[facility] Provider Note   CSN: [id number] Arrival date & time: 04/13/23  1411     History  Chief Complaint  Patient presents with   Flank Pain    Morgan [name] is a 52 y.o. female.  Patient presents to the hospital complaining of left-sided flank and abdominal pain which began yesterday.  Patient states the pain started gradually and has been increasing in severity.  She does endorse waves of pain.  She also endorses dysuria and nausea.  The patient states she has a history of multiple kidney stones and this feels similar to previous kidney stones.  She denies fevers, vomiting, diarrhea, constipation, chest pain, shortness of breath.  The patient states that she had gastric bypass previously and had surgery to remove excess skin in March of this year.  Past medical history significant for right-sided ureteral stones, iron deficiency anemia, major depressive disorder, PTSD, chronic pain, acid reflux  HPI     Home Medications Prior to Admission medications   Medication Sig Start Date End Date Taking? Authorizing Provider  HYDROcodone-acetaminophen (NORCO/VICODIN) 5-325 MG tablet Take 1 tablet by mouth every 4 (four) hours as needed. 04/13/23  Yes Barrie Dunker B, PA-C  ondansetron (ZOFRAN) 4 MG tablet Take 1 tablet (4 mg total) by mouth every 8 (eight) hours as needed for nausea or vomiting. 04/13/23  Yes Darrick Grinder, PA-C  AIMOVIG 70 MG/ML SOAJ Inject 70 mg into the muscle every 30 (thirty) days. 10/13/17   [provider]  alum & mag hydroxide-simeth (MAALOX MAX) 400-400-40 MG/5ML suspension Take 15 mLs by mouth every 6 (six) hours as needed for indigestion. 07/10/22   Kommor, Wyn Forster, MD  AMITIZA 24 MCG capsule Take 24 mcg by mouth 2 (two) times daily with a meal.  09/24/17   [provider]  atomoxetine (STRATTERA) 25 MG capsule Take 25 mg by mouth daily. 03/20/23 07/18/23  [provider]  BOTOX 200  units injection Inject 200 Units into the muscle every 3 (three) months. 06/24/22   [provider]  buPROPion (WELLBUTRIN XL) 150 MG 24 hr tablet TAKE 3 TABLETS(450 MG) BY MOUTH DAILY 03/30/23   Claybon Jabs, Rosey Bath T, PA-C  busPIRone (BUSPAR) 30 MG tablet TAKE 1 TABLET(30 MG) BY MOUTH TWICE DAILY 11/29/22   Hurst, Rosey Bath T, PA-C  Cyanocobalamin (B-12 IJ) Inject 1 Dose as directed once a week.    [provider]  cyclobenzaprine (FLEXERIL) 10 MG tablet Take 1 tablet (10 mg total) by mouth 3 (three) times daily as needed for muscle spasms. Patient taking differently: Take 10 mg by mouth daily as needed for muscle spasms. 11/04/18   Shugart, Mat Carne, PA-C  diazepam (VALIUM) 5 MG tablet Take 1 tablet (5 mg total) by mouth every 12 (twelve) hours as needed for anxiety. Patient not taking: Reported on 04/09/2023 11/29/22   Melony Overly T, PA-C  ENULOSE 10 GM/15ML SOLN Take 20 g by mouth in the morning and at bedtime. 05/09/22   [provider]  fluticasone (FLONASE) 50 MCG/ACT nasal spray Place 1 spray into both nostrils daily as needed for allergies or rhinitis.    [provider]  fluvoxaMINE (LUVOX) 100 MG tablet TAKE 2 TABLETS(200 MG) BY MOUTH AT BEDTIME 11/29/22   Hurst, Rosey Bath T, PA-C  gabapentin (NEURONTIN) 800 MG tablet TAKE 1 TABLET BY MOUTH IN THE  MORNING AND AT LUNCH, AND TAKE 2 TABLETS EVERY NIGHT MAY CRUSH  AND MIX WITH APPLESAUCE /  PUDDING  IF UNABLE TO SWALLOW TAB 01/12/23   Melony Overly T, PA-C  hydrOXYzine (ATARAX/VISTARIL) 25 MG tablet Take 1 tablet (25 mg total) by mouth 2 (two) times daily. 07/27/19   Mozingo, Thereasa Solo, NP  ipratropium-albuterol (DUONEB) 0.5-2.5 (3) MG/3ML SOLN Inhale 3 mLs into the lungs every 6 (six) hours as needed (shortness of breath or wheezing).    [provider]  levothyroxine (SYNTHROID) 88 MCG tablet Take 88 mcg by mouth daily before breakfast.    [provider]  linaclotide (LINZESS) 290 MCG CAPS capsule Take 290  mcg by mouth daily before breakfast.    [provider]  montelukast (SINGULAIR) 10 MG tablet Take 10 mg by mouth at bedtime.  02/05/16   [provider]  OLANZapine (ZYPREXA) 20 MG tablet Take 1 tablet (20 mg total) by mouth at bedtime. 02/28/23   Cherie Ouch, PA-C  omeprazole (PRILOSEC) 40 MG capsule Take 40 mg by mouth in the morning and at bedtime. 07/29/17   [provider]  ondansetron (ZOFRAN-ODT) 4 MG disintegrating tablet Take 4 mg by mouth every 8 (eight) hours as needed for nausea.  02/05/16   [provider]  oxybutynin (DITROPAN) 5 MG tablet Take 5 mg by mouth 2 (two) times daily. 07/09/22   [provider]  QUEtiapine (SEROQUEL) 50 MG tablet Take 50 mg by mouth at bedtime.    [provider]  tamsulosin (FLOMAX) 0.4 MG CAPS capsule Take 1 capsule (0.4 mg total) by mouth daily. 02/10/22   Honor Loh M, PA-C  topiramate (TOPAMAX) 100 MG tablet Take 2 tablets (200 mg total) by mouth 2 (two) times daily. 02/28/23   Melony Overly T, PA-C  traMADol (ULTRAM) 50 MG tablet Take 100 mg by mouth every 6 (six) hours as needed for moderate pain.    [provider]  traZODone (DESYREL) 100 MG tablet TAKE 1 TO 1 AND 1/2 TABLETS(100 TO 150 MG) BY MOUTH AT BEDTIME AS NEEDED FOR SLEEP 01/28/23   Melony Overly T, PA-C  sucralfate (CARAFATE) 1 g tablet Take by mouth. 07/16/22 07/31/22  [provider]      Allergies    Clindamycin/lincomycin, Nsaids, Oxycodone, and Percocet [oxycodone-acetaminophen]    Review of Systems   Review of Systems  Physical Exam Updated Vital Signs BP (!) 102/59 (BP Location: Right Arm)   Pulse 73   Temp 97.6 F (36.4 C)   Resp 18   Ht 5\' 3"  (1.6 m)   Wt 56.7 kg   SpO2 100%   BMI 22.14 kg/m  Physical Exam Vitals and nursing note reviewed.  Constitutional:      General: She is not in acute distress.    Appearance: She is well-developed.  HENT:     Head: Normocephalic and atraumatic.      Mouth/Throat:     Mouth: Mucous membranes are moist.  Eyes:     Conjunctiva/sclera: Conjunctivae normal.  Cardiovascular:     Rate and Rhythm: Normal rate and regular rhythm.     Heart sounds: No murmur heard. Pulmonary:     Effort: Pulmonary effort is normal. No respiratory distress.     Breath sounds: Normal breath sounds.  Abdominal:     Palpations: Abdomen is soft.     Tenderness: There is abdominal tenderness (Left lower quadrant). There is left CVA tenderness.  Musculoskeletal:        General: No swelling.     Cervical back: Neck supple.     Right lower leg: No  edema.     Left lower leg: No edema.  Skin:    General: Skin is warm and dry.     Capillary Refill: Capillary refill takes less than 2 seconds.  Neurological:     Mental Status: She is alert.  Psychiatric:        Mood and Affect: Mood normal.     ED Results / Procedures / Treatments   Labs (all labs ordered are listed, but only abnormal results are displayed) Labs Reviewed  COMPREHENSIVE METABOLIC PANEL - Abnormal; Notable for the following components:      Result Value   Glucose, Bld 125 (*)    AST 60 (*)    All other components within normal limits  CBC WITH DIFFERENTIAL/PLATELET  LIPASE, BLOOD  URINALYSIS, ROUTINE W REFLEX MICROSCOPIC    EKG None  Radiology CT RENAL STONE STUDY  Result Date: 04/13/2023 CLINICAL DATA:  Abdominal/flank pain, stone suspected. Left flank pain. EXAM: CT ABDOMEN AND PELVIS WITHOUT CONTRAST TECHNIQUE: Multidetector CT imaging of the abdomen and pelvis was performed following the standard protocol without IV contrast. RADIATION DOSE REDUCTION: This exam was performed according to the departmental dose-optimization program which includes automated exposure control, adjustment of the mA and/or kV according to patient size and/or use of iterative reconstruction technique. COMPARISON:  CT examination dated August 23, 2022 FINDINGS: Lower chest: No acute abnormality. Hepatobiliary:  No focal liver abnormality is seen. Status post cholecystectomy. No biliary dilatation. Pancreas: Unremarkable. No pancreatic ductal dilatation or surrounding inflammatory changes. Spleen: Normal in size without focal abnormality. Adrenals/Urinary Tract: Adrenal glands are unremarkable. Kidneys are normal, without renal calculi, focal lesion, or hydronephrosis. Bladder is unremarkable. Stomach/Bowel: Postsurgical changes for prior gastric bypass surgery. Bowel anastomosis in the mid abdomen. Appendix not identified. No evidence of bowel wall thickening, distention, or inflammatory changes. Vascular/Lymphatic: No significant vascular findings are present. No enlarged abdominal or pelvic lymph nodes. Reproductive: Status post hysterectomy. No adnexal masses. Other: No abdominal wall hernia or abnormality. No abdominopelvic ascites. Musculoskeletal: No acute or significant osseous findings. IMPRESSION: 1. No evidence of nephrolithiasis or hydronephrosis. 2. Postsurgical changes for prior gastric bypass surgery. No evidence of bowel obstruction or bowel wall thickening. 3. Status post cholecystectomy and hysterectomy. 4. No CT evidence of acute abdominal/pelvic process. Electronically Signed   By: Larose Hires D.O.   On: 04/13/2023 16:04    Procedures Procedures    Medications Ordered in ED Medications  morphine (PF) 4 MG/ML injection 4 mg (4 mg Intravenous Given 04/13/23 1443)  ondansetron (ZOFRAN) injection 4 mg (4 mg Intravenous Given 04/13/23 1443)  sodium chloride 0.9 % bolus 1,000 mL (0 mLs Intravenous Stopped 04/13/23 1614)    ED Course/ Medical Decision Making/ A&P                             Medical Decision Making Amount and/or Complexity of Data Reviewed Labs: ordered. Radiology: ordered.  Risk Prescription drug management.   This patient presents to the ED for concern of flank pain, this involves an extensive number of treatment options, and is a complaint that carries with it a high  risk of complications and morbidity.  The differential diagnosis includes nephrolithiasis, pyelonephritis, hydronephrosis, diverticulitis, musculoskeletal pain, others   Co morbidities that complicate the patient evaluation  Hx ureteral stones   Additional history obtained:  Additional history obtained from family at bedside  Lab Tests:  I Ordered, and personally interpreted labs.  The pertinent results  include: Unremarkable CMP, CBC, UA, lipase   Imaging Studies ordered:  I ordered imaging studies including CT renal stone study I independently visualized and interpreted imaging which showed  1. No evidence of nephrolithiasis or hydronephrosis.  2. Postsurgical changes for prior gastric bypass surgery. No  evidence of bowel obstruction or bowel wall thickening.  3. Status post cholecystectomy and hysterectomy.  4. No CT evidence of acute abdominal/pelvic process.   I agree with the radiologist interpretation     Problem List / ED Course / Critical interventions / Medication management   I ordered medication including morphine for pain, Zofran for nausea Reevaluation of the patient after these medicines showed that the patient improved I have reviewed the patients home medicines and have made adjustments as needed    Test / Admission - Considered:  Patient with no acute findings on CT scan, no significant abnormalities in lab work.  Unclear etiology of patient's pain.  Question if patient may have some sort of muscular strain but patient remembers no inciting event.  At this time vitals are stable and patient appears stable for discharge home.  Patient prescribed short course of pain medication and nausea medication.  Patient to follow-up with primary care for further evaluation and management as needed.  Return precautions provided         Final Clinical Impression(s) / ED Diagnoses Final diagnoses:  Left flank pain    Rx / DC Orders ED Discharge Orders           Ordered    HYDROcodone-acetaminophen (NORCO/VICODIN) 5-325 MG tablet  Every 4 hours PRN        04/13/23 1738    ondansetron (ZOFRAN) 4 MG tablet  Every 8 hours PRN        04/13/23 1738              Pamala Duffel 04/13/23 1740    Alvira Monday, MD 04/13/23 2257

## 2023-05-04 ENCOUNTER — Other Ambulatory Visit: Payer: Self-pay | Admitting: Physician Assistant

## 2023-05-13 ENCOUNTER — Other Ambulatory Visit: Payer: Self-pay | Admitting: Physician Assistant

## 2023-05-20 ENCOUNTER — Telehealth (INDEPENDENT_AMBULATORY_CARE_PROVIDER_SITE_OTHER): Payer: 59 | Admitting: Physician Assistant

## 2023-05-20 ENCOUNTER — Encounter: Payer: Self-pay | Admitting: Physician Assistant

## 2023-05-20 DIAGNOSIS — F411 Generalized anxiety disorder: Secondary | ICD-10-CM

## 2023-05-20 DIAGNOSIS — F4310 Post-traumatic stress disorder, unspecified: Secondary | ICD-10-CM | POA: Diagnosis not present

## 2023-05-20 DIAGNOSIS — F515 Nightmare disorder: Secondary | ICD-10-CM

## 2023-05-20 DIAGNOSIS — G4700 Insomnia, unspecified: Secondary | ICD-10-CM | POA: Diagnosis not present

## 2023-05-20 DIAGNOSIS — F319 Bipolar disorder, unspecified: Secondary | ICD-10-CM

## 2023-05-20 DIAGNOSIS — F4312 Post-traumatic stress disorder, chronic: Secondary | ICD-10-CM

## 2023-05-20 DIAGNOSIS — Z79899 Other long term (current) drug therapy: Secondary | ICD-10-CM

## 2023-05-20 MED ORDER — QUETIAPINE FUMARATE 100 MG PO TABS: 100.0000 mg | ORAL_TABLET | Freq: Every day | ORAL | 1 refills | Status: DC

## 2023-05-20 NOTE — Progress Notes (Signed)
[facility]  Patient ID: Morgan [name],  MRN: [id number]  PCP: Leane Call, PA-C  Date of Evaluation: 05/20/2023  Time spent: 22 minutes  Chief Complaint:  Chief Complaint   Follow-up    Virtual Visit via Telehealth  I connected with patient by a video enabled telemedicine application with their informed consent, and verified patient privacy and that I am speaking with the correct person using two identifiers.  I am private, in my office and the patient is at home.  I discussed the limitations, risks, security and privacy concerns of performing an evaluation and management service by video and the availability of in person appointments. I also discussed with the patient that there may be a patient responsible charge related to this service. The patient expressed understanding and agreed to proceed.   I discussed the assessment and treatment plan with the patient. The patient was provided an opportunity to ask questions and all were answered. The patient agreed with the plan and demonstrated an understanding of the instructions.   The patient was advised to call back or seek an in-person evaluation if the symptoms worsen or if the condition fails to improve as anticipated.  I provided 22  minutes of non-face-to-face time during this encounter.  HISTORY/CURRENT STATUS: HPI For routine med check.  At her last visit 6 weeks ago we stopped prazosin due to hypotension and syncope.  She has been having nightmares more often since going off the prazosin.  She has not had any further dizziness or syncopal spells though.  Most of the time she feels rested when she gets up.  Not having panic attacks but sometimes she does feel overwhelmed.  For the most part though she feels that the anxiety is controlled.  No obsessions.   Patient is able to enjoy things.  Energy and motivation are good.  Does not work outside the home.  No extreme sadness, tearfulness, or feelings of  hopelessness.   ADLs and personal hygiene are normal.    Appetite has not changed.  Denies suicidal or homicidal thoughts.  Patient denies increased energy with decreased need for sleep, increased talkativeness, racing thoughts, impulsivity or risky behaviors, increased spending, increased libido, grandiosity, increased irritability or anger, paranoia, or hallucinations.  Denies dizziness, syncope, seizures, numbness, tingling, tremor, tics, unsteady gait, slurred speech, confusion. Denies muscle or joint pain, stiffness, or dystonia.  Individual Medical History/ Review of Systems: Changes? :Yes    PCP increased Strattera since LOV.  States it has really helped her with focus and attention.  Past medications for mental health diagnoses include: Zoloft, trazodone, gabapentin, Risperdal, hydroxyzine, prazosin, nortriptyline, Adderall, Rexulti, Topamax, Latuda, Ingrezza lithium, Valium, clozapine, BuSpar, Zyprexa  Allergies: Clindamycin/lincomycin, Nsaids, Oxycodone, and Percocet [oxycodone-acetaminophen]  Current Medications:  Current Outpatient Medications:    AIMOVIG 70 MG/ML SOAJ, Inject 70 mg into the muscle every 30 (thirty) days., Disp: , Rfl: 11   alum & mag hydroxide-simeth (MAALOX MAX) 400-400-40 MG/5ML suspension, Take 15 mLs by mouth every 6 (six) hours as needed for indigestion., Disp: 355 mL, Rfl: 0   AMITIZA 24 MCG capsule, Take 24 mcg by mouth 2 (two) times daily with a meal. , Disp: , Rfl:    atomoxetine (STRATTERA) 25 MG capsule, Take 40 mg by mouth daily., Disp: , Rfl:    BOTOX 200 units injection, Inject 200 Units into the muscle every 3 (three) months., Disp: , Rfl:    buPROPion (WELLBUTRIN XL) 150 MG 24 hr tablet, TAKE 3 TABLETS(450  MG) BY MOUTH DAILY, Disp: 90 tablet, Rfl: 1   busPIRone (BUSPAR) 30 MG tablet, TAKE 1 TABLET(30 MG) BY MOUTH TWICE DAILY, Disp: 180 tablet, Rfl: 1   Cyanocobalamin (B-12 IJ), Inject 1 Dose as directed once a week., Disp: , Rfl:    cyclobenzaprine  (FLEXERIL) 10 MG tablet, Take 1 tablet (10 mg total) by mouth 3 (three) times daily as needed for muscle spasms. (Patient taking differently: Take 10 mg by mouth daily as needed for muscle spasms.), Disp: 270 tablet, Rfl: 0   ENULOSE 10 GM/15ML SOLN, Take 20 g by mouth in the morning and at bedtime., Disp: , Rfl:    fluticasone (FLONASE) 50 MCG/ACT nasal spray, Place 1 spray into both nostrils daily as needed for allergies or rhinitis., Disp: , Rfl:    fluvoxaMINE (LUVOX) 100 MG tablet, TAKE 2 TABLETS(200 MG) BY MOUTH AT BEDTIME, Disp: 180 tablet, Rfl: 1   gabapentin (NEURONTIN) 400 MG capsule, TAKE 2 CAPSULES BY MOUTH IN THE MORNING, 2 CAPSULES AT LUNCH AND 4 CAPSULES AT BEDTIME, Disp: 720 capsule, Rfl: 0   HYDROcodone-acetaminophen (NORCO/VICODIN) 5-325 MG tablet, Take 1 tablet by mouth every 4 (four) hours as needed., Disp: 15 tablet, Rfl: 0   hydrOXYzine (ATARAX/VISTARIL) 25 MG tablet, Take 1 tablet (25 mg total) by mouth 2 (two) times daily., Disp: 180 tablet, Rfl: 1   ipratropium-albuterol (DUONEB) 0.5-2.5 (3) MG/3ML SOLN, Inhale 3 mLs into the lungs every 6 (six) hours as needed (shortness of breath or wheezing)., Disp: , Rfl:    levothyroxine (SYNTHROID) 88 MCG tablet, Take 88 mcg by mouth daily before breakfast., Disp: , Rfl:    linaclotide (LINZESS) 290 MCG CAPS capsule, Take 290 mcg by mouth daily before breakfast., Disp: , Rfl:    montelukast (SINGULAIR) 10 MG tablet, Take 10 mg by mouth at bedtime. , Disp: , Rfl:    OLANZapine (ZYPREXA) 20 MG tablet, Take 1 tablet (20 mg total) by mouth at bedtime., Disp: 90 tablet, Rfl: 3   omeprazole (PRILOSEC) 40 MG capsule, Take 40 mg by mouth in the morning and at bedtime., Disp: , Rfl:    ondansetron (ZOFRAN) 4 MG tablet, Take 1 tablet (4 mg total) by mouth every 8 (eight) hours as needed for nausea or vomiting., Disp: 20 tablet, Rfl: 0   ondansetron (ZOFRAN-ODT) 4 MG disintegrating tablet, Take 4 mg by mouth every 8 (eight) hours as needed for  nausea. , Disp: , Rfl:    oxybutynin (DITROPAN) 5 MG tablet, Take 5 mg by mouth 2 (two) times daily., Disp: , Rfl:    QUEtiapine (SEROQUEL) 100 MG tablet, Take 1 tablet (100 mg total) by mouth at bedtime., Disp: 90 tablet, Rfl: 1   tamsulosin (FLOMAX) 0.4 MG CAPS capsule, Take 1 capsule (0.4 mg total) by mouth daily., Disp: 30 capsule, Rfl: 0   topiramate (TOPAMAX) 100 MG tablet, Take 2 tablets (200 mg total) by mouth 2 (two) times daily., Disp: 120 tablet, Rfl: 0   traMADol (ULTRAM) 50 MG tablet, Take 100 mg by mouth every 6 (six) hours as needed for moderate pain., Disp: , Rfl:    traZODone (DESYREL) 100 MG tablet, TAKE 1 TO 1 AND 1/2 TABLETS(100 TO 150 MG) BY MOUTH AT BEDTIME AS NEEDED FOR SLEEP, Disp: 135 tablet, Rfl: 3   diazepam (VALIUM) 5 MG tablet, Take 1 tablet (5 mg total) by mouth every 12 (twelve) hours as needed for anxiety. (Patient not taking: Reported on 04/09/2023), Disp: 60 tablet, Rfl: 2 Medication Side Effects:  none  Family Medical/ Social History: Changes?  No  MENTAL HEALTH EXAM:  There were no vitals taken for this visit.There is no height or weight on file to calculate BMI.    General Appearance: Casual and Well Groomed  Eye Contact:  Good  Speech:  Clear and Coherent and Normal Rate  Volume:  Normal  Mood:  Euthymic  Affect:  Congruent  Thought Process:  Goal Directed and Descriptions of Associations: Intact  Orientation:  Full (Time, Place, and Person)  Thought Content: Logical   Suicidal Thoughts:  No  Homicidal Thoughts:  No  Memory:  WNL  Judgement:  Good  Insight:  Good  Psychomotor Activity:  Normal  Concentration:  Concentration: Good and Attention Span: Good  Recall:  Good  Fund of Knowledge: Good  Language: Good  Assets:  Communication Skills Desire for Improvement Financial Resources/Insurance Housing  ADL's:  Intact  Cognition: WNL  Prognosis:  Good   DIAGNOSES:    ICD-10-CM   1. Bipolar I disorder (HCC)  F31.9     2. Generalized  anxiety disorder  F41.1     3. Insomnia, unspecified type  G47.00     4. PTSD (post-traumatic stress disorder)  F43.10     5. 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 Polypharmacy  Z79.899       Receiving Psychotherapy: Yes   Pervis Hocking  RECOMMENDATIONS:  PDMP was reviewed.  Valium filled 01/20/2023.  Gabapentin 05/05/2023.  Hydrocodone known to me. I provided 22 minutes of non-face-to-face time during this encounter, including time spent before and after the visit in records review, medical decision making, counseling pertinent to today's visit, and charting.   She is doing better all the way around except for her nightmares which have worsened since going off prazosin.  The low dose Seroquel has been helping with sleep but I recommend increasing it to hopefully help with nightmares as well.  She is also on Zyprexa. Two AAP are not usually prescribed unless absolutely necessary and in this case it is.  She understands that the possibility of movement disorders increases.  She knows what to watch for and will let me know if any new side effects arise.  Continue Strattera 40 mg daily per PCP.   Continue Wellbutrin XL 450 mg p.o. every morning. Continue BuSpar 30 mg, 1 p.o. twice daily. Continue Valium 5 mg, 1 p.o. twice daily as needed.  Rarely takes.  Continue Luvox 100 mg, 2 p.o. nightly. Continue gabapentin 400 mg, 2 p.o. every morning, 2 p.o. at lunch, and 4 p.o. nightly. Continue hydroxyzine 25 mg, 1 p.o. twice daily as needed. Continue Zyprexa 20 mg, 1 p.o. nightly. Increase Seroquel to 100 mg, 1 p.o. nightly.  Occasionally she can increase to 1.5 pills if she needs it to help her sleep. Continue Topamax 100 mg, 2 po bid. (She needs to get next Rx from neuro.) Continue trazodone 100 mg, 1.5 pills nightly as needed. Continue therapy. Return in 6 weeks.     Melony Overly, PA-C

## 2023-06-12 ENCOUNTER — Other Ambulatory Visit: Payer: Self-pay | Admitting: Physician Assistant

## 2023-08-13 ENCOUNTER — Encounter: Payer: Self-pay | Admitting: Oncology

## 2023-08-28 ENCOUNTER — Other Ambulatory Visit: Payer: Self-pay | Admitting: Physician Assistant

## 2023-10-01 ENCOUNTER — Other Ambulatory Visit: Payer: Self-pay | Admitting: Physician Assistant

## 2023-11-27 ENCOUNTER — Other Ambulatory Visit: Payer: Self-pay | Admitting: Physician Assistant

## 2023-11-30 NOTE — Telephone Encounter (Signed)
 Verify with patient dose of gabapentin

## 2023-12-04 ENCOUNTER — Encounter: Payer: Self-pay | Admitting: Oncology

## 2023-12-11 ENCOUNTER — Ambulatory Visit (INDEPENDENT_AMBULATORY_CARE_PROVIDER_SITE_OTHER): Payer: Medicare HMO | Admitting: Physician Assistant

## 2023-12-11 ENCOUNTER — Encounter: Payer: Self-pay | Admitting: Physician Assistant

## 2023-12-11 DIAGNOSIS — Z79899 Other long term (current) drug therapy: Secondary | ICD-10-CM

## 2023-12-11 DIAGNOSIS — F4310 Post-traumatic stress disorder, unspecified: Secondary | ICD-10-CM | POA: Diagnosis not present

## 2023-12-11 DIAGNOSIS — G4700 Insomnia, unspecified: Secondary | ICD-10-CM

## 2023-12-11 DIAGNOSIS — F411 Generalized anxiety disorder: Secondary | ICD-10-CM | POA: Diagnosis not present

## 2023-12-11 DIAGNOSIS — F319 Bipolar disorder, unspecified: Secondary | ICD-10-CM | POA: Diagnosis not present

## 2023-12-11 MED ORDER — QUETIAPINE FUMARATE 100 MG PO TABS: 100.0000 mg | ORAL_TABLET | Freq: Every day | ORAL | 1 refills | Status: DC

## 2023-12-11 MED ORDER — BUPROPION HCL ER (XL) 150 MG PO TB24: 450.0000 mg | ORAL_TABLET | Freq: Every day | ORAL | 1 refills | Status: DC

## 2023-12-11 MED ORDER — BUSPIRONE HCL 30 MG PO TABS: ORAL_TABLET | ORAL | 1 refills | Status: DC

## 2023-12-11 MED ORDER — GABAPENTIN 400 MG PO CAPS: ORAL_CAPSULE | ORAL | 1 refills | Status: DC

## 2023-12-11 MED ORDER — TOPIRAMATE 100 MG PO TABS: 200.0000 mg | ORAL_TABLET | Freq: Two times a day (BID) | ORAL | 1 refills | Status: DC

## 2023-12-11 MED ORDER — FLUVOXAMINE MALEATE 100 MG PO TABS: ORAL_TABLET | ORAL | 1 refills | Status: DC

## 2023-12-11 NOTE — Progress Notes (Signed)
[facility]  Patient ID: Morgan [name],  MRN: [id number]  PCP: Leane Call, PA-C  Date of Evaluation: 12/11/2023  Time spent:25 minutes  Chief Complaint:  Chief Complaint   Anxiety; Depression; Insomnia; Follow-up    HISTORY/CURRENT STATUS: HPI For routine med check.  For the most part, she's doing well.  Patient is able to enjoy things some days.  Energy and motivation are fair to good, depending on how she feels physically. No extreme sadness, tearfulness, or feelings of hopelessness.  Sleep sok with the Traz and Seroquel. ADLs and personal hygiene are normal.   Denies any changes in concentration, making decisions, or remembering things.  Appetite has not changed.  Anxiety is controlled. She knows her triggers and avoids them if possible. Denies suicidal or homicidal thoughts.  Patient denies increased energy with decreased need for sleep, increased talkativeness, racing thoughts, impulsivity or risky behaviors, increased spending, increased libido, grandiosity, increased irritability or anger, paranoia, or hallucinations.  Denies dizziness, syncope, seizures, numbness, tingling, tremor, tics, unsteady gait, slurred speech, confusion. Denies muscle or joint pain, stiffness, or dystonia. Denies unexplained weight loss, frequent infections, or sores that heal slowly.  No polyphagia, polydipsia, or polyuria. Denies visual changes or paresthesias.   Individual Medical History/ Review of Systems: Changes? :No      Past medications for mental health diagnoses include: Zoloft, trazodone, gabapentin, Risperdal, hydroxyzine, prazosin, nortriptyline, Adderall, Rexulti, Topamax, Latuda, Ingrezza lithium, Valium, clozapine, BuSpar, Zyprexa  Allergies: Clindamycin/lincomycin, Nsaids, Oxycodone, and Percocet [oxycodone-acetaminophen]  Current Medications:  Current Outpatient Medications:    AIMOVIG 70 MG/ML SOAJ, Inject 70 mg into the muscle every 30 (thirty) days.,  Disp: , Rfl: 11   alum & mag hydroxide-simeth (MAALOX MAX) 400-400-40 MG/5ML suspension, Take 15 mLs by mouth every 6 (six) hours as needed for indigestion., Disp: 355 mL, Rfl: 0   AMITIZA 24 MCG capsule, Take 24 mcg by mouth 2 (two) times daily with a meal. , Disp: , Rfl:    BOTOX 200 units injection, Inject 200 Units into the muscle every 3 (three) months., Disp: , Rfl:    Cyanocobalamin (B-12 IJ), Inject 1 Dose as directed once a week., Disp: , Rfl:    cyclobenzaprine (FLEXERIL) 10 MG tablet, Take 1 tablet (10 mg total) by mouth 3 (three) times daily as needed for muscle spasms. (Patient taking differently: Take 10 mg by mouth daily as needed for muscle spasms.), Disp: 270 tablet, Rfl: 0   ENULOSE 10 GM/15ML SOLN, Take 20 g by mouth in the morning and at bedtime., Disp: , Rfl:    fluticasone (FLONASE) 50 MCG/ACT nasal spray, Place 1 spray into both nostrils daily as needed for allergies or rhinitis., Disp: , Rfl:    HYDROcodone-acetaminophen (NORCO/VICODIN) 5-325 MG tablet, Take 1 tablet by mouth every 4 (four) hours as needed., Disp: 15 tablet, Rfl: 0   ipratropium-albuterol (DUONEB) 0.5-2.5 (3) MG/3ML SOLN, Inhale 3 mLs into the lungs every 6 (six) hours as needed (shortness of breath or wheezing)., Disp: , Rfl:    levothyroxine (SYNTHROID) 88 MCG tablet, Take 88 mcg by mouth daily before breakfast., Disp: , Rfl:    linaclotide (LINZESS) 290 MCG CAPS capsule, Take 290 mcg by mouth daily before breakfast., Disp: , Rfl:    montelukast (SINGULAIR) 10 MG tablet, Take 10 mg by mouth at bedtime. , Disp: , Rfl:    OLANZapine (ZYPREXA) 20 MG tablet, Take 1 tablet (20 mg total) by mouth at bedtime., Disp: 90 tablet, Rfl: 3   omeprazole (  PRILOSEC) 40 MG capsule, Take 40 mg by mouth in the morning and at bedtime., Disp: , Rfl:    ondansetron (ZOFRAN) 4 MG tablet, Take 1 tablet (4 mg total) by mouth every 8 (eight) hours as needed for nausea or vomiting., Disp: 20 tablet, Rfl: 0   ondansetron (ZOFRAN-ODT) 4  MG disintegrating tablet, Take 4 mg by mouth every 8 (eight) hours as needed for nausea. , Disp: , Rfl:    oxybutynin (DITROPAN) 5 MG tablet, Take 5 mg by mouth 2 (two) times daily., Disp: , Rfl:    tamsulosin (FLOMAX) 0.4 MG CAPS capsule, Take 1 capsule (0.4 mg total) by mouth daily., Disp: 30 capsule, Rfl: 0   traMADol (ULTRAM) 50 MG tablet, Take 100 mg by mouth every 6 (six) hours as needed for moderate pain., Disp: , Rfl:    traZODone (DESYREL) 100 MG tablet, TAKE 1 AND 1/2 TABLETS BY MOUTH  AT BEDTIME AS NEEDED FOR SLEEP, Disp: 135 tablet, Rfl: 3   atomoxetine (STRATTERA) 25 MG capsule, Take 40 mg by mouth daily., Disp: , Rfl:    buPROPion (WELLBUTRIN XL) 150 MG 24 hr tablet, Take 3 tablets (450 mg total) by mouth daily., Disp: 270 tablet, Rfl: 1   busPIRone (BUSPAR) 30 MG tablet, TAKE 1 TABLET(30 MG) BY MOUTH TWICE DAILY, Disp: 180 tablet, Rfl: 1   diazepam (VALIUM) 5 MG tablet, Take 1 tablet (5 mg total) by mouth every 12 (twelve) hours as needed for anxiety. (Patient not taking: Reported on 12/11/2023), Disp: 60 tablet, Rfl: 2   fluvoxaMINE (LUVOX) 100 MG tablet, TAKE 2 TABLETS(200 MG) BY MOUTH AT BEDTIME, Disp: 180 tablet, Rfl: 1   gabapentin (NEURONTIN) 400 MG capsule, TAKE 2 CAPSULES BY MOUTH IN THE MORNING, 2 CAPSULES AT  LUNCH, AND 4 CAPSULES AT  BEDTIME, Disp: 720 capsule, Rfl: 1   hydrOXYzine (ATARAX/VISTARIL) 25 MG tablet, Take 1 tablet (25 mg total) by mouth 2 (two) times daily. (Patient not taking: Reported on 12/11/2023), Disp: 180 tablet, Rfl: 1   QUEtiapine (SEROQUEL) 100 MG tablet, Take 1 tablet (100 mg total) by mouth at bedtime., Disp: 90 tablet, Rfl: 1   topiramate (TOPAMAX) 100 MG tablet, Take 2 tablets (200 mg total) by mouth 2 (two) times daily., Disp: 360 tablet, Rfl: 1 Medication Side Effects: none  Family Medical/ Social History: Changes?  No  MENTAL HEALTH EXAM:  There were no vitals taken for this visit.There is no height or weight on file to calculate BMI.     General Appearance: Casual and Well Groomed  Eye Contact:  Good  Speech:  Clear and Coherent and Normal Rate  Volume:  Normal  Mood:  Euthymic  Affect:  Congruent  Thought Process:  Goal Directed and Descriptions of Associations: Intact  Orientation:  Full (Time, Place, and Person)  Thought Content: Logical   Suicidal Thoughts:  No  Homicidal Thoughts:  No  Memory:  WNL  Judgement:  Good  Insight:  Good  Psychomotor Activity:  Normal  Concentration:  Concentration: Good and Attention Span: Good  Recall:  Good  Fund of Knowledge: Good  Language: Good  Assets:  Communication Skills Desire for Improvement Financial Resources/Insurance Housing Leisure Time  ADL's:  Intact  Cognition: WNL  Prognosis:  Good   PCP follows labs  DIAGNOSES:    ICD-10-CM   1. Bipolar I disorder (HCC)  F31.9     2. Generalized anxiety disorder  F41.1     3. Insomnia, unspecified type  G47.00  4. PTSD (post-traumatic stress disorder)  F43.10     5. Polypharmacy  Z79.899      Receiving Psychotherapy: Yes   Pervis Hocking  RECOMMENDATIONS:  PDMP was reviewed.  Valium filled 01/20/2023.  Gabapentin 05/05/2023.  Hydrocodone known to me. I provided 25 minutes of face to face time during this encounter, including time spent before and after the visit in records review, medical decision making, counseling pertinent to today's visit, and charting.   She's stable w/ current meds so no changes will be made.   Continue Strattera 40 mg daily per PCP.   Continue Wellbutrin XL 450 mg p.o. every morning. Continue BuSpar 30 mg, 1 p.o. twice daily. Continue Valium 5 mg, 1 p.o. twice daily as needed.  Rarely takes.  Continue Luvox 100 mg, 2 p.o. nightly. Continue gabapentin 400 mg, 2 p.o. every morning, 2 p.o. at lunch, and 4 p.o. nightly.  Continue hydroxyzine 25 mg, 1 p.o. twice daily as needed. Continue Zyprexa 20 mg, 1 p.o. nightly. Continue Seroquel 100 mg, 1 p.o. nightly.  (For sleep) Continue  Topamax 100 mg, 2 po bid. (She needs to get next Rx from neuro.) Continue trazodone 100 mg, 1.5 pills nightly as needed. Continue therapy. Return in 3 months.   Melony Overly, PA-C

## 2023-12-21 ENCOUNTER — Ambulatory Visit (HOSPITAL_BASED_OUTPATIENT_CLINIC_OR_DEPARTMENT_OTHER)
Admission: EM | Admit: 2023-12-21 | Discharge: 2023-12-21 | Disposition: A | Discharge status: Home / Self Care | Payer: Medicare HMO | Attending: Family Medicine | Admitting: Family Medicine

## 2023-12-21 ENCOUNTER — Encounter: Payer: Self-pay | Admitting: Oncology

## 2023-12-21 ENCOUNTER — Encounter (HOSPITAL_BASED_OUTPATIENT_CLINIC_OR_DEPARTMENT_OTHER): Payer: Self-pay

## 2023-12-21 DIAGNOSIS — J069 Acute upper respiratory infection, unspecified: Secondary | ICD-10-CM

## 2023-12-21 LAB — POCT INFLUENZA A/B
Influenza A, POC: NEGATIVE
Influenza B, POC: NEGATIVE

## 2023-12-21 MED ORDER — ONDANSETRON 4 MG PO TBDP: 4.0000 mg | ORAL_TABLET | Freq: Three times a day (TID) | ORAL | 0 refills | Status: DC | PRN

## 2023-12-21 NOTE — ED Triage Notes (Signed)
Abd cramps, nausea, ear pressure, headache onset yesterday afternoon. Patient has hx of a traumatic brain injury and migraines so used to headaches but reports "this is different".

## 2023-12-21 NOTE — ED Provider Notes (Signed)
[facility]    CSN: [id number] Arrival date & time: 12/21/23  0800      History   Chief Complaint Chief Complaint  Patient presents with   Nasal Congestion   ear pressure   Nausea   Abdominal Pain    HPI Morgan [name] is a 53 y.o. female.    Abdominal Pain Abdominal cramps, nausea, headache, and mild cough and congestion.  She is also had some pressure in her left ear.  Symptoms began yesterday.  She had subjective fever and chills last night.  No vomiting and no diarrhea.  Last bowel movement was yesterday.  She is still passing gas this morning.  She has had a gastric bypass.  She cannot take NSAIDs and she is allergic to clindamycin.  She also has side effects with oxycodone  Past medical history is also significant for migraines and traumatic brain injury  No known exposures  Past Medical History:  Diagnosis Date   Acid reflux    Anemia    Anxiety    Depression    Family history of adverse reaction to anesthesia    sister has PONV   Fibromyalgia    H. pylori infection    hx of , none now   Headache    migraines, uses imitrex as needed   Hematuria    History of kidney stones    Hypothyroidism    PONV (postoperative nausea and vomiting)    Renal calculi BILATERAL   Right ureteral stone    RLS (restless legs syndrome)    TBI (traumatic brain injury) (HCC) 04/12/2015    Patient Active Problem List   Diagnosis Date Noted   Hypokalemia 08/01/2022    Class: Diagnosis of   Iron deficiency anemia 07/31/2022    Class: Chronic   B12 deficiency anemia 07/26/2022    Class: Chronic   Chronic pain 07/27/2019   Head trauma 01/29/2018   Major depressive disorder, recurrent, 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   PTSD (post-traumatic stress disorder) 03/13/2016   Chronic post-traumatic stress disorder (PTSD) 03/13/2016   Depression    Suicidal ideation    Ureteral calculus 12/26/2014   Ureteral calculus, right 03/31/2013     Past Surgical History:  Procedure Laterality Date   ABDOMINAL HYSTERECTOMY     CHOLECYSTECTOMY     CYSTOSCOPY WITH RETROGRADE PYELOGRAM, URETEROSCOPY AND STENT PLACEMENT Left 12/26/2014   Procedure: CYSTOSCOPY WITH RETROGRADE PYELOGRAM, URETEROSCOPY AND STENT PLACEMENT;  Surgeon: Valetta Fuller, MD;  Location[facility];  Service: Urology;  Laterality: Left;   CYSTOSCOPY WITH RETROGRADE PYELOGRAM, URETEROSCOPY AND STENT PLACEMENT Left 02/11/2022   Procedure: CYSTOSCOPY WITH RETROGRADE PYELOGRAM, URETEROSCOPY AND STENT PLACEMENT;  Surgeon: Bjorn Pippin, MD;  Location: [facility];  Service: Urology;  Laterality: Left;   CYSTOSCOPY WITH RETROGRADE PYELOGRAM, URETEROSCOPY AND STENT PLACEMENT Right 07/12/2022   Procedure: CYSTOSCOPY WITH RIGHT RETROGRADE PYELOGRAM, URETEROSCOPY HOLMIUM LASER;  Surgeon: Bjorn Pippin, MD;  Location: [facility];  Service: Urology;  Laterality: Right;  60 MINS FOR CASE   CYSTOSCOPY/RETROGRADE/URETEROSCOPY Right 03/31/2013   Procedure: CYSTOSCOPY/RETROGRADE/URETEROSCOPY;  Surgeon: Valetta Fuller, MD;  Location: [facility];  Service: Urology;  Laterality: Right;   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   HOLMIUM LASER APPLICATION Right 03/31/2013   Procedure: HOLMIUM LASER APPLICATION;  Surgeon: Valetta Fuller, MD;  Location: [facility];  Service: Urology;  Laterality: Right;   HOLMIUM LASER APPLICATION Left 12/26/2014   Procedure: HOLMIUM LASER APPLICATION;  Surgeon: Onalee Hua  Ellin Goodie, MD;  Location: [facility];  Service: Urology;  Laterality: Left;   LAPAROSCOPIC ASSISTED VAGINAL HYSTERECTOMY  MARCH 2014   W/ BILATERAL SALPINGOOPHORECTOMY   LAPAROSCOPIC GASTRIC BANDING  SEPT 2011   TONSILLECTOMY AND ADENOIDECTOMY  AGE 29'S   URETEROLITHOTOMY  NOV 2013    OB History   No obstetric history on file.      Home Medications    Prior to Admission medications   Medication Sig Start Date End Date Taking? Authorizing Provider   ondansetron (ZOFRAN-ODT) 4 MG disintegrating tablet Take 1 tablet (4 mg total) by mouth every 8 (eight) hours as needed for nausea or vomiting. 12/21/23  Yes Zenia Resides, MD  AIMOVIG 70 MG/ML SOAJ Inject 70 mg into the muscle every 30 (thirty) days. 10/13/17   [provider]  alum & mag hydroxide-simeth (MAALOX MAX) 400-400-40 MG/5ML suspension Take 15 mLs by mouth every 6 (six) hours as needed for indigestion. 07/10/22   Kommor, Wyn Forster, MD  AMITIZA 24 MCG capsule Take 24 mcg by mouth 2 (two) times daily with a meal.  09/24/17   [provider]  atomoxetine (STRATTERA) 25 MG capsule Take 40 mg by mouth daily. 03/20/23 07/18/23  [provider]  BOTOX 200 units injection Inject 200 Units into the muscle every 3 (three) months. 06/24/22   [provider]  buPROPion (WELLBUTRIN XL) 150 MG 24 hr tablet Take 3 tablets (450 mg total) by mouth daily. 12/11/23   Melony Overly T, PA-C  busPIRone (BUSPAR) 30 MG tablet TAKE 1 TABLET(30 MG) BY MOUTH TWICE DAILY 12/11/23   Melony Overly T, PA-C  Cyanocobalamin (B-12 IJ) Inject 1 Dose as directed once a week.    [provider]  cyclobenzaprine (FLEXERIL) 10 MG tablet Take 1 tablet (10 mg total) by mouth 3 (three) times daily as needed for muscle spasms. Patient taking differently: Take 10 mg by mouth daily as needed for muscle spasms. 11/04/18   Shugart, Mat Carne, PA-C  diazepam (VALIUM) 5 MG tablet Take 1 tablet (5 mg total) by mouth every 12 (twelve) hours as needed for anxiety. Patient not taking: Reported on 12/11/2023 11/29/22   Melony Overly T, PA-C  ENULOSE 10 GM/15ML SOLN Take 20 g by mouth in the morning and at bedtime. 05/09/22   [provider]  fluticasone (FLONASE) 50 MCG/ACT nasal spray Place 1 spray into both nostrils daily as needed for allergies or rhinitis.    [provider]  fluvoxaMINE (LUVOX) 100 MG tablet TAKE 2 TABLETS(200 MG) BY MOUTH AT BEDTIME 12/11/23   Hurst, Teresa T, PA-C   gabapentin (NEURONTIN) 400 MG capsule TAKE 2 CAPSULES BY MOUTH IN THE MORNING, 2 CAPSULES AT  LUNCH, AND 4 CAPSULES AT  BEDTIME 12/11/23   Hurst, Glade Nurse, PA-C  HYDROcodone-acetaminophen (NORCO/VICODIN) 5-325 MG tablet Take 1 tablet by mouth every 4 (four) hours as needed. 04/13/23   Darrick Grinder, PA-C  hydrOXYzine (ATARAX/VISTARIL) 25 MG tablet Take 1 tablet (25 mg total) by mouth 2 (two) times daily. Patient not taking: Reported on 12/11/2023 07/27/19   Mozingo, Thereasa Solo, NP  ipratropium-albuterol (DUONEB) 0.5-2.5 (3) MG/3ML SOLN Inhale 3 mLs into the lungs every 6 (six) hours as needed (shortness of breath or wheezing).    [provider]  levothyroxine (SYNTHROID) 88 MCG tablet Take 88 mcg by mouth daily before breakfast.    [provider]  linaclotide (LINZESS) 290 MCG CAPS capsule Take 290 mcg by mouth daily before breakfast.  [provider]  montelukast (SINGULAIR) 10 MG tablet Take 10 mg by mouth at bedtime.  02/05/16   [provider]  OLANZapine (ZYPREXA) 20 MG tablet Take 1 tablet (20 mg total) by mouth at bedtime. 02/28/23   Cherie Ouch, PA-C  omeprazole (PRILOSEC) 40 MG capsule Take 40 mg by mouth in the morning and at bedtime. 07/29/17   [provider]  oxybutynin (DITROPAN) 5 MG tablet Take 5 mg by mouth 2 (two) times daily. 07/09/22   [provider]  QUEtiapine (SEROQUEL) 100 MG tablet Take 1 tablet (100 mg total) by mouth at bedtime. 12/11/23   Cherie Ouch, PA-C  tamsulosin (FLOMAX) 0.4 MG CAPS capsule Take 1 capsule (0.4 mg total) by mouth daily. 02/10/22   Honor Loh M, PA-C  topiramate (TOPAMAX) 100 MG tablet Take 2 tablets (200 mg total) by mouth 2 (two) times daily. 12/11/23   Melony Overly T, PA-C  traMADol (ULTRAM) 50 MG tablet Take 100 mg by mouth every 6 (six) hours as needed for moderate pain.    [provider]  traZODone (DESYREL) 100 MG tablet TAKE 1 AND 1/2 TABLETS BY MOUTH  AT BEDTIME  AS NEEDED FOR SLEEP 11/28/23   Melony Overly T, PA-C  sucralfate (CARAFATE) 1 g tablet Take by mouth. 07/16/22 07/31/22  [provider]    Family History Family History  Problem Relation Age of Onset   Diabetes Mother    Hypertension Mother    Cancer Mother    Hypertension Father    COPD Father    Deafness Father    Emphysema Father     Social History Social History   Tobacco Use   Smoking status: Never   Smokeless tobacco: Never  Vaping Use   Vaping status: Never Used  Substance Use Topics   Alcohol use: No   Drug use: No     Allergies   Clindamycin/lincomycin, Nsaids, Oxycodone, and Percocet [oxycodone-acetaminophen]   Review of Systems Review of Systems  Gastrointestinal:  Positive for abdominal pain.     Physical Exam Triage Vital Signs ED Triage Vitals  Encounter Vitals Group     BP 12/21/23 0819 109/72     Systolic BP Percentile --      Diastolic BP Percentile --      Pulse Rate 12/21/23 0819 69     Resp 12/21/23 0819 20     Temp 12/21/23 0819 98.1 F (36.7 C)     Temp Source 12/21/23 0819 Oral     SpO2 12/21/23 0819 98 %     Weight 12/21/23 0822 118 lb (53.5 kg)     Height --      Head Circumference --      Peak Flow --      Pain Score 12/21/23 0822 9     Pain Loc --      Pain Education --      Exclude from Growth Chart --    No data found.  Updated Vital Signs BP 109/72 (BP Location: Right Arm)   Pulse 69   Temp 98.1 F (36.7 C) (Oral)   Resp 20   Wt 53.5 kg   SpO2 98%   BMI 20.90 kg/m   Visual Acuity Right Eye Distance:   Left Eye Distance:   Bilateral Distance:    Right Eye Near:   Left Eye Near:    Bilateral Near:     Physical Exam Vitals reviewed.  Constitutional:      General:  She is not in acute distress.    Appearance: She is not ill-appearing, toxic-appearing or diaphoretic.  HENT:     Right Ear: Tympanic membrane and ear canal normal.     Left Ear: Tympanic membrane and ear canal normal.     Nose:  Congestion present.     Mouth/Throat:     Mouth: Mucous membranes are moist.     Comments: No erythema and no asymmetry. Eyes:     Extraocular Movements: Extraocular movements intact.     Conjunctiva/sclera: Conjunctivae normal.     Pupils: Pupils are equal, round, and reactive to light.  Cardiovascular:     Rate and Rhythm: Normal rate and regular rhythm.     Heart sounds: No murmur heard. Pulmonary:     Effort: No respiratory distress.     Breath sounds: No stridor. No wheezing, rhonchi or rales.  Musculoskeletal:     Cervical back: Neck supple.  Lymphadenopathy:     Cervical: No cervical adenopathy.  Skin:    Capillary Refill: Capillary refill takes less than 2 seconds.     Coloration: Skin is not jaundiced or pale.  Neurological:     General: No focal deficit present.     Mental Status: She is alert and oriented to person, place, and time.  Psychiatric:        Behavior: Behavior normal.      UC Treatments / Results  Labs (all labs ordered are listed, but only abnormal results are displayed) Labs Reviewed  POCT INFLUENZA A/B - Normal    EKG   Radiology No results found.  Procedures Procedures (including critical care time)  Medications Ordered in UC Medications - No data to display  Initial Impression / Assessment and Plan / UC Course  I have reviewed the triage vital signs and the nursing notes.  Pertinent labs & imaging results that were available during my care of the patient were reviewed by me and considered in my medical decision making (see chart for details).     Flu test is negative.  We are currently out of any COVID testing kit.  She can do a home COVID test that she purchases at the pharmacy.  If it is positive she can call back today and I would send in Paxlovid.  Her last renal function was greater than 60.  Zofran sent in for the nausea. Final Clinical Impressions(s) / UC Diagnoses   Final diagnoses:  Viral upper respiratory illness      Discharge Instructions       the flu test was negative.  Ondansetron dissolved in the mouth every 8 hours as needed for nausea or vomiting. Clear liquids(water, gatorade/pedialyte, ginger ale/sprite, chicken broth/soup) and bland things(crackers/toast, rice, potato, bananas) to eat. Avoid acidic foods like lemon/lime/orange/tomato, and avoid greasy/spicy foods.  We are currently out of our test kits for COVID.  If you would like to purchase a home COVID test at the pharmacy and completed at home, that would be reasonable.  If it is positive please call back to this clinic and I would be happy to send in the antiviral medicine treatment for you.  If you worsen, with more intense pain, or you are vomiting and cannot keep fluids down, then please go to the emergency room.     ED Prescriptions     Medication Sig Dispense Auth. Provider   ondansetron (ZOFRAN-ODT) 4 MG disintegrating tablet Take 1 tablet (4 mg total) by mouth every 8 (eight) hours as needed for  nausea or vomiting. 10 tablet Marlinda Mike Janace Aris, MD      PDMP not reviewed this encounter.   Zenia Resides, MD 12/21/23 [phone]

## 2023-12-21 NOTE — Discharge Instructions (Addendum)
the flu test was negative.  Ondansetron dissolved in the mouth every 8 hours as needed for nausea or vomiting. Clear liquids(water, gatorade/pedialyte, ginger ale/sprite, chicken broth/soup) and bland things(crackers/toast, rice, potato, bananas) to eat. Avoid acidic foods like lemon/lime/orange/tomato, and avoid greasy/spicy foods.  We are currently out of our test kits for COVID.  If you would like to purchase a home COVID test at the pharmacy and completed at home, that would be reasonable.  If it is positive please call back to this clinic and I would be happy to send in the antiviral medicine treatment for you.  If you worsen, with more intense pain, or you are vomiting and cannot keep fluids down, then please go to the emergency room.

## 2023-12-22 ENCOUNTER — Telehealth: Payer: Self-pay | Admitting: Physician Assistant

## 2023-12-22 NOTE — Telephone Encounter (Signed)
Pharmacy updated.  I called and spoke to the pharmacy. They just needed to confirm the dose LV 1/23 Continue gabapentin 400 mg, 2 p.o. every morning, 2 p.o. at lunch, and 4 p.o. nightly.  He said he was going to put an order in for the Gab 400 mg bc they don't get it often. Will call when ready.  PT notified.

## 2023-12-22 NOTE — Telephone Encounter (Signed)
Pt called and said due to her insurance she has to use [facility] on [address] in [place]. She had all of scripts transferred there but we need to update her chart. Also the gabapentin will need a PA

## 2023-12-24 ENCOUNTER — Encounter: Payer: Self-pay | Admitting: Oncology

## 2024-01-21 DIAGNOSIS — Z0000 Encounter for general adult medical examination without abnormal findings: Secondary | ICD-10-CM | POA: Diagnosis not present

## 2024-01-21 DIAGNOSIS — R109 Unspecified abdominal pain: Secondary | ICD-10-CM | POA: Diagnosis not present

## 2024-01-21 DIAGNOSIS — E538 Deficiency of other specified B group vitamins: Secondary | ICD-10-CM | POA: Diagnosis not present

## 2024-01-21 DIAGNOSIS — I878 Other specified disorders of veins: Secondary | ICD-10-CM | POA: Diagnosis not present

## 2024-01-21 DIAGNOSIS — E039 Hypothyroidism, unspecified: Secondary | ICD-10-CM | POA: Diagnosis not present

## 2024-01-21 DIAGNOSIS — R42 Dizziness and giddiness: Secondary | ICD-10-CM | POA: Diagnosis not present

## 2024-01-26 DIAGNOSIS — E538 Deficiency of other specified B group vitamins: Secondary | ICD-10-CM | POA: Diagnosis not present

## 2024-02-13 DIAGNOSIS — E538 Deficiency of other specified B group vitamins: Secondary | ICD-10-CM | POA: Diagnosis not present

## 2024-02-20 DIAGNOSIS — G43719 Chronic migraine without aura, intractable, without status migrainosus: Secondary | ICD-10-CM | POA: Diagnosis not present

## 2024-02-24 DIAGNOSIS — R197 Diarrhea, unspecified: Secondary | ICD-10-CM | POA: Diagnosis not present

## 2024-02-24 DIAGNOSIS — J029 Acute pharyngitis, unspecified: Secondary | ICD-10-CM | POA: Diagnosis not present

## 2024-02-24 DIAGNOSIS — R0981 Nasal congestion: Secondary | ICD-10-CM | POA: Diagnosis not present

## 2024-02-24 DIAGNOSIS — H60502 Unspecified acute noninfective otitis externa, left ear: Secondary | ICD-10-CM | POA: Diagnosis not present

## 2024-03-10 ENCOUNTER — Encounter: Payer: Self-pay | Admitting: Physician Assistant

## 2024-03-10 ENCOUNTER — Ambulatory Visit (INDEPENDENT_AMBULATORY_CARE_PROVIDER_SITE_OTHER): Payer: Medicare HMO | Admitting: Physician Assistant

## 2024-03-10 DIAGNOSIS — F4310 Post-traumatic stress disorder, unspecified: Secondary | ICD-10-CM | POA: Diagnosis not present

## 2024-03-10 DIAGNOSIS — F411 Generalized anxiety disorder: Secondary | ICD-10-CM | POA: Diagnosis not present

## 2024-03-10 DIAGNOSIS — G4700 Insomnia, unspecified: Secondary | ICD-10-CM

## 2024-03-10 DIAGNOSIS — F319 Bipolar disorder, unspecified: Secondary | ICD-10-CM

## 2024-03-10 MED ORDER — QUETIAPINE FUMARATE 50 MG PO TABS: 50.0000 mg | ORAL_TABLET | Freq: Every day | ORAL | 3 refills | Status: AC

## 2024-03-10 MED ORDER — OLANZAPINE 20 MG PO TABS: 20.0000 mg | ORAL_TABLET | Freq: Every day | ORAL | 3 refills | Status: AC

## 2024-03-10 NOTE — Progress Notes (Signed)
 [facility]  Patient ID: Morgan [name],  MRN: [id number]  PCP: Spero Dye, PA-C  Date of Evaluation: 03/10/2024  Time spent:30 minutes  Chief Complaint:  Chief Complaint   Anxiety; Depression; Insomnia; Follow-up    HISTORY/CURRENT STATUS: HPI For routine med check.  Anxiety and depression are controlled, 'as well as they can be.'  Feels like her medications are effective.  Energy and motivation are fair to good, depending on the day.  No extreme sadness, tearfulness, or feelings of hopelessness.  Sleeps ok.  ADLs and personal hygiene are normal.   Denies any changes in concentration, making decisions, or remembering things.  Appetite has not changed.  Weight is stable.  She's seeing a personal trainer to help her know how to eat better.  Her PCP doesn't want her to lose anymore weight.  Denies suicidal or homicidal thoughts.  She's passed out several times.  5-6 times since Christmas. No injury. PCP is doing a thorough workup.   She asks about changing the Seroquel  back to 50 mg, we increased to 100 mg last July b/c she was taking that dose anyway.  But she doesn't always need that much and hasn't been able to cut the pills in half.   Patient denies increased energy with decreased need for sleep, increased talkativeness, racing thoughts, impulsivity or risky behaviors, increased spending, increased libido, grandiosity, increased irritability or anger, paranoia, or hallucinations.  Denies dizziness, syncope, seizures, numbness, tingling, tremor, tics, unsteady gait, slurred speech, confusion. Denies muscle or joint pain, stiffness, or dystonia. Denies unexplained weight loss, frequent infections, or sores that heal slowly.  No polyphagia, polydipsia, or polyuria. Denies visual changes or paresthesias.   Individual Medical History/ Review of Systems: Changes? :No      Past medications for mental health diagnoses include: Zoloft , trazodone , gabapentin ,  Risperdal, hydroxyzine , prazosin , nortriptyline, Adderall, Rexulti, Topamax , Latuda, Ingrezza  lithium , Valium , clozapine, BuSpar , Zyprexa   Allergies: Clindamycin/lincomycin, Nsaids, Oxycodone, and Percocet [oxycodone-acetaminophen ]  Current Medications:  Current Outpatient Medications:    AIMOVIG 70 MG/ML SOAJ, Inject 70 mg into the muscle every 30 (thirty) days., Disp: , Rfl: 11   alum & mag hydroxide-simeth (MAALOX MAX) 400-400-40 MG/5ML suspension, Take 15 mLs by mouth every 6 (six) hours as needed for indigestion., Disp: 355 mL, Rfl: 0   AMITIZA  24 MCG capsule, Take 24 mcg by mouth 2 (two) times daily with a meal. , Disp: , Rfl:    BOTOX 200 units injection, Inject 200 Units into the muscle every 3 (three) months., Disp: , Rfl:    buPROPion  (WELLBUTRIN  XL) 150 MG 24 hr tablet, Take 3 tablets (450 mg total) by mouth daily., Disp: 270 tablet, Rfl: 1   busPIRone  (BUSPAR ) 30 MG tablet, TAKE 1 TABLET(30 MG) BY MOUTH TWICE DAILY, Disp: 180 tablet, Rfl: 1   Cyanocobalamin (B-12 IJ), Inject 1 Dose as directed once a week., Disp: , Rfl:    cyclobenzaprine  (FLEXERIL ) 10 MG tablet, Take 1 tablet (10 mg total) by mouth 3 (three) times daily as needed for muscle spasms. (Patient taking differently: Take 10 mg by mouth daily as needed for muscle spasms.), Disp: 270 tablet, Rfl: 0   fluticasone (FLONASE) 50 MCG/ACT nasal spray, Place 1 spray into both nostrils daily as needed for allergies or rhinitis., Disp: , Rfl:    fluvoxaMINE  (LUVOX ) 100 MG tablet, TAKE 2 TABLETS(200 MG) BY MOUTH AT BEDTIME, Disp: 180 tablet, Rfl: 1   gabapentin  (NEURONTIN ) 400 MG capsule, TAKE 2 CAPSULES BY MOUTH IN THE MORNING,  2 CAPSULES AT  LUNCH, AND 4 CAPSULES AT  BEDTIME, Disp: 720 capsule, Rfl: 1   ipratropium-albuterol (DUONEB) 0.5-2.5 (3) MG/3ML SOLN, Inhale 3 mLs into the lungs every 6 (six) hours as needed (shortness of breath or wheezing)., Disp: , Rfl:    levothyroxine  (SYNTHROID ) 88 MCG tablet, Take 88 mcg by mouth daily  before breakfast., Disp: , Rfl:    linaclotide  (LINZESS ) 290 MCG CAPS capsule, Take 290 mcg by mouth daily before breakfast., Disp: , Rfl:    montelukast  (SINGULAIR ) 10 MG tablet, Take 10 mg by mouth at bedtime. , Disp: , Rfl:    omeprazole (PRILOSEC) 40 MG capsule, Take 40 mg by mouth in the morning and at bedtime., Disp: , Rfl:    ondansetron  (ZOFRAN -ODT) 4 MG disintegrating tablet, Take 1 tablet (4 mg total) by mouth every 8 (eight) hours as needed for nausea or vomiting., Disp: 10 tablet, Rfl: 0   oxybutynin (DITROPAN) 5 MG tablet, Take 5 mg by mouth 2 (two) times daily., Disp: , Rfl:    QUEtiapine  (SEROQUEL ) 50 MG tablet, Take 1-2 tablets (50-100 mg total) by mouth at bedtime., Disp: 180 tablet, Rfl: 3   tamsulosin  (FLOMAX ) 0.4 MG CAPS capsule, Take 1 capsule (0.4 mg total) by mouth daily., Disp: 30 capsule, Rfl: 0   topiramate  (TOPAMAX ) 100 MG tablet, Take 2 tablets (200 mg total) by mouth 2 (two) times daily., Disp: 360 tablet, Rfl: 1   traMADol  (ULTRAM ) 50 MG tablet, Take 100 mg by mouth every 6 (six) hours as needed for moderate pain., Disp: , Rfl:    traZODone  (DESYREL ) 100 MG tablet, TAKE 1 AND 1/2 TABLETS BY MOUTH  AT BEDTIME AS NEEDED FOR SLEEP, Disp: 135 tablet, Rfl: 3   atomoxetine (STRATTERA) 25 MG capsule, Take 40 mg by mouth daily., Disp: , Rfl:    diazepam  (VALIUM ) 5 MG tablet, Take 1 tablet (5 mg total) by mouth every 12 (twelve) hours as needed for anxiety. (Patient not taking: Reported on 04/09/2023), Disp: 60 tablet, Rfl: 2   ENULOSE 10 GM/15ML SOLN, Take 20 g by mouth in the morning and at bedtime., Disp: , Rfl:    HYDROcodone -acetaminophen  (NORCO/VICODIN) 5-325 MG tablet, Take 1 tablet by mouth every 4 (four) hours as needed. (Patient not taking: Reported on 03/10/2024), Disp: 15 tablet, Rfl: 0   hydrOXYzine  (ATARAX /VISTARIL ) 25 MG tablet, Take 1 tablet (25 mg total) by mouth 2 (two) times daily. (Patient not taking: Reported on 12/11/2023), Disp: 180 tablet, Rfl: 1   OLANZapine   (ZYPREXA ) 20 MG tablet, Take 1 tablet (20 mg total) by mouth at bedtime., Disp: 90 tablet, Rfl: 3 Medication Side Effects: none  Family Medical/ Social History: Changes?  No  MENTAL HEALTH EXAM:  There were no vitals taken for this visit.There is no height or weight on file to calculate BMI.    General Appearance: Casual and Well Groomed  Eye Contact:  Good  Speech:  Clear and Coherent and Normal Rate  Volume:  Normal  Mood:  Euthymic  Affect:  Congruent  Thought Process:  Goal Directed and Descriptions of Associations: Intact  Orientation:  Full (Time, Place, and Person)  Thought Content: Logical   Suicidal Thoughts:  No  Homicidal Thoughts:  No  Memory:  WNL  Judgement:  Good  Insight:  Good  Psychomotor Activity:  Normal  Concentration:  Concentration: Good and Attention Span: Good  Recall:  Good  Fund of Knowledge: Good  Language: Good  Assets:  Communication Skills Desire for Improvement  Financial Resources/Insurance Housing Leisure Time Resilience  ADL's:  Intact  Cognition: WNL  Prognosis:  Good   PCP follows labs, reviewed 01/21/2024.  DIAGNOSES:    ICD-10-CM   1. Bipolar I disorder (HCC)  F31.9     2. Generalized anxiety disorder  F41.1     3. Insomnia, unspecified type  G47.00     4. PTSD (post-traumatic stress disorder)  F43.10       Receiving Psychotherapy: Yes   Jonette Nestle  RECOMMENDATIONS:  PDMP was reviewed.  Valium  filled 01/20/2023.  Gabapentin  filled 02/09/2024. I provided 30 minutes of face to face time during this encounter, including time spent before and after the visit in records review, medical decision making, counseling pertinent to today's visit, and charting.   Will change Seroquel  back to 50 mg pills.  No other changes will be made.  Continue Strattera 40 mg daily per PCP.   Continue Wellbutrin  XL 450 mg p.o. every morning. Continue BuSpar  30 mg, 1 p.o. twice daily. Continue Valium  5 mg, 1 p.o. twice daily as needed.  Rarely  takes.  Continue Luvox  100 mg, 2 p.o. nightly. Continue gabapentin  400 mg, 2 p.o. every morning, 2 p.o. at lunch, and 4 p.o. nightly.  Continue hydroxyzine  25 mg, 1 p.o. twice daily as needed. Continue Zyprexa  20 mg, 1 p.o. nightly. Change Seroquel  to 50 mg, 1-2 p.o. nightly.  (For sleep) Continue Topamax  100 mg, 2 po bid.  Continue trazodone  100 mg, 1.5 pills nightly as needed. Continue therapy. Return in 3 months.   Marvia Slocumb, PA-C

## 2024-03-12 ENCOUNTER — Telehealth: Payer: Self-pay

## 2024-03-12 NOTE — Telephone Encounter (Signed)
 Prior Authorization submitted and approved for Bupropion  XL 150 mg #[id number] for 90 day effective 02/17/24-11/17/24 with [employer]

## 2024-03-19 ENCOUNTER — Other Ambulatory Visit: Payer: Self-pay | Admitting: Physician Assistant

## 2024-03-30 DIAGNOSIS — J029 Acute pharyngitis, unspecified: Secondary | ICD-10-CM | POA: Diagnosis not present

## 2024-03-30 DIAGNOSIS — E538 Deficiency of other specified B group vitamins: Secondary | ICD-10-CM | POA: Diagnosis not present

## 2024-03-30 DIAGNOSIS — H9202 Otalgia, left ear: Secondary | ICD-10-CM | POA: Diagnosis not present

## 2024-04-19 DIAGNOSIS — H9202 Otalgia, left ear: Secondary | ICD-10-CM | POA: Diagnosis not present

## 2024-04-28 ENCOUNTER — Other Ambulatory Visit: Payer: Self-pay

## 2024-04-28 ENCOUNTER — Encounter (HOSPITAL_BASED_OUTPATIENT_CLINIC_OR_DEPARTMENT_OTHER): Payer: Self-pay

## 2024-04-28 ENCOUNTER — Emergency Department (HOSPITAL_BASED_OUTPATIENT_CLINIC_OR_DEPARTMENT_OTHER)

## 2024-04-28 ENCOUNTER — Ambulatory Visit (HOSPITAL_BASED_OUTPATIENT_CLINIC_OR_DEPARTMENT_OTHER)
Admission: EM | Admit: 2024-04-28 | Discharge: 2024-04-28 | Disposition: A | Discharge status: Home / Self Care | Source: Home / Self Care

## 2024-04-28 ENCOUNTER — Emergency Department (HOSPITAL_BASED_OUTPATIENT_CLINIC_OR_DEPARTMENT_OTHER)
Admission: EM | Admit: 2024-04-28 | Discharge: 2024-04-28 | Disposition: A | Discharge status: Home / Self Care | Attending: Emergency Medicine | Admitting: Emergency Medicine

## 2024-04-28 DIAGNOSIS — R1031 Right lower quadrant pain: Secondary | ICD-10-CM | POA: Diagnosis not present

## 2024-04-28 DIAGNOSIS — N281 Cyst of kidney, acquired: Secondary | ICD-10-CM | POA: Diagnosis not present

## 2024-04-28 DIAGNOSIS — E039 Hypothyroidism, unspecified: Secondary | ICD-10-CM | POA: Insufficient documentation

## 2024-04-28 DIAGNOSIS — Z90710 Acquired absence of both cervix and uterus: Secondary | ICD-10-CM | POA: Diagnosis not present

## 2024-04-28 LAB — CBC WITH DIFFERENTIAL/PLATELET
Abs Immature Granulocytes: 0.02 10*3/uL (ref 0.00–0.07)
Basophils Absolute: 0.1 10*3/uL (ref 0.0–0.1)
Basophils Relative: 2 %
Eosinophils Absolute: 0.3 10*3/uL (ref 0.0–0.5)
Eosinophils Relative: 7 %
HCT: 40.5 % (ref 36.0–46.0)
Hemoglobin: 13.4 g/dL (ref 12.0–15.0)
Immature Granulocytes: 0 %
Lymphocytes Relative: 36 %
Lymphs Abs: 1.6 10*3/uL (ref 0.7–4.0)
MCH: 28.8 pg (ref 26.0–34.0)
MCHC: 33.1 g/dL (ref 30.0–36.0)
MCV: 86.9 fL (ref 80.0–100.0)
Monocytes Absolute: 0.3 10*3/uL (ref 0.1–1.0)
Monocytes Relative: 7 %
Neutro Abs: 2.3 10*3/uL (ref 1.7–7.7)
Neutrophils Relative %: 48 %
Platelets: 182 10*3/uL (ref 150–400)
RBC: 4.66 MIL/uL (ref 3.87–5.11)
RDW: 12.6 % (ref 11.5–15.5)
WBC: 4.6 10*3/uL (ref 4.0–10.5)
nRBC: 0 % (ref 0.0–0.2)

## 2024-04-28 LAB — POCT URINALYSIS DIP (MANUAL ENTRY)
Bilirubin, UA: NEGATIVE
Blood, UA: NEGATIVE
Glucose, UA: NEGATIVE mg/dL
Ketones, POC UA: NEGATIVE mg/dL
Nitrite, UA: NEGATIVE
Spec Grav, UA: >=1.03 — AB (ref 1.010–1.025)
Urobilinogen, UA: 2 U/dL — AB
pH, UA: 6 (ref 5.0–8.0)

## 2024-04-28 LAB — URINALYSIS, W/ REFLEX TO CULTURE (INFECTION SUSPECTED)
Bilirubin Urine: NEGATIVE
Glucose, UA: NEGATIVE mg/dL
Hgb urine dipstick: NEGATIVE
Ketones, ur: NEGATIVE mg/dL
Nitrite: NEGATIVE
Protein, ur: NEGATIVE mg/dL
RBC / HPF: NONE SEEN RBC/hpf (ref 0–5)
Specific Gravity, Urine: 1.02 (ref 1.005–1.030)
pH: 7.5 (ref 5.0–8.0)

## 2024-04-28 LAB — COMPREHENSIVE METABOLIC PANEL WITH GFR
ALT: 15 U/L (ref 0–44)
AST: 20 U/L (ref 15–41)
Albumin: 4.3 g/dL (ref 3.5–5.0)
Alkaline Phosphatase: 95 U/L (ref 38–126)
Anion gap: 9 (ref 5–15)
BUN: 18 mg/dL (ref 6–20)
CO2: 25 mmol/L (ref 22–32)
Calcium: 8.8 mg/dL — ABNORMAL LOW (ref 8.9–10.3)
Chloride: 107 mmol/L (ref 98–111)
Creatinine, Ser: 0.83 mg/dL (ref 0.44–1.00)
GFR, Estimated: >60 mL/min (ref >60)
Glucose, Bld: 98 mg/dL (ref 70–99)
Potassium: 3.9 mmol/L (ref 3.5–5.1)
Sodium: 141 mmol/L (ref 135–145)
Total Bilirubin: 0.4 mg/dL (ref 0.0–1.2)
Total Protein: 6.2 g/dL — ABNORMAL LOW (ref 6.5–8.1)

## 2024-04-28 LAB — LIPASE, BLOOD: Lipase: 37 U/L (ref 11–51)

## 2024-04-28 MED ORDER — DICYCLOMINE HCL 20 MG PO TABS: 20.0000 mg | ORAL_TABLET | Freq: Two times a day (BID) | ORAL | 0 refills | Status: AC

## 2024-04-28 MED ORDER — HYDROMORPHONE HCL 1 MG/ML IJ SOLN: 0.5000 mg | Freq: Once | INTRAMUSCULAR | Status: DC

## 2024-04-28 MED ORDER — LACTATED RINGERS IV BOLUS
1000.0000 mL | Freq: Once | INTRAVENOUS | Status: AC
  Administered 2024-04-28: 1000 mL via INTRAVENOUS

## 2024-04-28 MED ORDER — IOHEXOL 300 MG/ML  SOLN
100.0000 mL | Freq: Once | INTRAMUSCULAR | Status: AC | PRN
  Administered 2024-04-28: 100 mL via INTRAVENOUS

## 2024-04-28 MED ORDER — HYDROMORPHONE HCL 1 MG/ML IJ SOLN
1.0000 mg | Freq: Once | INTRAMUSCULAR | Status: AC
  Administered 2024-04-28: 1 mg via INTRAVENOUS
  Filled 2024-04-28: qty 1

## 2024-04-28 MED ORDER — MORPHINE SULFATE (PF) 4 MG/ML IV SOLN
4.0000 mg | Freq: Once | INTRAVENOUS | Status: AC
  Administered 2024-04-28: 4 mg via INTRAVENOUS
  Filled 2024-04-28: qty 1

## 2024-04-28 MED ORDER — ONDANSETRON HCL 4 MG/2ML IJ SOLN
4.0000 mg | Freq: Once | INTRAMUSCULAR | Status: AC
  Administered 2024-04-28: 4 mg via INTRAVENOUS
  Filled 2024-04-28: qty 2

## 2024-04-28 MED ORDER — ONDANSETRON 4 MG PO TBDP: 4.0000 mg | ORAL_TABLET | Freq: Three times a day (TID) | ORAL | 0 refills | Status: AC | PRN

## 2024-04-28 NOTE — ED Provider Notes (Signed)
 [name]    CSN: [id number] Arrival date & time: 04/28/24  1250      History   Chief Complaint Chief Complaint  Patient presents with   Abdominal Pain    HPI Morgan [name] is a 53 y.o. female.   Patient is a 8 female who presents today with right lower quadrant pain.  This started yesterday.  Became more severe today.  The pain is worse with certain movements and the pain is making her nauseous.  She is rating this pain 10 out of 10.  Reports that she has been having regular bowel movements.  Denies any dysuria, hematuria or urinary frequency.  Does have a past medical history of kidney stones but this feels different.  Total hysterectomy.  No fevers, chills   Abdominal Pain   Past Medical History:  Diagnosis Date   Acid reflux    Anemia    Anxiety    Depression    Family history of adverse reaction to anesthesia    sister has PONV   Fibromyalgia    H. pylori infection    hx of , none now   Headache    migraines, uses imitrex as needed   Hematuria    History of kidney stones    Hypothyroidism    PONV (postoperative nausea and vomiting)    Renal calculi BILATERAL   Right ureteral stone    RLS (restless legs syndrome)    TBI (traumatic brain injury) (HCC) 04/12/2015    Patient Active Problem List   Diagnosis Date Noted   Hypokalemia 08/01/2022    Class: Diagnosis of   Iron deficiency anemia 07/31/2022    Class: Chronic   B12 deficiency anemia 07/26/2022    Class: Chronic   Chronic pain 07/27/2019   Head trauma 01/29/2018   Major depressive disorder, recurrent, 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   PTSD (post-traumatic stress disorder) 03/13/2016   Chronic post-traumatic stress disorder (PTSD) 03/13/2016   Depression    Suicidal ideation    Ureteral calculus 12/26/2014   Ureteral calculus, right 03/31/2013    Past Surgical History:  Procedure Laterality Date   ABDOMINAL HYSTERECTOMY     CHOLECYSTECTOMY      CYSTOSCOPY WITH RETROGRADE PYELOGRAM, URETEROSCOPY AND STENT PLACEMENT Left 12/26/2014   Procedure: CYSTOSCOPY WITH RETROGRADE PYELOGRAM, URETEROSCOPY AND STENT PLACEMENT;  Surgeon: Livingston Rigg, MD;  Location: [facility]  Service: Urology;  Laterality: Left;   CYSTOSCOPY WITH RETROGRADE PYELOGRAM, URETEROSCOPY AND STENT PLACEMENT Left 02/11/2022   Procedure: CYSTOSCOPY WITH RETROGRADE PYELOGRAM, URETEROSCOPY AND STENT PLACEMENT;  Surgeon: Homero Luster, MD;  Location: [facility];  Service: Urology;  Laterality: Left;   CYSTOSCOPY WITH RETROGRADE PYELOGRAM, URETEROSCOPY AND STENT PLACEMENT Right 07/12/2022   Procedure: CYSTOSCOPY WITH RIGHT RETROGRADE PYELOGRAM, URETEROSCOPY HOLMIUM LASER;  Surgeon: Homero Luster, MD;  Location: [facility];  Service: Urology;  Laterality: Right;  60 MINS FOR CASE   CYSTOSCOPY/RETROGRADE/URETEROSCOPY Right 03/31/2013   Procedure: CYSTOSCOPY/RETROGRADE/URETEROSCOPY;  Surgeon: Livingston Rigg, MD;  Location: [facility];  Service: Urology;  Laterality: Right;   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   HOLMIUM LASER APPLICATION Right 03/31/2013   Procedure: HOLMIUM LASER APPLICATION;  Surgeon: Livingston Rigg, MD;  Location: [facility];  Service: Urology;  Laterality: Right;   HOLMIUM LASER APPLICATION Left 12/26/2014   Procedure: HOLMIUM LASER APPLICATION;  Surgeon: Livingston Rigg, MD;  Location: [facility];  Service: Urology;  Laterality: Left;   LAPAROSCOPIC ASSISTED  VAGINAL HYSTERECTOMY  MARCH 2014   W/ BILATERAL SALPINGOOPHORECTOMY   LAPAROSCOPIC GASTRIC BANDING  SEPT 2011   TONSILLECTOMY AND ADENOIDECTOMY  AGE 80'S   URETEROLITHOTOMY  NOV 2013    OB History   No obstetric history on file.      Home Medications    Prior to Admission medications   Medication Sig Start Date End Date Taking? Authorizing Provider  AIMOVIG 70 MG/ML SOAJ Inject 70 mg into the muscle every 30 (thirty) days. 10/13/17   [provider]   alum & mag hydroxide-simeth (MAALOX MAX) 400-400-40 MG/5ML suspension Take 15 mLs by mouth every 6 (six) hours as needed for indigestion. 07/10/22   Kommor, Alyse July, MD  AMITIZA  24 MCG capsule Take 24 mcg by mouth 2 (two) times daily with a meal.  09/24/17   [provider]  atomoxetine (STRATTERA) 25 MG capsule Take 40 mg by mouth daily. 03/20/23 07/18/23  [provider]  BOTOX 200 units injection Inject 200 Units into the muscle every 3 (three) months. 06/24/22   [provider]  buPROPion  (WELLBUTRIN  XL) 150 MG 24 hr tablet Take 3 tablets (450 mg total) by mouth daily. 12/11/23   Marvia Slocumb T, PA-C  busPIRone  (BUSPAR ) 30 MG tablet TAKE 1 TABLET(30 MG) BY MOUTH TWICE DAILY 12/11/23   Marvia Slocumb T, PA-C  Cyanocobalamin (B-12 IJ) Inject 1 Dose as directed once a week.    [provider]  cyclobenzaprine  (FLEXERIL ) 10 MG tablet Take 1 tablet (10 mg total) by mouth 3 (three) times daily as needed for muscle spasms. Patient taking differently: Take 10 mg by mouth daily as needed for muscle spasms. 11/04/18   Shugart, Becky Bowels, PA-C  diazepam  (VALIUM ) 5 MG tablet Take 1 tablet (5 mg total) by mouth every 12 (twelve) hours as needed for anxiety. Patient not taking: Reported on 04/09/2023 11/29/22   Marvia Slocumb T, PA-C  ENULOSE 10 GM/15ML SOLN Take 20 g by mouth in the morning and at bedtime. 05/09/22   [provider]  fluticasone (FLONASE) 50 MCG/ACT nasal spray Place 1 spray into both nostrils daily as needed for allergies or rhinitis.    [provider]  fluvoxaMINE  (LUVOX ) 100 MG tablet TAKE 2 TABLETS(200 MG) BY MOUTH AT BEDTIME 12/11/23   Hurst, Teresa T, PA-C  gabapentin  (NEURONTIN ) 400 MG capsule TAKE 2 CAPSULES BY MOUTH IN THE MORNING, 2 CAPSULES AT  LUNCH, AND 4 CAPSULES AT  BEDTIME 12/11/23   Hurst, Ammon Bales T, PA-C  HYDROcodone -acetaminophen  (NORCO/VICODIN) 5-325 MG tablet Take 1 tablet by mouth every 4 (four) hours as needed. Patient not taking:  Reported on 03/10/2024 04/13/23   Elisa Guest, PA-C  hydrOXYzine  (ATARAX /VISTARIL ) 25 MG tablet Take 1 tablet (25 mg total) by mouth 2 (two) times daily. Patient not taking: Reported on 12/11/2023 07/27/19   Mozingo, Regina Nattalie, NP  ipratropium-albuterol (DUONEB) 0.5-2.5 (3) MG/3ML SOLN Inhale 3 mLs into the lungs every 6 (six) hours as needed (shortness of breath or wheezing).    [provider]  levothyroxine  (SYNTHROID ) 88 MCG tablet Take 88 mcg by mouth daily before breakfast.    [provider]  linaclotide  (LINZESS ) 290 MCG CAPS capsule Take 290 mcg by mouth daily before breakfast.    [provider]  montelukast  (SINGULAIR ) 10 MG tablet Take 10 mg by mouth at bedtime.  02/05/16   [provider]  OLANZapine  (ZYPREXA ) 20 MG tablet Take 1 tablet (20 mg total) by mouth at bedtime. 03/10/24   Marvia Slocumb  T, PA-C  omeprazole (PRILOSEC) 40 MG capsule Take 40 mg by mouth in the morning and at bedtime. 07/29/17   [provider]  ondansetron  (ZOFRAN -ODT) 4 MG disintegrating tablet Take 1 tablet (4 mg total) by mouth every 8 (eight) hours as needed for nausea or vomiting. 12/21/23   Banister, Pamela K, MD  oxybutynin (DITROPAN) 5 MG tablet Take 5 mg by mouth 2 (two) times daily. 07/09/22   [provider]  QUEtiapine  (SEROQUEL ) 50 MG tablet Take 1-2 tablets (50-100 mg total) by mouth at bedtime. 03/10/24   Marvia Slocumb T, PA-C  tamsulosin  (FLOMAX ) 0.4 MG CAPS capsule Take 1 capsule (0.4 mg total) by mouth daily. 02/10/22   Angelyn Kennel M, PA-C  topiramate  (TOPAMAX ) 100 MG tablet Take 2 tablets (200 mg total) by mouth 2 (two) times daily. 12/11/23   Verneda Golder, PA-C  traMADol  (ULTRAM ) 50 MG tablet Take 100 mg by mouth every 6 (six) hours as needed for moderate pain.    [provider]  traZODone  (DESYREL ) 100 MG tablet TAKE 1 AND 1/2 TABLETS BY MOUTH  AT BEDTIME AS NEEDED FOR SLEEP 11/28/23   Marvia Slocumb T, PA-C  sucralfate  (CARAFATE) 1 g tablet Take by mouth. 07/16/22 07/31/22  [provider]    Family History Family History  Problem Relation Age of Onset   Diabetes Mother    Hypertension Mother    Cancer Mother    Hypertension Father    COPD Father    Deafness Father    Emphysema Father     Social History Social History   Tobacco Use   Smoking status: Never   Smokeless tobacco: Never  Vaping Use   Vaping status: Never Used  Substance Use Topics   Alcohol use: No   Drug use: No     Allergies   Clindamycin/lincomycin, Nsaids, Oxycodone, and Percocet [oxycodone-acetaminophen ]   Review of Systems Review of Systems  Gastrointestinal:  Positive for abdominal pain.   See HPI  Physical Exam Triage Vital Signs ED Triage Vitals  Encounter Vitals Group     BP 04/28/24 1330 110/70     Systolic BP Percentile --      Diastolic BP Percentile --      Pulse Rate 04/28/24 1330 83     Resp 04/28/24 1330 20     Temp 04/28/24 1330 98.3 F (36.8 C)     Temp Source 04/28/24 1330 Oral     SpO2 04/28/24 1330 98 %     Weight --      Height --      Head Circumference --      Peak Flow --      Pain Score 04/28/24 1332 10     Pain Loc --      Pain Education --      Exclude from Growth Chart --    No data found.  Updated Vital Signs BP 110/70 (BP Location: Right Arm)   Pulse 83   Temp 98.3 F (36.8 C) (Oral)   Resp 20   SpO2 98%   Visual Acuity Right Eye Distance:   Left Eye Distance:   Bilateral Distance:    Right Eye Near:   Left Eye Near:    Bilateral Near:     Physical Exam Vitals and nursing note reviewed.  Constitutional:      General: She is not in acute distress.    Appearance: Normal appearance. She is not ill-appearing, toxic-appearing or diaphoretic.  Pulmonary:  Effort: Pulmonary effort is normal.  Abdominal:     General: Abdomen is flat.     Palpations: Abdomen is soft.     Tenderness: There is abdominal tenderness in the right lower quadrant. There  is guarding and rebound.  Neurological:     Mental Status: She is alert.  Psychiatric:        Mood and Affect: Mood normal.      UC Treatments / Results  Labs (all labs ordered are listed, but only abnormal results are displayed) Labs Reviewed  POCT URINALYSIS DIP (MANUAL ENTRY) - Abnormal; Notable for the following components:      Result Value   Clarity, UA hazy (*)    Spec Grav, UA >=1.030 (*)    Protein Ur, POC trace (*)    Urobilinogen, UA 2.0 (*)    Leukocytes, UA Trace (*)    All other components within normal limits  URINE CULTURE    EKG   Radiology No results found.  Procedures Procedures (including critical care time)  Medications Ordered in UC Medications - No data to display  Initial Impression / Assessment and Plan / UC Course  I have reviewed the triage vital signs and the nursing notes.  Pertinent labs & imaging results that were available during my care of the patient were reviewed by me and considered in my medical decision making (see chart for details).     RLQ pain-patient presenting with worsening right lower quadrant pain over the last 24 hours.  Urinalysis not specific for UTI.  We will culture it.  History of full hysterectomy  Exam concerning for appendicitis.  Recommendations are to go to the ER for CT scan and further evaluation and management of this. Final Clinical Impressions(s) / UC Diagnoses   Final diagnoses:  Right lower quadrant abdominal pain   Discharge Instructions      Please go to the ER for a CT scan for potential appendicitis   ED Prescriptions   None    PDMP not reviewed this encounter.   Landa Pine, FNP 04/28/24 1428

## 2024-04-28 NOTE — ED Triage Notes (Signed)
 Complaining of pain in the right lower abdomen that started yesterday and is getting worse today. Worried that it could be appendicitis.

## 2024-04-28 NOTE — ED Notes (Signed)
 Patient is being discharged from the Urgent Care and sent to the Emergency Department via POV . Per T. Bast, FNP, patient is in need of higher level of care due to RLQ pain. Patient is aware and verbalizes understanding of plan of care.  Vitals:   04/28/24 1330  BP: 110/70  Pulse: 83  Resp: 20  Temp: 98.3 F (36.8 C)  SpO2: 98%

## 2024-04-28 NOTE — Discharge Instructions (Signed)
 Please go to the ER for a CT scan for potential appendicitis

## 2024-04-28 NOTE — ED Triage Notes (Signed)
 RLQ pain onset yesterday but becoming severe today. States pain 10/10 at present. Still has appendix. Became very nauseous this morning when attempting to eat and pain became worse. Patient has open bottle of soda at time of assessment. Instructed to refrain from drinking anymore.

## 2024-04-28 NOTE — ED Notes (Signed)
 Patient transported to CT

## 2024-04-28 NOTE — ED Provider Notes (Signed)
 [place] [facility] Provider Note   CSN: [id number] Arrival date & time: 04/28/24  1441     History  Chief Complaint  Patient presents with   Abdominal Pain    Morgan [name] is a 53 y.o. female.  HPI     53 year old female with a history of hypothyroidism, nephrolithiasis, traumatic brain injury, migraines, depression, anxiety, GERD, history of hysterectomy, cholecystectomy, Gastric bypass, who presents with concern for right lower quadrant abdominal pain.  Reports that yesterday she had some right sided abdominal pain, slept and it seemed to feel little bit better.  Today, she had something to eat this morning and the pain came back.  Reports it is now 10 out of 10 pain, sharp and has been constant all day.  She has had nausea.  No vomiting.  She has not had any dysuria or hematuria.  Reports that at her baseline he tries to urinate frequently.  Reports this pain does not feel like her prior kidney stones.  Denies fever, chills, diarrhea or constipation She was seen at the urgent care and sent here for further evaluation.  Past Medical History:  Diagnosis Date   Acid reflux    Anemia    Anxiety    Depression    Family history of adverse reaction to anesthesia    sister has PONV   Fibromyalgia    H. pylori infection    hx of , none now   Headache    migraines, uses imitrex as needed   Hematuria    History of kidney stones    Hypothyroidism    PONV (postoperative nausea and vomiting)    Renal calculi BILATERAL   Right ureteral stone    RLS (restless legs syndrome)    TBI (traumatic brain injury) (HCC) 04/12/2015    Past Surgical History:  Procedure Laterality Date   ABDOMINAL HYSTERECTOMY     CHOLECYSTECTOMY     CYSTOSCOPY WITH RETROGRADE PYELOGRAM, URETEROSCOPY AND STENT PLACEMENT Left 12/26/2014   Procedure: CYSTOSCOPY WITH RETROGRADE PYELOGRAM, URETEROSCOPY AND STENT PLACEMENT;  Surgeon: Livingston Rigg, MD;  Location:  [facility];  Service: Urology;  Laterality: Left;   CYSTOSCOPY WITH RETROGRADE PYELOGRAM, URETEROSCOPY AND STENT PLACEMENT Left 02/11/2022   Procedure: CYSTOSCOPY WITH RETROGRADE PYELOGRAM, URETEROSCOPY AND STENT PLACEMENT;  Surgeon: Homero Luster, MD;  Location: [facility];  Service: Urology;  Laterality: Left;   CYSTOSCOPY WITH RETROGRADE PYELOGRAM, URETEROSCOPY AND STENT PLACEMENT Right 07/12/2022   Procedure: CYSTOSCOPY WITH RIGHT RETROGRADE PYELOGRAM, URETEROSCOPY HOLMIUM LASER;  Surgeon: Homero Luster, MD;  Location: [facility];  Service: Urology;  Laterality: Right;  60 MINS FOR CASE   CYSTOSCOPY/RETROGRADE/URETEROSCOPY Right 03/31/2013   Procedure: CYSTOSCOPY/RETROGRADE/URETEROSCOPY;  Surgeon: Livingston Rigg, MD;  Location: [facility];  Service: Urology;  Laterality: Right;   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   HOLMIUM LASER APPLICATION Right 03/31/2013   Procedure: HOLMIUM LASER APPLICATION;  Surgeon: Livingston Rigg, MD;  Location: [facility];  Service: Urology;  Laterality: Right;   HOLMIUM LASER APPLICATION Left 12/26/2014   Procedure: HOLMIUM LASER APPLICATION;  Surgeon: Livingston Rigg, MD;  Location: [facility];  Service: Urology;  Laterality: Left;   LAPAROSCOPIC ASSISTED VAGINAL HYSTERECTOMY  MARCH 2014   W/ BILATERAL SALPINGOOPHORECTOMY   LAPAROSCOPIC GASTRIC BANDING  SEPT 2011   TONSILLECTOMY AND ADENOIDECTOMY  AGE 15'S   URETEROLITHOTOMY  NOV 2013     Home Medications Prior to Admission medications   Medication  Sig Start Date End Date Taking? Authorizing Provider  dicyclomine  (BENTYL ) 20 MG tablet Take 1 tablet (20 mg total) by mouth 2 (two) times daily. 04/28/24  Yes Scarlette Currier, MD  ondansetron  (ZOFRAN -ODT) 4 MG disintegrating tablet Take 1 tablet (4 mg total) by mouth every 8 (eight) hours as needed for nausea or vomiting. 04/28/24  Yes Scarlette Currier, MD  AIMOVIG 70 MG/ML SOAJ Inject 70 mg into the muscle every 30 (thirty) days.  10/13/17   [provider]  alum & mag hydroxide-simeth (MAALOX MAX) 400-400-40 MG/5ML suspension Take 15 mLs by mouth every 6 (six) hours as needed for indigestion. 07/10/22   Kommor, Alyse July, MD  AMITIZA  24 MCG capsule Take 24 mcg by mouth 2 (two) times daily with a meal.  09/24/17   [provider]  atomoxetine (STRATTERA) 25 MG capsule Take 40 mg by mouth daily. 03/20/23 07/18/23  [provider]  BOTOX 200 units injection Inject 200 Units into the muscle every 3 (three) months. 06/24/22   [provider]  buPROPion  (WELLBUTRIN  XL) 150 MG 24 hr tablet Take 3 tablets (450 mg total) by mouth daily. 12/11/23   Marvia Slocumb T, PA-C  busPIRone  (BUSPAR ) 30 MG tablet TAKE 1 TABLET(30 MG) BY MOUTH TWICE DAILY 12/11/23   Marvia Slocumb T, PA-C  Cyanocobalamin (B-12 IJ) Inject 1 Dose as directed once a week.    [provider]  cyclobenzaprine  (FLEXERIL ) 10 MG tablet Take 1 tablet (10 mg total) by mouth 3 (three) times daily as needed for muscle spasms. Patient taking differently: Take 10 mg by mouth daily as needed for muscle spasms. 11/04/18   Shugart, Becky Bowels, PA-C  diazepam  (VALIUM ) 5 MG tablet Take 1 tablet (5 mg total) by mouth every 12 (twelve) hours as needed for anxiety. Patient not taking: Reported on 04/09/2023 11/29/22   Marvia Slocumb T, PA-C  ENULOSE 10 GM/15ML SOLN Take 20 g by mouth in the morning and at bedtime. 05/09/22   [provider]  fluticasone (FLONASE) 50 MCG/ACT nasal spray Place 1 spray into both nostrils daily as needed for allergies or rhinitis.    [provider]  fluvoxaMINE  (LUVOX ) 100 MG tablet TAKE 2 TABLETS(200 MG) BY MOUTH AT BEDTIME 12/11/23   Hurst, Teresa T, PA-C  gabapentin  (NEURONTIN ) 400 MG capsule TAKE 2 CAPSULES BY MOUTH IN THE MORNING, 2 CAPSULES AT  LUNCH, AND 4 CAPSULES AT  BEDTIME 12/11/23   Hurst, Teresa T, PA-C  HYDROcodone -acetaminophen  (NORCO/VICODIN) 5-325 MG tablet Take 1 tablet by mouth every 4 (four) hours  as needed. Patient not taking: Reported on 03/10/2024 04/13/23   Elisa Guest, PA-C  hydrOXYzine  (ATARAX /VISTARIL ) 25 MG tablet Take 1 tablet (25 mg total) by mouth 2 (two) times daily. Patient not taking: Reported on 12/11/2023 07/27/19   Mozingo, Regina Nattalie, NP  ipratropium-albuterol (DUONEB) 0.5-2.5 (3) MG/3ML SOLN Inhale 3 mLs into the lungs every 6 (six) hours as needed (shortness of breath or wheezing).    [provider]  levothyroxine  (SYNTHROID ) 88 MCG tablet Take 88 mcg by mouth daily before breakfast.    [provider]  linaclotide  (LINZESS ) 290 MCG CAPS capsule Take 290 mcg by mouth daily before breakfast.    [provider]  montelukast  (SINGULAIR ) 10 MG tablet Take 10 mg by mouth at bedtime.  02/05/16   [provider]  OLANZapine  (ZYPREXA ) 20 MG tablet Take 1 tablet (20 mg total) by mouth at bedtime. 03/10/24   Marvia Slocumb T, PA-C  omeprazole (PRILOSEC) 40  MG capsule Take 40 mg by mouth in the morning and at bedtime. 07/29/17   [provider]  oxybutynin (DITROPAN) 5 MG tablet Take 5 mg by mouth 2 (two) times daily. 07/09/22   [provider]  QUEtiapine  (SEROQUEL ) 50 MG tablet Take 1-2 tablets (50-100 mg total) by mouth at bedtime. 03/10/24   Verneda Golder, PA-C  tamsulosin  (FLOMAX ) 0.4 MG CAPS capsule Take 1 capsule (0.4 mg total) by mouth daily. 02/10/22   Angelyn Kennel M, PA-C  topiramate  (TOPAMAX ) 100 MG tablet Take 2 tablets (200 mg total) by mouth 2 (two) times daily. 12/11/23   Marvia Slocumb T, PA-C  traMADol  (ULTRAM ) 50 MG tablet Take 100 mg by mouth every 6 (six) hours as needed for moderate pain.    [provider]  traZODone  (DESYREL ) 100 MG tablet TAKE 1 AND 1/2 TABLETS BY MOUTH  AT BEDTIME AS NEEDED FOR SLEEP 11/28/23   Marvia Slocumb T, PA-C  sucralfate (CARAFATE) 1 g tablet Take by mouth. 07/16/22 07/31/22  [provider]      Allergies    Clindamycin/lincomycin, Nsaids, Oxycodone, and  Percocet [oxycodone-acetaminophen ]    Review of Systems   Review of Systems  Physical Exam Updated Vital Signs BP 107/64   Pulse 79   Temp 97.8 F (36.6 C) (Oral)   Resp 18   Ht 5' 3 (1.6 m)   Wt 52.2 kg   SpO2 100%   BMI 20.37 kg/m  Physical Exam Vitals and nursing note reviewed.  Constitutional:      General: She is not in acute distress.    Appearance: She is well-developed. She is not diaphoretic.  HENT:     Head: Normocephalic and atraumatic.   Eyes:     Conjunctiva/sclera: Conjunctivae normal.    Cardiovascular:     Rate and Rhythm: Normal rate and regular rhythm.     Heart sounds: Normal heart sounds. No murmur heard.    No friction rub. No gallop.  Pulmonary:     Effort: Pulmonary effort is normal. No respiratory distress.     Breath sounds: Normal breath sounds. No wheezing or rales.  Abdominal:     General: There is no distension.     Palpations: Abdomen is soft.     Tenderness: There is abdominal tenderness in the right lower quadrant. There is no guarding.   Musculoskeletal:        General: No tenderness.     Cervical back: Normal range of motion.   Skin:    General: Skin is warm and dry.     Findings: No erythema or rash.   Neurological:     Mental Status: She is alert and oriented to person, place, and time.     ED Results / Procedures / Treatments   Labs (all labs ordered are listed, but only abnormal results are displayed) Labs Reviewed  COMPREHENSIVE METABOLIC PANEL WITH GFR - Abnormal; Notable for the following components:      Result Value   Calcium 8.8 (*)    Total Protein 6.2 (*)    All other components within normal limits  URINALYSIS, W/ REFLEX TO CULTURE (INFECTION SUSPECTED) - Abnormal; Notable for the following components:   Leukocytes,Ua TRACE (*)    Bacteria, UA RARE (*)    All other components within normal limits  CBC WITH DIFFERENTIAL/PLATELET  LIPASE, BLOOD    EKG None  Radiology CT ABDOMEN PELVIS W  CONTRAST Result Date: 04/28/2024 CLINICAL DATA:  Acute right lower quadrant  abdominal pain. EXAM: CT ABDOMEN AND PELVIS WITH CONTRAST TECHNIQUE: Multidetector CT imaging of the abdomen and pelvis was performed using the standard protocol following bolus administration of intravenous contrast. RADIATION DOSE REDUCTION: This exam was performed according to the departmental dose-optimization program which includes automated exposure control, adjustment of the mA and/or kV according to patient size and/or use of iterative reconstruction technique. CONTRAST:  OMNIPAQUE  IOHEXOL  300 MG/ML  SOLN COMPARISON:  Apr 13, 2023. FINDINGS: Lower chest: No acute abnormality. Hepatobiliary: Mild intrahepatic and extrahepatic biliary dilatation is noted most likely due to post cholecystectomy status. No hepatic abnormality is noted. Pancreas: Unremarkable. No pancreatic ductal dilatation or surrounding inflammatory changes. Spleen: Normal in size without focal abnormality. Adrenals/Urinary Tract: Adrenal glands appear normal. Bilateral renal cysts are noted. No hydronephrosis or renal obstruction is noted. Urinary bladder is unremarkable. Stomach/Bowel: Status post gastric bypass. Appendix appears normal. No evidence of bowel obstruction or inflammation. Vascular/Lymphatic: No significant vascular findings are present. No enlarged abdominal or pelvic lymph nodes. Reproductive: Status post hysterectomy. No adnexal masses. Other: No ascites or hernia. Musculoskeletal: No acute or significant osseous findings. IMPRESSION: No acute abnormality in abdomen or pelvis. Electronically Signed   By: Rosalene Colon M.D.   On: 04/28/2024 17:47    Procedures Procedures    Medications Ordered in ED Medications  morphine  (PF) 4 MG/ML injection 4 mg (4 mg Intravenous Given 04/28/24 1519)  ondansetron  (ZOFRAN ) injection 4 mg (4 mg Intravenous Given 04/28/24 1519)  lactated ringers  bolus 1,000 mL (0 mLs Intravenous Stopped 04/28/24 1634)   HYDROmorphone  (DILAUDID ) injection 1 mg (1 mg Intravenous Given 04/28/24 1606)  iohexol  (OMNIPAQUE ) 300 MG/ML solution 100 mL (100 mLs Intravenous Contrast Given 04/28/24 1713)    ED Course/ Medical Decision Making/ A&P                                   53 year old female with a history of hypothyroidism, nephrolithiasis, traumatic brain injury, migraines, depression, anxiety, GERD, history of hysterectomy, cholecystectomy, Gastric bypass, who presents with concern for right lower quadrant abdominal pain.  DDx includes appendicitis, pancreatitis, choledocolithiasis, pyelonephritis, nephrolithiasis, diverticulitis, SBO, hernia.   Labs completed and personally via interpreted by me show no sign of anemia, no leukocytosis, no clinically significant electrolyte abnormalities, no transaminitis, no sign of pancreatitis.  No sign of UTI.  CT abdomen pelvis was completed and personally evaluated interpreted by me and radiology shows no acute findings. Unclear etiology however do not see indications for emergent surgery or admission.  Given rx for bentyl  and zofran .  Patient discharged in stable condition with understanding of reasons to return.          Final Clinical Impression(s) / ED Diagnoses Final diagnoses:  Right lower quadrant abdominal pain    Rx / DC Orders ED Discharge Orders          Ordered    dicyclomine  (BENTYL ) 20 MG tablet  2 times daily        04/28/24 1831    ondansetron  (ZOFRAN -ODT) 4 MG disintegrating tablet  Every 8 hours PRN        04/28/24 1831              Scarlette Currier, MD 04/30/24 [phone]

## 2024-04-28 NOTE — ED Notes (Signed)
 Pt unable to provide urine sample at this time.   Anastacio Balm, RN

## 2024-04-29 LAB — URINE CULTURE: Culture: 10000 — AB

## 2024-04-30 ENCOUNTER — Ambulatory Visit (HOSPITAL_COMMUNITY): Payer: Self-pay

## 2024-05-17 DIAGNOSIS — Z9884 Bariatric surgery status: Secondary | ICD-10-CM | POA: Diagnosis not present

## 2024-05-17 DIAGNOSIS — K648 Other hemorrhoids: Secondary | ICD-10-CM | POA: Diagnosis not present

## 2024-05-17 DIAGNOSIS — K219 Gastro-esophageal reflux disease without esophagitis: Secondary | ICD-10-CM | POA: Diagnosis not present

## 2024-05-17 DIAGNOSIS — Z538 Procedure and treatment not carried out for other reasons: Secondary | ICD-10-CM | POA: Diagnosis not present

## 2024-05-17 DIAGNOSIS — Z1211 Encounter for screening for malignant neoplasm of colon: Secondary | ICD-10-CM | POA: Diagnosis not present

## 2024-05-31 DIAGNOSIS — N201 Calculus of ureter: Secondary | ICD-10-CM | POA: Diagnosis not present

## 2024-06-02 ENCOUNTER — Ambulatory Visit (INDEPENDENT_AMBULATORY_CARE_PROVIDER_SITE_OTHER): Admitting: Physician Assistant

## 2024-06-02 ENCOUNTER — Encounter: Payer: Self-pay | Admitting: Physician Assistant

## 2024-06-02 DIAGNOSIS — F411 Generalized anxiety disorder: Secondary | ICD-10-CM | POA: Diagnosis not present

## 2024-06-02 DIAGNOSIS — G4700 Insomnia, unspecified: Secondary | ICD-10-CM

## 2024-06-02 DIAGNOSIS — F4310 Post-traumatic stress disorder, unspecified: Secondary | ICD-10-CM

## 2024-06-02 DIAGNOSIS — F319 Bipolar disorder, unspecified: Secondary | ICD-10-CM

## 2024-06-02 MED ORDER — BUPROPION HCL ER (XL) 150 MG PO TB24: 450.0000 mg | ORAL_TABLET | Freq: Every day | ORAL | 3 refills | Status: AC

## 2024-06-02 MED ORDER — BUSPIRONE HCL 30 MG PO TABS: ORAL_TABLET | ORAL | 3 refills | Status: AC

## 2024-06-02 MED ORDER — TOPIRAMATE 100 MG PO TABS: 200.0000 mg | ORAL_TABLET | Freq: Two times a day (BID) | ORAL | 3 refills | Status: AC

## 2024-06-02 MED ORDER — FLUVOXAMINE MALEATE 100 MG PO TABS: ORAL_TABLET | ORAL | 3 refills | Status: AC

## 2024-06-02 MED ORDER — GABAPENTIN 400 MG PO CAPS: ORAL_CAPSULE | ORAL | 3 refills | Status: AC

## 2024-06-02 MED ORDER — TRAZODONE HCL 100 MG PO TABS: 150.0000 mg | ORAL_TABLET | Freq: Every evening | ORAL | 3 refills | Status: AC | PRN

## 2024-06-02 NOTE — Progress Notes (Signed)
 [facility]  Patient ID: Morgan [name],  MRN: [id number]  PCP: Hunter Mickey Browner, PA-C  Date of Evaluation: 06/02/2024  Time spent:25 minutes  Chief Complaint:  Chief Complaint   Anxiety; ADD; Depression; Insomnia; Follow-up    HISTORY/CURRENT STATUS: HPI For routine med check.  Doing well as far her mental health meds go.  Energy and motivation are fair to good depending on the day.   No extreme sadness, tearfulness, or feelings of hopelessness.   ADLs and personal hygiene are normal.   Appetite has not changed.  Weight is stable. Anxiety is controlled.  Denies suicidal or homicidal thoughts.  Has trouble sleeping but not worse than nl.  Mind races at night and she can't quit thinking about things. The Trazodone  and Seroquel  do help some.   Patient denies increased energy with decreased need for sleep, increased talkativeness, racing thoughts, impulsivity or risky behaviors, increased spending, grandiosity, increased irritability or anger, paranoia, or hallucinations.  Denies dizziness, syncope, seizures, numbness, tingling, tremor, tics, unsteady gait, slurred speech, confusion. Pain in leg LE from cat bit.   Individual Medical History/ Review of Systems: Changes? :Yes  bit by a cat on her left leg a few days ago, now on atbx and had TD shot.   Past medications for mental health diagnoses include: Zoloft , trazodone , gabapentin , Risperdal, hydroxyzine , prazosin , nortriptyline, Adderall, Rexulti, Topamax , Latuda, Ingrezza  lithium , Valium , clozapine, BuSpar , Zyprexa   Allergies: Clindamycin/lincomycin, Nsaids, Oxycodone, and Percocet [oxycodone-acetaminophen ]  Current Medications:  Current Outpatient Medications:    AIMOVIG 70 MG/ML SOAJ, Inject 70 mg into the muscle every 30 (thirty) days., Disp: , Rfl: 11   alum & mag hydroxide-simeth (MAALOX MAX) 400-400-40 MG/5ML suspension, Take 15 mLs by mouth every 6 (six) hours as needed for indigestion., Disp: 355 mL,  Rfl: 0   AMITIZA  24 MCG capsule, Take 24 mcg by mouth 2 (two) times daily with a meal. , Disp: , Rfl:    amoxicillin-clavulanate (AUGMENTIN) 875-125 MG tablet, Take 1 tablet by mouth., Disp: , Rfl:    atomoxetine (STRATTERA) 25 MG capsule, Take 40 mg by mouth daily., Disp: , Rfl:    BOTOX 200 units injection, Inject 200 Units into the muscle every 3 (three) months., Disp: , Rfl:    Cyanocobalamin (B-12 IJ), Inject 1 Dose as directed once a week., Disp: , Rfl:    cyclobenzaprine  (FLEXERIL ) 10 MG tablet, Take 1 tablet (10 mg total) by mouth 3 (three) times daily as needed for muscle spasms., Disp: 270 tablet, Rfl: 0   dicyclomine  (BENTYL ) 20 MG tablet, Take 1 tablet (20 mg total) by mouth 2 (two) times daily., Disp: 20 tablet, Rfl: 0   ENULOSE 10 GM/15ML SOLN, Take 20 g by mouth in the morning and at bedtime., Disp: , Rfl:    fluticasone (FLONASE) 50 MCG/ACT nasal spray, Place 1 spray into both nostrils daily as needed for allergies or rhinitis., Disp: , Rfl:    ipratropium-albuterol (DUONEB) 0.5-2.5 (3) MG/3ML SOLN, Inhale 3 mLs into the lungs every 6 (six) hours as needed (shortness of breath or wheezing)., Disp: , Rfl:    levothyroxine  (SYNTHROID ) 88 MCG tablet, Take 88 mcg by mouth daily before breakfast., Disp: , Rfl:    linaclotide  (LINZESS ) 290 MCG CAPS capsule, Take 290 mcg by mouth daily before breakfast., Disp: , Rfl:    montelukast  (SINGULAIR ) 10 MG tablet, Take 10 mg by mouth at bedtime. , Disp: , Rfl:    OLANZapine  (ZYPREXA ) 20 MG tablet, Take 1 tablet (20 mg  total) by mouth at bedtime., Disp: 90 tablet, Rfl: 3   omeprazole (PRILOSEC) 40 MG capsule, Take 40 mg by mouth in the morning and at bedtime., Disp: , Rfl:    ondansetron  (ZOFRAN -ODT) 4 MG disintegrating tablet, Take 1 tablet (4 mg total) by mouth every 8 (eight) hours as needed for nausea or vomiting., Disp: 20 tablet, Rfl: 0   oxybutynin (DITROPAN) 5 MG tablet, Take 5 mg by mouth 2 (two) times daily., Disp: , Rfl:    QUEtiapine   (SEROQUEL ) 50 MG tablet, Take 1-2 tablets (50-100 mg total) by mouth at bedtime., Disp: 180 tablet, Rfl: 3   tamsulosin  (FLOMAX ) 0.4 MG CAPS capsule, Take 1 capsule (0.4 mg total) by mouth daily., Disp: 30 capsule, Rfl: 0   traMADol  (ULTRAM ) 50 MG tablet, Take 100 mg by mouth every 6 (six) hours as needed for moderate pain., Disp: , Rfl:    buPROPion  (WELLBUTRIN  XL) 150 MG 24 hr tablet, Take 3 tablets (450 mg total) by mouth daily., Disp: 270 tablet, Rfl: 3   busPIRone  (BUSPAR ) 30 MG tablet, TAKE 1 TABLET(30 MG) BY MOUTH TWICE DAILY, Disp: 180 tablet, Rfl: 3   diazepam  (VALIUM ) 5 MG tablet, Take 1 tablet (5 mg total) by mouth every 12 (twelve) hours as needed for anxiety. (Patient not taking: Reported on 06/02/2024), Disp: 60 tablet, Rfl: 2   fluvoxaMINE  (LUVOX ) 100 MG tablet, TAKE 2 TABLETS(200 MG) BY MOUTH AT BEDTIME, Disp: 180 tablet, Rfl: 3   gabapentin  (NEURONTIN ) 400 MG capsule, TAKE 2 CAPSULES BY MOUTH IN THE MORNING, 2 CAPSULES AT  LUNCH, AND 4 CAPSULES AT  BEDTIME, Disp: 720 capsule, Rfl: 3   HYDROcodone -acetaminophen  (NORCO/VICODIN) 5-325 MG tablet, Take 1 tablet by mouth every 4 (four) hours as needed. (Patient not taking: Reported on 06/02/2024), Disp: 15 tablet, Rfl: 0   hydrOXYzine  (ATARAX /VISTARIL ) 25 MG tablet, Take 1 tablet (25 mg total) by mouth 2 (two) times daily. (Patient not taking: Reported on 06/02/2024), Disp: 180 tablet, Rfl: 1   topiramate  (TOPAMAX ) 100 MG tablet, Take 2 tablets (200 mg total) by mouth 2 (two) times daily., Disp: 360 tablet, Rfl: 3   traZODone  (DESYREL ) 100 MG tablet, Take 1.5 tablets (150 mg total) by mouth at bedtime as needed for sleep., Disp: 135 tablet, Rfl: 3 Medication Side Effects: none  Family Medical/ Social History: Changes?  No  MENTAL HEALTH EXAM:  There were no vitals taken for this visit.There is no height or weight on file to calculate BMI.    General Appearance: Casual and Well Groomed  Eye Contact:  Good  Speech:  Clear and Coherent and  Normal Rate  Volume:  Normal  Mood:  Euthymic  Affect:  Congruent  Thought Process:  Goal Directed and Descriptions of Associations: Intact  Orientation:  Full (Time, Place, and Person)  Thought Content: Logical   Suicidal Thoughts:  No  Homicidal Thoughts:  No  Memory:  WNL  Judgement:  Good  Insight:  Good  Psychomotor Activity:  Normal  Concentration:  Concentration: Good and Attention Span: Good  Recall:  Good  Fund of Knowledge: Good  Language: Good  Assets:  Communication Skills Desire for Improvement Financial Resources/Insurance Housing Leisure Time Resilience Transportation  ADL's:  Intact  Cognition: WNL  Prognosis:  Good   PCP follows labs   DIAGNOSES:    ICD-10-CM   1. Bipolar I disorder (HCC)  F31.9     2. Generalized anxiety disorder  F41.1     3. PTSD (post-traumatic stress disorder)  F43.10     4. Insomnia, unspecified type  G47.00       Receiving Psychotherapy: Yes   Devere Sharps  RECOMMENDATIONS:  PDMP was reviewed.  Valium  filled 01/20/2023.  Gabapentin  filled 05/17/2024. I provided approximately 25 minutes of face to face time during this encounter, including time spent before and after the visit in records review, medical decision making, counseling pertinent to today's visit, and charting.   Stable on current meds so no changes are needed.   Continue Strattera 25 mg daily per PCP.   Continue Wellbutrin  XL 450 mg p.o. every morning. Continue BuSpar  30 mg, 1 p.o. twice daily. Continue Valium  5 mg, 1 p.o. twice daily as needed.  Rarely takes.  Continue Luvox  100 mg, 2 p.o. nightly. Continue gabapentin  400 mg, 2 p.o. every morning, 2 p.o. at lunch, and 4 p.o. nightly.  Continue hydroxyzine  25 mg, 1 p.o. twice daily as needed. Continue Zyprexa  20 mg, 1 p.o. nightly. Continue Seroquel   50 mg, 1-2 p.o. nightly.  (For sleep) Continue Topamax  100 mg, 2 po bid.  Continue trazodone  100 mg, 1.5 pills nightly as needed. Continue therapy. Return in 6  months.   Verneita Cooks, PA-C

## 2024-06-04 DIAGNOSIS — L02416 Cutaneous abscess of left lower limb: Secondary | ICD-10-CM | POA: Diagnosis not present

## 2024-06-04 DIAGNOSIS — L0291 Cutaneous abscess, unspecified: Secondary | ICD-10-CM | POA: Diagnosis not present

## 2024-06-04 DIAGNOSIS — W5501XD Bitten by cat, subsequent encounter: Secondary | ICD-10-CM | POA: Diagnosis not present

## 2024-06-07 DIAGNOSIS — L089 Local infection of the skin and subcutaneous tissue, unspecified: Secondary | ICD-10-CM | POA: Diagnosis not present

## 2024-06-07 DIAGNOSIS — S91052A Open bite, left ankle, initial encounter: Secondary | ICD-10-CM | POA: Diagnosis not present

## 2024-06-07 DIAGNOSIS — A4902 Methicillin resistant Staphylococcus aureus infection, unspecified site: Secondary | ICD-10-CM | POA: Diagnosis not present

## 2024-06-14 ENCOUNTER — Other Ambulatory Visit (HOSPITAL_BASED_OUTPATIENT_CLINIC_OR_DEPARTMENT_OTHER): Payer: Self-pay

## 2024-06-14 ENCOUNTER — Encounter: Payer: Self-pay | Admitting: Oncology

## 2024-06-15 ENCOUNTER — Other Ambulatory Visit (HOSPITAL_BASED_OUTPATIENT_CLINIC_OR_DEPARTMENT_OTHER): Payer: Self-pay

## 2024-06-15 MED ORDER — PREDNISOLONE SODIUM PHOSPHATE 15 MG/5ML PO SOLN
5.0000 mL | Freq: Four times a day (QID) | OROMUCOSAL | 0 refills | Status: AC
  Filled 2024-06-15: qty 240, 12d supply, fill #0

## 2024-06-16 ENCOUNTER — Other Ambulatory Visit (HOSPITAL_BASED_OUTPATIENT_CLINIC_OR_DEPARTMENT_OTHER): Payer: Self-pay

## 2024-07-09 DIAGNOSIS — G43709 Chronic migraine without aura, not intractable, without status migrainosus: Secondary | ICD-10-CM | POA: Diagnosis not present

## 2024-09-07 ENCOUNTER — Ambulatory Visit (HOSPITAL_BASED_OUTPATIENT_CLINIC_OR_DEPARTMENT_OTHER)
Admission: EM | Admit: 2024-09-07 | Discharge: 2024-09-07 | Disposition: A | Discharge status: Home / Self Care | Attending: Family Medicine | Admitting: Family Medicine

## 2024-09-07 ENCOUNTER — Encounter (HOSPITAL_BASED_OUTPATIENT_CLINIC_OR_DEPARTMENT_OTHER): Payer: Self-pay

## 2024-09-07 ENCOUNTER — Other Ambulatory Visit (HOSPITAL_BASED_OUTPATIENT_CLINIC_OR_DEPARTMENT_OTHER): Payer: Self-pay

## 2024-09-07 DIAGNOSIS — R0981 Nasal congestion: Secondary | ICD-10-CM

## 2024-09-07 LAB — POC COVID19/FLU A&B COMBO
Covid Antigen, POC: NEGATIVE
Influenza A Antigen, POC: NEGATIVE
Influenza B Antigen, POC: NEGATIVE

## 2024-09-07 MED ORDER — DOXYCYCLINE HYCLATE 100 MG PO CAPS
100.0000 mg | ORAL_CAPSULE | Freq: Two times a day (BID) | ORAL | 0 refills | Status: AC
  Filled 2024-09-07: qty 20, 10d supply, fill #0

## 2024-09-07 NOTE — ED Provider Notes (Signed)
 [name]    CSN: [id number] Arrival date & time: 09/07/24  0803      History   Chief Complaint Chief Complaint  Patient presents with   Nasal Congestion    HPI Morgan [name] is a 53 y.o. female.   Pt is a 53 year old female that presents with nasal congestion, cough, chest tightness, fatigue, facial pain, HA, chills, body aches, and off and on fever at night for the last 3-4 weeks. She was seen at an UC on 08/21/24- test neg for covid, flu, and strep. She was prescribed amoxicillin- no relief. She has since been seen with pcp who refilled her inhaler and sent in a nasal spray-no relief. They also did an EKG due to chest tightness/pressure- normal. She has been taking sudafed as well with no relief.       Past Medical History:  Diagnosis Date   Acid reflux    Anemia    Anxiety    Depression    Family history of adverse reaction to anesthesia    sister has PONV   Fibromyalgia    H. pylori infection    hx of , none now   Headache    migraines, uses imitrex as needed   Hematuria    History of kidney stones    Hypothyroidism    PONV (postoperative nausea and vomiting)    Renal calculi BILATERAL   Right ureteral stone    RLS (restless legs syndrome)    TBI (traumatic brain injury) (HCC) 04/12/2015    Patient Active Problem List   Diagnosis Date Noted   Hypokalemia 08/01/2022    Class: Diagnosis of   Iron deficiency anemia 07/31/2022    Class: Chronic   B12 deficiency anemia 07/26/2022    Class: Chronic   Chronic pain 07/27/2019   Head trauma 01/29/2018   Major depressive disorder, recurrent, 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   PTSD (post-traumatic stress disorder) 03/13/2016   Chronic post-traumatic stress disorder (PTSD) 03/13/2016   Depression    Suicidal ideation    Ureteral calculus 12/26/2014   Ureteral calculus, right 03/31/2013    Past Surgical History:  Procedure Laterality Date   ABDOMINAL HYSTERECTOMY      CHOLECYSTECTOMY     CYSTOSCOPY WITH RETROGRADE PYELOGRAM, URETEROSCOPY AND STENT PLACEMENT Left 12/26/2014   Procedure: CYSTOSCOPY WITH RETROGRADE PYELOGRAM, URETEROSCOPY AND STENT PLACEMENT;  Surgeon: Alm GORMAN Fragmin, MD;  Location: [facility];  Service: Urology;  Laterality: Left;   CYSTOSCOPY WITH RETROGRADE PYELOGRAM, URETEROSCOPY AND STENT PLACEMENT Left 02/11/2022   Procedure: CYSTOSCOPY WITH RETROGRADE PYELOGRAM, URETEROSCOPY AND STENT PLACEMENT;  Surgeon: Watt Rush, MD;  Location: [facility];  Service: Urology;  Laterality: Left;   CYSTOSCOPY WITH RETROGRADE PYELOGRAM, URETEROSCOPY AND STENT PLACEMENT Right 07/12/2022   Procedure: CYSTOSCOPY WITH RIGHT RETROGRADE PYELOGRAM, URETEROSCOPY HOLMIUM LASER;  Surgeon: Watt Rush, MD;  Location: [facility];  Service: Urology;  Laterality: Right;  60 MINS FOR CASE   CYSTOSCOPY/RETROGRADE/URETEROSCOPY Right 03/31/2013   Procedure: CYSTOSCOPY/RETROGRADE/URETEROSCOPY;  Surgeon: Alm GORMAN Fragmin, MD;  Location: [facility];  Service: Urology;  Laterality: Right;   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   HOLMIUM LASER APPLICATION Right 03/31/2013   Procedure: HOLMIUM LASER APPLICATION;  Surgeon: Alm GORMAN Fragmin, MD;  Location: [facility];  Service: Urology;  Laterality: Right;   HOLMIUM LASER APPLICATION Left 12/26/2014   Procedure: HOLMIUM LASER APPLICATION;  Surgeon: Alm GORMAN Fragmin, MD;  Location: [facility];  Service: Urology;  Laterality: Left;   LAPAROSCOPIC ASSISTED VAGINAL HYSTERECTOMY  MARCH 2014   W/ BILATERAL SALPINGOOPHORECTOMY   LAPAROSCOPIC GASTRIC BANDING  SEPT 2011   TONSILLECTOMY AND ADENOIDECTOMY  AGE 1'S   URETEROLITHOTOMY  NOV 2013    OB History   No obstetric history on file.      Home Medications    Prior to Admission medications   Medication Sig Start Date End Date Taking? Authorizing Provider  doxycycline (VIBRAMYCIN) 100 MG capsule Take 1 capsule (100 mg total) by mouth 2 (two) times  daily. 09/07/24  Yes Kida Digiulio A, FNP  AIMOVIG 70 MG/ML SOAJ Inject 70 mg into the muscle every 30 (thirty) days. 10/13/17   [provider]  alum & mag hydroxide-simeth (MAALOX MAX) 400-400-40 MG/5ML suspension Take 15 mLs by mouth every 6 (six) hours as needed for indigestion. 07/10/22   Kommor, Lum, MD  AMITIZA  24 MCG capsule Take 24 mcg by mouth 2 (two) times daily with a meal.  09/24/17   [provider]  atomoxetine (STRATTERA) 25 MG capsule Take 40 mg by mouth daily. 03/20/23 06/02/24  [provider]  BOTOX 200 units injection Inject 200 Units into the muscle every 3 (three) months. 06/24/22   [provider]  buPROPion  (WELLBUTRIN  XL) 150 MG 24 hr tablet Take 3 tablets (450 mg total) by mouth daily. 06/02/24   Rhys Boyer T, PA-C  busPIRone  (BUSPAR ) 30 MG tablet TAKE 1 TABLET(30 MG) BY MOUTH TWICE DAILY 06/02/24   Rhys Boyer T, PA-C  Cyanocobalamin (B-12 IJ) Inject 1 Dose as directed once a week.    [provider]  cyclobenzaprine  (FLEXERIL ) 10 MG tablet Take 1 tablet (10 mg total) by mouth 3 (three) times daily as needed for muscle spasms. 11/04/18   Shugart, Roslynn, PA-C  diazepam  (VALIUM ) 5 MG tablet Take 1 tablet (5 mg total) by mouth every 12 (twelve) hours as needed for anxiety. 11/29/22   Rhys Boyer T, PA-C  dicyclomine  (BENTYL ) 20 MG tablet Take 1 tablet (20 mg total) by mouth 2 (two) times daily. 04/28/24   Dreama Longs, MD  ENULOSE 10 GM/15ML SOLN Take 20 g by mouth in the morning and at bedtime. 05/09/22   [provider]  fluticasone (FLONASE) 50 MCG/ACT nasal spray Place 1 spray into both nostrils daily as needed for allergies or rhinitis.    [provider]  fluvoxaMINE  (LUVOX ) 100 MG tablet TAKE 2 TABLETS(200 MG) BY MOUTH AT BEDTIME 06/02/24   Hurst, Teresa T, PA-C  gabapentin  (NEURONTIN ) 400 MG capsule TAKE 2 CAPSULES BY MOUTH IN THE MORNING, 2 CAPSULES AT  LUNCH, AND 4 CAPSULES AT  BEDTIME 06/02/24   Hurst,  Teresa T, PA-C  ipratropium-albuterol (DUONEB) 0.5-2.5 (3) MG/3ML SOLN Inhale 3 mLs into the lungs every 6 (six) hours as needed (shortness of breath or wheezing).    [provider]  levothyroxine  (SYNTHROID ) 88 MCG tablet Take 88 mcg by mouth daily before breakfast.    [provider]  linaclotide  (LINZESS ) 290 MCG CAPS capsule Take 290 mcg by mouth daily before breakfast.    [provider]  montelukast  (SINGULAIR ) 10 MG tablet Take 10 mg by mouth at bedtime.  02/05/16   [provider]  nystatin -prednisoLONE -diphenhydrAMINE  Take 5 mLs by mouth 4 (four) times daily as directed. 06/14/24     OLANZapine  (ZYPREXA ) 20 MG tablet Take 1 tablet (20 mg total) by mouth at bedtime. 03/10/24   Rhys Boyer DASEN, PA-C  omeprazole (PRILOSEC) 40 MG capsule Take  40 mg by mouth in the morning and at bedtime. 07/29/17   [provider]  ondansetron  (ZOFRAN -ODT) 4 MG disintegrating tablet Take 1 tablet (4 mg total) by mouth every 8 (eight) hours as needed for nausea or vomiting. 04/28/24   Dreama Longs, MD  oxybutynin (DITROPAN) 5 MG tablet Take 5 mg by mouth 2 (two) times daily. 07/09/22   [provider]  QUEtiapine  (SEROQUEL ) 50 MG tablet Take 1-2 tablets (50-100 mg total) by mouth at bedtime. 03/10/24   Rhys Boyer T, PA-C  tamsulosin  (FLOMAX ) 0.4 MG CAPS capsule Take 1 capsule (0.4 mg total) by mouth daily. 02/10/22   Theotis Peers M, PA-C  topiramate  (TOPAMAX ) 100 MG tablet Take 2 tablets (200 mg total) by mouth 2 (two) times daily. 06/02/24   Rhys Boyer DASEN, PA-C  traMADol  (ULTRAM ) 50 MG tablet Take 100 mg by mouth every 6 (six) hours as needed for moderate pain.    [provider]  traZODone  (DESYREL ) 100 MG tablet Take 1.5 tablets (150 mg total) by mouth at bedtime as needed for sleep. 06/02/24   Rhys Boyer DASEN, PA-C  sucralfate (CARAFATE) 1 g tablet Take by mouth. 07/16/22 07/31/22  [provider]    Family History Family History   Problem Relation Age of Onset   Diabetes Mother    Hypertension Mother    Cancer Mother    Hypertension Father    COPD Father    Deafness Father    Emphysema Father     Social History Social History   Tobacco Use   Smoking status: Never   Smokeless tobacco: Never  Vaping Use   Vaping status: Never Used  Substance Use Topics   Alcohol use: No   Drug use: No     Allergies   Clindamycin/lincomycin, Nsaids, Oxycodone, and Percocet [oxycodone-acetaminophen ]   Review of Systems Review of Systems See HPI  Physical Exam Triage Vital Signs ED Triage Vitals  Encounter Vitals Group     BP 09/07/24 0817 105/70     Girls Systolic BP Percentile --      Girls Diastolic BP Percentile --      Boys Systolic BP Percentile --      Boys Diastolic BP Percentile --      Pulse Rate 09/07/24 0817 72     Resp 09/07/24 0817 20     Temp 09/07/24 0817 97.7 F (36.5 C)     Temp Source 09/07/24 0817 Oral     SpO2 09/07/24 0817 98 %     Weight --      Height --      Head Circumference --      Peak Flow --      Pain Score 09/07/24 0814 10     Pain Loc --      Pain Education --      Exclude from Growth Chart --    No data found.  Updated Vital Signs BP 105/70 (BP Location: Right Arm)   Pulse 72   Temp 97.7 F (36.5 C) (Oral)   Resp 20   SpO2 98%   Visual Acuity Right Eye Distance:   Left Eye Distance:   Bilateral Distance:    Right Eye Near:   Left Eye Near:    Bilateral Near:     Physical Exam Vitals and nursing note reviewed.  Constitutional:      General: She is not in acute distress.    Appearance: Normal appearance. She is not ill-appearing, toxic-appearing or diaphoretic.  HENT:     Head: Normocephalic and atraumatic.     Right Ear: Tympanic membrane and ear canal normal.     Left Ear: Tympanic membrane and ear canal normal.     Nose: Congestion and rhinorrhea present.     Mouth/Throat:     Pharynx: Oropharynx is clear.  Eyes:     Conjunctiva/sclera:  Conjunctivae normal.  Cardiovascular:     Rate and Rhythm: Normal rate and regular rhythm.     Pulses: Normal pulses.     Heart sounds: Normal heart sounds.  Pulmonary:     Effort: Pulmonary effort is normal.     Breath sounds: Normal breath sounds.  Skin:    General: Skin is warm and dry.  Neurological:     Mental Status: She is alert.  Psychiatric:        Mood and Affect: Mood normal.      UC Treatments / Results  Labs (all labs ordered are listed, but only abnormal results are displayed) Labs Reviewed  POC COVID19/FLU A&B COMBO    EKG   Radiology No results found.  Procedures Procedures (including critical care time)  Medications Ordered in UC Medications - No data to display  Initial Impression / Assessment and Plan / UC Course  I have reviewed the triage vital signs and the nursing notes.  Pertinent labs & imaging results that were available during my care of the patient were reviewed by me and considered in my medical decision making (see chart for details).    Sinusitis COVID and flu test were negative.  I am treating you for a sinus infection.  Take the doxycycline as prescribed.  You can continue over-the-counter medications like Mucinex sinus for your symptoms.  Please follow-up with your doctor for any continued issues Final Clinical Impressions(s) / UC Diagnoses   Final diagnoses:  Nasal congestion     Discharge Instructions      Your COVID and flu test were negative.  I am treating you for a sinus infection.  Take the doxycycline as prescribed.  You can continue over-the-counter medications like Mucinex sinus for your symptoms.  Please follow-up with your doctor for any continued issues     ED Prescriptions     Medication Sig Dispense Auth. Provider   doxycycline (VIBRAMYCIN) 100 MG capsule Take 1 capsule (100 mg total) by mouth 2 (two) times daily. 20 capsule Adah Wilbert LABOR, FNP      PDMP not reviewed this encounter.   Adah Wilbert LABOR,  FNP 09/07/24 [phone]

## 2024-09-07 NOTE — ED Triage Notes (Signed)
 Pt states she has been having nasal congestion, cough, chest tightness, fatigue, facial pain, HA, chills, body aches, and off and on fever at night for the last 3-4 weeks. She was seen at an UC on 08/21/24- test neg for covid, flu, and strep. She was prescribed amoxicillin- no relief. She has since been seen with pcp who refilled her inhaler and sent in a nasal spray-no relief. They also did an EKG due to chest tightness/pressure- normal. She has been taking sudafed as well with no relief.

## 2024-09-07 NOTE — Discharge Instructions (Addendum)
 Your COVID and flu test were negative.  I am treating you for a sinus infection.  Take the doxycycline as prescribed.  You can continue over-the-counter medications like Mucinex sinus for your symptoms.  Please follow-up with your doctor for any continued issues

## 2024-12-01 ENCOUNTER — Encounter: Payer: Self-pay | Admitting: Physician Assistant

## 2024-12-01 ENCOUNTER — Ambulatory Visit: Admitting: Physician Assistant

## 2024-12-01 DIAGNOSIS — F319 Bipolar disorder, unspecified: Secondary | ICD-10-CM | POA: Diagnosis not present

## 2024-12-01 DIAGNOSIS — F900 Attention-deficit hyperactivity disorder, predominantly inattentive type: Secondary | ICD-10-CM | POA: Diagnosis not present

## 2024-12-01 DIAGNOSIS — G4700 Insomnia, unspecified: Secondary | ICD-10-CM | POA: Diagnosis not present

## 2024-12-01 DIAGNOSIS — F4310 Post-traumatic stress disorder, unspecified: Secondary | ICD-10-CM

## 2024-12-01 DIAGNOSIS — F411 Generalized anxiety disorder: Secondary | ICD-10-CM | POA: Diagnosis not present

## 2024-12-01 MED ORDER — DIAZEPAM 5 MG PO TABS: 5.0000 mg | ORAL_TABLET | Freq: Two times a day (BID) | ORAL | 2 refills | Status: AC | PRN

## 2024-12-01 NOTE — Progress Notes (Signed)
 "     [facility] Check  Patient ID: Morgan [name],  MRN: [id number]  PCP: Hunter Mickey Browner, PA-C  Date of Evaluation: 12/01/2024 Time spent:20 minutes  Chief Complaint:  Chief Complaint   Anxiety; Depression; ADD; Insomnia; Follow-up    HISTORY/CURRENT STATUS: HPI For routine med check.  For the most part, she's stable. Gets frustrated w/ her husband b/c he doesn't respect her.  Discord sometimes w/ his kids, but they work through it.  No extreme sadness, tearfulness, or feelings of hopelessness.   ADLs and personal hygiene are normal.   Denies any changes in concentration, making decisions, or remembering things.  Appetite has not changed.  Weight is stable.   Occas she doesn't sleep well but not too often. Feels like the Traz usually works well.  Anxiety is controlled.  Rarely takes the Valium .  No SI/HI.  No reports of increased energy with decreased need for sleep, increased talkativeness, racing thoughts, impulsivity or risky behaviors, increased spending, grandiosity, increased irritability or anger, paranoia, or hallucinations.  Individual Medical History/ Review of Systems: Changes? :No     Past medications for mental health diagnoses include: Zoloft , trazodone , gabapentin , Risperdal, hydroxyzine , prazosin , nortriptyline, Adderall, Rexulti, Topamax , Latuda, Ingrezza  lithium , Valium , clozapine, BuSpar , Zyprexa   Allergies: Clindamycin/lincomycin, Nsaids, Oxycodone, and Percocet [oxycodone-acetaminophen ]  Current Medications:  Current Outpatient Medications:    AIMOVIG 70 MG/ML SOAJ, Inject 70 mg into the muscle every 30 (thirty) days., Disp: , Rfl: 11   alum & mag hydroxide-simeth (MAALOX MAX) 400-400-40 MG/5ML suspension, Take 15 mLs by mouth every 6 (six) hours as needed for indigestion., Disp: 355 mL, Rfl: 0   AMITIZA  24 MCG capsule, Take 24 mcg by mouth 2 (two) times daily with a meal. , Disp: , Rfl:    atomoxetine (STRATTERA) 25 MG capsule, Take 40 mg by mouth  daily., Disp: , Rfl:    BOTOX 200 units injection, Inject 200 Units into the muscle every 3 (three) months., Disp: , Rfl:    buPROPion  (WELLBUTRIN  XL) 150 MG 24 hr tablet, Take 3 tablets (450 mg total) by mouth daily., Disp: 270 tablet, Rfl: 3   busPIRone  (BUSPAR ) 30 MG tablet, TAKE 1 TABLET(30 MG) BY MOUTH TWICE DAILY, Disp: 180 tablet, Rfl: 3   Cyanocobalamin  (B-12 IJ), Inject 1 Dose as directed once a week., Disp: , Rfl:    cyclobenzaprine  (FLEXERIL ) 10 MG tablet, Take 1 tablet (10 mg total) by mouth 3 (three) times daily as needed for muscle spasms., Disp: 270 tablet, Rfl: 0   dicyclomine  (BENTYL ) 20 MG tablet, Take 1 tablet (20 mg total) by mouth 2 (two) times daily., Disp: 20 tablet, Rfl: 0   doxycycline  (VIBRAMYCIN ) 100 MG capsule, Take 1 capsule (100 mg total) by mouth 2 (two) times daily., Disp: 20 capsule, Rfl: 0   ENULOSE 10 GM/15ML SOLN, Take 20 g by mouth in the morning and at bedtime., Disp: , Rfl:    fluticasone (FLONASE) 50 MCG/ACT nasal spray, Place 1 spray into both nostrils daily as needed for allergies or rhinitis., Disp: , Rfl:    fluvoxaMINE  (LUVOX ) 100 MG tablet, TAKE 2 TABLETS(200 MG) BY MOUTH AT BEDTIME, Disp: 180 tablet, Rfl: 3   gabapentin  (NEURONTIN ) 400 MG capsule, TAKE 2 CAPSULES BY MOUTH IN THE MORNING, 2 CAPSULES AT  LUNCH, AND 4 CAPSULES AT  BEDTIME, Disp: 720 capsule, Rfl: 3   ipratropium-albuterol (DUONEB) 0.5-2.5 (3) MG/3ML SOLN, Inhale 3 mLs into the lungs every 6 (six) hours as needed (shortness of breath  or wheezing)., Disp: , Rfl:    levothyroxine  (SYNTHROID ) 88 MCG tablet, Take 88 mcg by mouth daily before breakfast., Disp: , Rfl:    linaclotide  (LINZESS ) 290 MCG CAPS capsule, Take 290 mcg by mouth daily before breakfast., Disp: , Rfl:    montelukast  (SINGULAIR ) 10 MG tablet, Take 10 mg by mouth at bedtime. , Disp: , Rfl:    nystatin -prednisoLONE -diphenhydrAMINE , Take 5 mLs by mouth 4 (four) times daily as directed., Disp: 240 mL, Rfl: 0   OLANZapine   (ZYPREXA ) 20 MG tablet, Take 1 tablet (20 mg total) by mouth at bedtime., Disp: 90 tablet, Rfl: 3   omeprazole (PRILOSEC) 40 MG capsule, Take 40 mg by mouth in the morning and at bedtime., Disp: , Rfl:    ondansetron  (ZOFRAN -ODT) 4 MG disintegrating tablet, Take 1 tablet (4 mg total) by mouth every 8 (eight) hours as needed for nausea or vomiting., Disp: 20 tablet, Rfl: 0   oxybutynin (DITROPAN) 5 MG tablet, Take 5 mg by mouth 2 (two) times daily., Disp: , Rfl:    QUEtiapine  (SEROQUEL ) 50 MG tablet, Take 1-2 tablets (50-100 mg total) by mouth at bedtime., Disp: 180 tablet, Rfl: 3   tamsulosin  (FLOMAX ) 0.4 MG CAPS capsule, Take 1 capsule (0.4 mg total) by mouth daily., Disp: 30 capsule, Rfl: 0   topiramate  (TOPAMAX ) 100 MG tablet, Take 2 tablets (200 mg total) by mouth 2 (two) times daily., Disp: 360 tablet, Rfl: 3   traMADol  (ULTRAM ) 50 MG tablet, Take 100 mg by mouth every 6 (six) hours as needed for moderate pain., Disp: , Rfl:    traZODone  (DESYREL ) 100 MG tablet, Take 1.5 tablets (150 mg total) by mouth at bedtime as needed for sleep., Disp: 135 tablet, Rfl: 3   diazepam  (VALIUM ) 5 MG tablet, Take 1 tablet (5 mg total) by mouth every 12 (twelve) hours as needed for anxiety., Disp: 60 tablet, Rfl: 2 Medication Side Effects: none  Family Medical/ Social History: Changes?  No  MENTAL HEALTH EXAM:  There were no vitals taken for this visit.There is no height or weight on file to calculate BMI.    General Appearance: Casual and Well Groomed  Eye Contact:  Good  Speech:  Clear and Coherent and Normal Rate  Volume:  Normal  Mood:  Euthymic  Affect:  Congruent  Thought Process:  Goal Directed and Descriptions of Associations: Intact  Orientation:  Full (Time, Place, and Person)  Thought Content: Logical   Suicidal Thoughts:  No  Homicidal Thoughts:  No  Memory:  WNL  Judgement:  Good  Insight:  Good  Psychomotor Activity:  Normal  Concentration:  Concentration: Good and Attention Span:  Good  Recall:  Good  Fund of Knowledge: Good  Language: Good  Assets:  Communication Skills Desire for Improvement Financial Resources/Insurance Housing Leisure Time Resilience Transportation  ADL's:  Intact  Cognition: WNL  Prognosis:  Good   PCP follows labs   DIAGNOSES:    ICD-10-CM   1. Bipolar I disorder (HCC)  F31.9     2. Generalized anxiety disorder  F41.1     3. PTSD (post-traumatic stress disorder)  F43.10     4. Insomnia, unspecified type  G47.00     5. Attention deficit hyperactivity disorder (ADHD), predominantly inattentive type  F90.0      Receiving Psychotherapy: Yes   Devere Sharps  RECOMMENDATIONS:  PDMP was reviewed.   Gabapentin  filled 06/22/2024. I provided approximately 20 minutes of face to face time during this encounter, including time  spent before and after the visit in records review, medical decision making, counseling pertinent to today's visit, and charting.   As far as her mental health meds go, she's stable so no changes are needed.   Continue Strattera 25 mg daily per PCP.   Continue Wellbutrin  XL 450 mg p.o. every morning. Continue BuSpar  30 mg, 1 p.o. twice daily. Continue Valium  5 mg, 1 p.o. twice daily as needed.  Rarely takes.  Continue Luvox  100 mg, 2 p.o. nightly. Continue gabapentin  400 mg, 2 p.o. every morning, 2 p.o. at lunch, and 4 p.o. nightly.  Continue Zyprexa  20 mg, 1 p.o. nightly. Continue Seroquel   50 mg, 1-2 p.o. nightly.  (For sleep) Continue Topamax  100 mg, 2 po bid.  Continue trazodone  100 mg, 1.5 pills nightly as needed. Continue therapy. Return in 6 months.   Verneita Cooks, PA-C  "

## 2025-06-01 ENCOUNTER — Ambulatory Visit: Admitting: Physician Assistant
